# Patient Record
Sex: Female | Born: 1945
Health system: Southern US, Community
[De-identification: ages and names within clinical notes are randomized; demographics above are authoritative.]

## PROBLEM LIST (undated history)

## (undated) DIAGNOSIS — M8580 Other specified disorders of bone density and structure, unspecified site: Secondary | ICD-10-CM

## (undated) DIAGNOSIS — Z923 Personal history of irradiation: Secondary | ICD-10-CM

## (undated) DIAGNOSIS — Z808 Family history of malignant neoplasm of other organs or systems: Secondary | ICD-10-CM

## (undated) DIAGNOSIS — M1990 Unspecified osteoarthritis, unspecified site: Secondary | ICD-10-CM

## (undated) DIAGNOSIS — R519 Headache, unspecified: Secondary | ICD-10-CM

## (undated) DIAGNOSIS — F419 Anxiety disorder, unspecified: Secondary | ICD-10-CM

## (undated) DIAGNOSIS — F32A Depression, unspecified: Secondary | ICD-10-CM

## (undated) DIAGNOSIS — C801 Malignant (primary) neoplasm, unspecified: Secondary | ICD-10-CM

## (undated) DIAGNOSIS — G4700 Insomnia, unspecified: Secondary | ICD-10-CM

## (undated) DIAGNOSIS — K589 Irritable bowel syndrome without diarrhea: Secondary | ICD-10-CM

## (undated) DIAGNOSIS — K635 Polyp of colon: Secondary | ICD-10-CM

## (undated) DIAGNOSIS — I10 Essential (primary) hypertension: Secondary | ICD-10-CM

## (undated) DIAGNOSIS — Z800 Family history of malignant neoplasm of digestive organs: Secondary | ICD-10-CM

## (undated) DIAGNOSIS — C50919 Malignant neoplasm of unspecified site of unspecified female breast: Secondary | ICD-10-CM

## (undated) DIAGNOSIS — K219 Gastro-esophageal reflux disease without esophagitis: Secondary | ICD-10-CM

## (undated) DIAGNOSIS — G8929 Other chronic pain: Secondary | ICD-10-CM

## (undated) DIAGNOSIS — Z803 Family history of malignant neoplasm of breast: Secondary | ICD-10-CM

## (undated) DIAGNOSIS — Z720 Tobacco use: Secondary | ICD-10-CM

## (undated) DIAGNOSIS — R51 Headache: Secondary | ICD-10-CM

## (undated) DIAGNOSIS — F329 Major depressive disorder, single episode, unspecified: Secondary | ICD-10-CM

## (undated) HISTORY — DX: Family history of malignant neoplasm of breast: Z80.3

## (undated) HISTORY — DX: Irritable bowel syndrome, unspecified: K58.9

## (undated) HISTORY — DX: Tobacco use: Z72.0

## (undated) HISTORY — PX: ABDOMINAL HYSTERECTOMY: SHX81

## (undated) HISTORY — DX: Family history of malignant neoplasm of other organs or systems: Z80.8

## (undated) HISTORY — DX: Depression, unspecified: F32.A

## (undated) HISTORY — DX: Headache, unspecified: R51.9

## (undated) HISTORY — DX: Anxiety disorder, unspecified: F41.9

## (undated) HISTORY — DX: Gastro-esophageal reflux disease without esophagitis: K21.9

## (undated) HISTORY — DX: Insomnia, unspecified: G47.00

## (undated) HISTORY — DX: Family history of malignant neoplasm of digestive organs: Z80.0

## (undated) HISTORY — DX: Unspecified osteoarthritis, unspecified site: M19.90

## (undated) HISTORY — PX: ENDOMETRIAL BIOPSY: SHX622

## (undated) HISTORY — DX: Headache: R51

## (undated) HISTORY — DX: Other chronic pain: G89.29

## (undated) HISTORY — DX: Major depressive disorder, single episode, unspecified: F32.9

## (undated) HISTORY — DX: Polyp of colon: K63.5

## (undated) HISTORY — DX: Malignant (primary) neoplasm, unspecified: C80.1

---

## 1982-10-12 HISTORY — PX: LAPAROSCOPY: SHX197

## 1998-07-10 ENCOUNTER — Encounter: Payer: Self-pay | Admitting: Internal Medicine

## 1998-07-19 ENCOUNTER — Ambulatory Visit (HOSPITAL_COMMUNITY): Admission: RE | Admit: 1998-07-19 | Discharge: 1998-07-19 | Payer: Self-pay | Admitting: Internal Medicine

## 1998-09-17 ENCOUNTER — Ambulatory Visit (HOSPITAL_COMMUNITY): Admission: RE | Admit: 1998-09-17 | Discharge: 1998-09-17 | Payer: Self-pay | Admitting: Obstetrics and Gynecology

## 1998-09-17 ENCOUNTER — Encounter: Payer: Self-pay | Admitting: Obstetrics and Gynecology

## 1999-06-09 ENCOUNTER — Other Ambulatory Visit: Admission: RE | Admit: 1999-06-09 | Discharge: 1999-06-09 | Payer: Self-pay | Admitting: Obstetrics and Gynecology

## 1999-06-18 ENCOUNTER — Ambulatory Visit (HOSPITAL_COMMUNITY): Admission: RE | Admit: 1999-06-18 | Discharge: 1999-06-18 | Payer: Self-pay | Admitting: Obstetrics and Gynecology

## 1999-06-18 ENCOUNTER — Encounter: Payer: Self-pay | Admitting: Obstetrics and Gynecology

## 1999-11-11 ENCOUNTER — Encounter: Payer: Self-pay | Admitting: Obstetrics and Gynecology

## 1999-11-11 ENCOUNTER — Ambulatory Visit (HOSPITAL_COMMUNITY): Admission: RE | Admit: 1999-11-11 | Discharge: 1999-11-11 | Payer: Self-pay | Admitting: Obstetrics and Gynecology

## 2000-11-24 ENCOUNTER — Encounter: Payer: Self-pay | Admitting: Obstetrics and Gynecology

## 2000-11-24 ENCOUNTER — Ambulatory Visit (HOSPITAL_COMMUNITY): Admission: RE | Admit: 2000-11-24 | Discharge: 2000-11-24 | Payer: Self-pay | Admitting: Obstetrics and Gynecology

## 2002-02-23 ENCOUNTER — Encounter: Payer: Self-pay | Admitting: Family Medicine

## 2002-02-23 ENCOUNTER — Ambulatory Visit (HOSPITAL_COMMUNITY): Admission: RE | Admit: 2002-02-23 | Discharge: 2002-02-23 | Payer: Self-pay | Admitting: Family Medicine

## 2002-08-08 ENCOUNTER — Other Ambulatory Visit: Admission: RE | Admit: 2002-08-08 | Discharge: 2002-08-08 | Payer: Self-pay | Admitting: Family Medicine

## 2002-09-14 LAB — FECAL OCCULT BLOOD, GUAIAC: Fecal Occult Blood: NEGATIVE

## 2003-02-15 ENCOUNTER — Encounter: Payer: Self-pay | Admitting: Family Medicine

## 2003-02-15 ENCOUNTER — Encounter: Admission: RE | Admit: 2003-02-15 | Discharge: 2003-02-15 | Payer: Self-pay | Admitting: Family Medicine

## 2004-12-25 ENCOUNTER — Ambulatory Visit: Payer: Self-pay | Admitting: Internal Medicine

## 2005-01-02 ENCOUNTER — Ambulatory Visit: Payer: Self-pay | Admitting: Internal Medicine

## 2005-02-17 ENCOUNTER — Ambulatory Visit: Payer: Self-pay | Admitting: Family Medicine

## 2005-05-01 ENCOUNTER — Other Ambulatory Visit: Admission: RE | Admit: 2005-05-01 | Discharge: 2005-05-01 | Payer: Self-pay | Admitting: Family Medicine

## 2005-05-01 ENCOUNTER — Ambulatory Visit: Payer: Self-pay | Admitting: Family Medicine

## 2005-05-26 ENCOUNTER — Encounter: Admission: RE | Admit: 2005-05-26 | Discharge: 2005-05-26 | Payer: Self-pay | Admitting: Family Medicine

## 2005-06-05 ENCOUNTER — Encounter: Admission: RE | Admit: 2005-06-05 | Discharge: 2005-06-05 | Payer: Self-pay | Admitting: Family Medicine

## 2006-05-04 ENCOUNTER — Ambulatory Visit: Payer: Self-pay | Admitting: Family Medicine

## 2006-06-07 ENCOUNTER — Encounter: Admission: RE | Admit: 2006-06-07 | Discharge: 2006-06-07 | Payer: Self-pay | Admitting: Family Medicine

## 2006-06-12 ENCOUNTER — Ambulatory Visit: Payer: Self-pay | Admitting: Family Medicine

## 2006-08-17 ENCOUNTER — Ambulatory Visit: Payer: Self-pay | Admitting: Family Medicine

## 2006-12-26 ENCOUNTER — Emergency Department (HOSPITAL_COMMUNITY): Admission: EM | Admit: 2006-12-26 | Discharge: 2006-12-26 | Payer: Self-pay | Admitting: Family Medicine

## 2007-02-11 ENCOUNTER — Ambulatory Visit: Payer: Self-pay | Admitting: Family Medicine

## 2007-02-11 ENCOUNTER — Encounter: Payer: Self-pay | Admitting: Family Medicine

## 2007-02-11 DIAGNOSIS — F411 Generalized anxiety disorder: Secondary | ICD-10-CM

## 2007-02-11 DIAGNOSIS — G4700 Insomnia, unspecified: Secondary | ICD-10-CM | POA: Insufficient documentation

## 2007-02-11 DIAGNOSIS — F17200 Nicotine dependence, unspecified, uncomplicated: Secondary | ICD-10-CM

## 2007-02-11 DIAGNOSIS — K219 Gastro-esophageal reflux disease without esophagitis: Secondary | ICD-10-CM

## 2007-02-11 DIAGNOSIS — F418 Other specified anxiety disorders: Secondary | ICD-10-CM | POA: Insufficient documentation

## 2007-02-11 DIAGNOSIS — N6019 Diffuse cystic mastopathy of unspecified breast: Secondary | ICD-10-CM

## 2007-05-02 ENCOUNTER — Telehealth: Payer: Self-pay | Admitting: Family Medicine

## 2007-06-10 ENCOUNTER — Encounter: Admission: RE | Admit: 2007-06-10 | Discharge: 2007-06-10 | Payer: Self-pay | Admitting: Family Medicine

## 2007-06-14 ENCOUNTER — Encounter (INDEPENDENT_AMBULATORY_CARE_PROVIDER_SITE_OTHER): Payer: Self-pay | Admitting: *Deleted

## 2007-07-20 ENCOUNTER — Ambulatory Visit: Payer: Self-pay | Admitting: Family Medicine

## 2007-07-20 DIAGNOSIS — M8580 Other specified disorders of bone density and structure, unspecified site: Secondary | ICD-10-CM

## 2007-07-21 LAB — CONVERTED CEMR LAB
ALT: 14 units/L (ref 0–35)
AST: 20 units/L (ref 0–37)
BUN: 11 mg/dL (ref 6–23)
Basophils Absolute: 0.1 10*3/uL (ref 0.0–0.1)
Basophils Relative: 0.8 % (ref 0.0–1.0)
CO2: 32 meq/L (ref 19–32)
Eosinophils Absolute: 0.2 10*3/uL (ref 0.0–0.6)
Eosinophils Relative: 3 % (ref 0.0–5.0)
GFR calc Af Amer: 94 mL/min
GFR calc non Af Amer: 78 mL/min
Glucose, Bld: 104 mg/dL — ABNORMAL HIGH (ref 70–99)
HCT: 42.2 % (ref 36.0–46.0)
Hemoglobin: 14.3 g/dL (ref 12.0–15.0)
LDL Cholesterol: 99 mg/dL (ref 0–99)
Lymphocytes Relative: 21.6 % (ref 12.0–46.0)
MCV: 92.1 fL (ref 78.0–100.0)
Monocytes Absolute: 0.8 10*3/uL — ABNORMAL HIGH (ref 0.2–0.7)
Monocytes Relative: 10.9 % (ref 3.0–11.0)
Neutro Abs: 4.5 10*3/uL (ref 1.4–7.7)
Neutrophils Relative %: 63.7 % (ref 43.0–77.0)
Platelets: 256 10*3/uL (ref 150–400)
RDW: 12.4 % (ref 11.5–14.6)
Sodium: 142 meq/L (ref 135–145)

## 2007-07-28 ENCOUNTER — Encounter: Admission: RE | Admit: 2007-07-28 | Discharge: 2007-07-28 | Payer: Self-pay | Admitting: Family Medicine

## 2007-07-28 ENCOUNTER — Encounter: Payer: Self-pay | Admitting: Family Medicine

## 2007-10-01 ENCOUNTER — Ambulatory Visit: Payer: Self-pay | Admitting: Internal Medicine

## 2008-04-20 ENCOUNTER — Telehealth: Payer: Self-pay | Admitting: Family Medicine

## 2008-06-11 ENCOUNTER — Encounter: Admission: RE | Admit: 2008-06-11 | Discharge: 2008-06-11 | Payer: Self-pay | Admitting: Family Medicine

## 2008-06-13 ENCOUNTER — Encounter (INDEPENDENT_AMBULATORY_CARE_PROVIDER_SITE_OTHER): Payer: Self-pay | Admitting: *Deleted

## 2008-07-12 HISTORY — PX: OTHER SURGICAL HISTORY: SHX169

## 2008-07-17 ENCOUNTER — Ambulatory Visit: Payer: Self-pay | Admitting: Family Medicine

## 2008-07-18 LAB — CONVERTED CEMR LAB
ALT: 14 units/L (ref 0–35)
Alkaline Phosphatase: 72 units/L (ref 39–117)
BUN: 11 mg/dL (ref 6–23)
Basophils Absolute: 0.1 10*3/uL (ref 0.0–0.1)
Bilirubin, Direct: 0.1 mg/dL (ref 0.0–0.3)
Chloride: 104 meq/L (ref 96–112)
Eosinophils Absolute: 0.4 10*3/uL (ref 0.0–0.7)
Eosinophils Relative: 5.6 % — ABNORMAL HIGH (ref 0.0–5.0)
GFR calc Af Amer: 94 mL/min
MCV: 94 fL (ref 78.0–100.0)
Neutro Abs: 3 10*3/uL (ref 1.4–7.7)
Neutrophils Relative %: 46.6 % (ref 43.0–77.0)
Platelets: 231 10*3/uL (ref 150–400)
Sodium: 143 meq/L (ref 135–145)
Total Bilirubin: 0.6 mg/dL (ref 0.3–1.2)
Total CHOL/HDL Ratio: 3.2
Total Protein: 6.5 g/dL (ref 6.0–8.3)

## 2008-07-25 ENCOUNTER — Ambulatory Visit: Payer: Self-pay | Admitting: Family Medicine

## 2008-07-25 DIAGNOSIS — R634 Abnormal weight loss: Secondary | ICD-10-CM

## 2008-07-25 DIAGNOSIS — R109 Unspecified abdominal pain: Secondary | ICD-10-CM | POA: Insufficient documentation

## 2008-07-28 ENCOUNTER — Encounter: Payer: Self-pay | Admitting: Family Medicine

## 2008-08-01 LAB — CONVERTED CEMR LAB
Dopamine 24 Hr Urine: 329 mcg/24hr (ref <500)
Norepinephrine 24 Hr Urine: 91 mcg/24hr — ABNORMAL HIGH (ref <80)

## 2008-08-02 DIAGNOSIS — R930 Abnormal findings on diagnostic imaging of skull and head, not elsewhere classified: Secondary | ICD-10-CM

## 2008-08-07 ENCOUNTER — Ambulatory Visit: Payer: Self-pay | Admitting: Cardiovascular Disease

## 2008-08-22 ENCOUNTER — Ambulatory Visit: Payer: Self-pay | Admitting: Family Medicine

## 2008-08-22 ENCOUNTER — Encounter: Payer: Self-pay | Admitting: Family Medicine

## 2008-08-22 ENCOUNTER — Other Ambulatory Visit: Admission: RE | Admit: 2008-08-22 | Discharge: 2008-08-22 | Payer: Self-pay | Admitting: Family Medicine

## 2008-08-22 LAB — HM PAP SMEAR

## 2009-03-12 ENCOUNTER — Telehealth (INDEPENDENT_AMBULATORY_CARE_PROVIDER_SITE_OTHER): Payer: Self-pay | Admitting: *Deleted

## 2009-03-13 ENCOUNTER — Ambulatory Visit: Payer: Self-pay | Admitting: Family Medicine

## 2009-03-13 DIAGNOSIS — K5289 Other specified noninfective gastroenteritis and colitis: Secondary | ICD-10-CM

## 2009-03-13 DIAGNOSIS — M549 Dorsalgia, unspecified: Secondary | ICD-10-CM | POA: Insufficient documentation

## 2009-03-13 LAB — CONVERTED CEMR LAB
Bilirubin Urine: NEGATIVE
Glucose, Urine, Semiquant: NEGATIVE
Ketones, urine, test strip: NEGATIVE
Nitrite: NEGATIVE
Specific Gravity, Urine: 1.01

## 2009-03-29 ENCOUNTER — Telehealth: Payer: Self-pay | Admitting: Family Medicine

## 2009-06-12 ENCOUNTER — Encounter: Admission: RE | Admit: 2009-06-12 | Discharge: 2009-06-12 | Payer: Self-pay | Admitting: Family Medicine

## 2009-06-14 ENCOUNTER — Encounter (INDEPENDENT_AMBULATORY_CARE_PROVIDER_SITE_OTHER): Payer: Self-pay | Admitting: *Deleted

## 2009-08-07 ENCOUNTER — Ambulatory Visit: Payer: Self-pay | Admitting: Family Medicine

## 2009-08-07 DIAGNOSIS — J069 Acute upper respiratory infection, unspecified: Secondary | ICD-10-CM | POA: Insufficient documentation

## 2009-09-03 ENCOUNTER — Telehealth (INDEPENDENT_AMBULATORY_CARE_PROVIDER_SITE_OTHER): Payer: Self-pay | Admitting: Internal Medicine

## 2009-12-30 ENCOUNTER — Encounter (INDEPENDENT_AMBULATORY_CARE_PROVIDER_SITE_OTHER): Payer: Self-pay | Admitting: *Deleted

## 2010-01-02 ENCOUNTER — Encounter (INDEPENDENT_AMBULATORY_CARE_PROVIDER_SITE_OTHER): Payer: Self-pay | Admitting: *Deleted

## 2010-02-18 DIAGNOSIS — Z860100 Personal history of colon polyps, unspecified: Secondary | ICD-10-CM | POA: Insufficient documentation

## 2010-02-20 ENCOUNTER — Ambulatory Visit: Payer: Self-pay | Admitting: Internal Medicine

## 2010-02-21 ENCOUNTER — Ambulatory Visit: Payer: Self-pay | Admitting: Internal Medicine

## 2010-02-21 LAB — CONVERTED CEMR LAB
AST: 28 units/L (ref 0–37)
BUN: 17 mg/dL (ref 6–23)
Basophils Absolute: 0 10*3/uL (ref 0.0–0.1)
Basophils Relative: 0.4 % (ref 0.0–3.0)
CO2: 33 meq/L — ABNORMAL HIGH (ref 19–32)
Chloride: 100 meq/L (ref 96–112)
Eosinophils Absolute: 0.2 10*3/uL (ref 0.0–0.7)
Eosinophils Relative: 2.8 % (ref 0.0–5.0)
Glucose, Bld: 97 mg/dL (ref 70–99)
Hemoglobin: 16 g/dL — ABNORMAL HIGH (ref 12.0–15.0)
Lymphocytes Relative: 29.9 % (ref 12.0–46.0)
MCHC: 35 g/dL (ref 30.0–36.0)
Monocytes Absolute: 0.6 10*3/uL (ref 0.1–1.0)
Neutrophils Relative %: 57.9 % (ref 43.0–77.0)
Potassium: 4.3 meq/L (ref 3.5–5.1)
RBC: 4.88 M/uL (ref 3.87–5.11)
RDW: 13.3 % (ref 11.5–14.6)
Sodium: 143 meq/L (ref 135–145)
TSH: 1.54 microintl units/mL (ref 0.35–5.50)

## 2010-02-21 LAB — HM COLONOSCOPY

## 2010-02-24 ENCOUNTER — Encounter: Payer: Self-pay | Admitting: Internal Medicine

## 2010-02-24 ENCOUNTER — Telehealth: Payer: Self-pay | Admitting: Internal Medicine

## 2010-06-13 ENCOUNTER — Encounter: Admission: RE | Admit: 2010-06-13 | Discharge: 2010-06-13 | Payer: Self-pay | Admitting: Family Medicine

## 2010-06-18 ENCOUNTER — Encounter: Payer: Self-pay | Admitting: Family Medicine

## 2010-07-21 ENCOUNTER — Ambulatory Visit: Payer: Self-pay | Admitting: Family Medicine

## 2010-07-21 DIAGNOSIS — E7800 Pure hypercholesterolemia, unspecified: Secondary | ICD-10-CM

## 2010-07-21 LAB — CONVERTED CEMR LAB
ALT: 15 units/L (ref 0–35)
Alkaline Phosphatase: 65 units/L (ref 39–117)
BUN: 19 mg/dL (ref 6–23)
Basophils Relative: 0.9 % (ref 0.0–3.0)
Chloride: 103 meq/L (ref 96–112)
Cholesterol: 185 mg/dL (ref 0–200)
GFR calc non Af Amer: 64.73 mL/min (ref >60)
Glucose, Bld: 103 mg/dL — ABNORMAL HIGH (ref 70–99)
HCT: 42.4 % (ref 36.0–46.0)
HDL: 61.8 mg/dL (ref >39.00)
MCHC: 34.8 g/dL (ref 30.0–36.0)
RDW: 13 % (ref 11.5–14.6)
Total Bilirubin: 0.4 mg/dL (ref 0.3–1.2)
Total CHOL/HDL Ratio: 3
Total Protein: 6.4 g/dL (ref 6.0–8.3)
VLDL: 11.6 mg/dL (ref 0.0–40.0)

## 2010-07-22 ENCOUNTER — Telehealth (INDEPENDENT_AMBULATORY_CARE_PROVIDER_SITE_OTHER): Payer: Self-pay | Admitting: *Deleted

## 2010-07-22 LAB — CONVERTED CEMR LAB: Vit D, 25-Hydroxy: 48 ng/mL (ref 30–89)

## 2010-07-24 ENCOUNTER — Ambulatory Visit: Payer: Self-pay | Admitting: Family Medicine

## 2010-07-24 DIAGNOSIS — B351 Tinea unguium: Secondary | ICD-10-CM | POA: Insufficient documentation

## 2010-10-30 ENCOUNTER — Ambulatory Visit
Admission: RE | Admit: 2010-10-30 | Discharge: 2010-10-30 | Payer: Self-pay | Source: Home / Self Care | Attending: Family Medicine | Admitting: Family Medicine

## 2010-10-30 DIAGNOSIS — R300 Dysuria: Secondary | ICD-10-CM | POA: Insufficient documentation

## 2010-10-30 DIAGNOSIS — J019 Acute sinusitis, unspecified: Secondary | ICD-10-CM | POA: Insufficient documentation

## 2010-10-30 DIAGNOSIS — N3000 Acute cystitis without hematuria: Secondary | ICD-10-CM | POA: Insufficient documentation

## 2010-10-30 LAB — CONVERTED CEMR LAB
Bilirubin Urine: NEGATIVE
Glucose, Urine, Semiquant: NEGATIVE
Nitrite: POSITIVE
Protein, U semiquant: 30
Specific Gravity, Urine: <1.005
Urobilinogen, UA: 0.2

## 2010-10-31 ENCOUNTER — Encounter: Payer: Self-pay | Admitting: Family Medicine

## 2010-11-02 ENCOUNTER — Encounter: Payer: Self-pay | Admitting: Family Medicine

## 2010-11-09 LAB — CONVERTED CEMR LAB
Casts: 0 /lpf
Epithelial cells, urine: 1 /lpf
Nitrite: NEGATIVE
Protein, U semiquant: NEGATIVE
Specific Gravity, Urine: <1.005
WBC, UA: 0 cells/hpf
Yeast, UA: 0

## 2010-11-13 NOTE — Progress Notes (Signed)
Summary: Triage   Phone Note Call from Patient Call back at Home Phone [phone]   Caller: Patient Call For: Dr. Juanda Chance Reason for Call: Talk to Nurse Summary of Call: pt. had a procedure on Fri. and is having pain and diarrhea. Wants to know if she can take some Imodium Initial call taken by: Karna Christmas,  Feb 24, 2010 12:27 PM  Follow-up for Phone Call        A.Willis,Rn spoke with patient,still having diarrhea and abd. pain,"everything i eat comes threw liquid form",she is asking if she can take the immodium for diarrhea and tylenol for pain. Patient questions if she should take immodium if biopsies show the microscopic colitis. Told patient will call her back after we hear from Dr.Marquite Attwood and she said we may leave her a message. Follow-up by: Sherren Kerns RN,  Feb 24, 2010 1:42 PM  Additional Follow-up for Phone Call Additional follow up Details #1::        spoke with pt's husband.Day$3 of antibiotics.  OK to take Imodioum, Bx 's no colitis. Call me back by the eand of the week. Consider tTG, ,SBFT etc. Additional Follow-up by: Hart Carwin MD,  Feb 24, 2010 3:24 PM     Appended Document: Triage Called patient to make sure she did not have any other questions after seeing that Dr.Cathrine Krizan did call about her questions. Spoke with Husband explained note that was emr by Dr.Rylei Masella. He seemed to understand. Thank You,Dr.Ziare Orrick. Sherren Kerns RN  Feb 24, 2010 4:18 PM]

## 2010-11-13 NOTE — Letter (Signed)
Summary: [facility] Instructions  [facility]  [address] [place], [place] [address]   Phone: [phone]  Fax: [phone]       Cheryl Aguirre    1946-02-07    MRN: [id number]       Procedure Day /Date: 02/21/10 Friday     Arrival Time: 7:30 am     Procedure Time: 8:00 am     Location of Procedure:                    _x _  [facility] (4th Floor)  PREPARATION FOR COLONOSCOPY WITH MOVIPREP    THE DAY BEFORE YOUR PROCEDURE         DATE: 02/20/10  DAY: Thursday  1.  Drink clear liquids the entire day-NO SOLID FOOD  2.  Do not drink anything colored red or purple.  Avoid juices with pulp.  No orange juice.  3.  Drink at least 64 oz. (8 glasses) of fluid/clear liquids during the day to prevent dehydration and help the prep work efficiently.  CLEAR LIQUIDS INCLUDE: Water Jello Ice Popsicles Tea (sugar ok, no milk/cream) Powdered fruit flavored drinks Coffee (sugar ok, no milk/cream) Gatorade Juice: apple, white grape, white cranberry  Lemonade Clear bullion, consomm, broth Carbonated beverages (any kind) Strained chicken noodle soup Hard Candy                             4.  In the morning, mix first dose of MoviPrep solution:    Empty 1 Pouch A and 1 Pouch B into the disposable container    Add lukewarm drinking water to the top line of the container. Mix to dissolve    Refrigerate (mixed solution should be used within 24 hrs)  5.  Begin drinking the prep at 5:00 p.m. The MoviPrep container is divided by 4 marks.   Every 15 minutes drink the solution down to the next mark (approximately 8 oz) until the full liter is complete.   6.  Follow completed prep with 16 oz of clear liquid of your choice (Nothing red or purple).  Continue to drink clear liquids until bedtime.  7.  Before going to bed, mix second dose of MoviPrep solution:    Empty 1 Pouch A and 1 Pouch B into the disposable container    Add lukewarm drinking water to the  top line of the container. Mix to dissolve    Refrigerate  THE DAY OF YOUR PROCEDURE      DATE: 02/21/10 DAY: Friday  Beginning at 3:00 a.m. (5 hours before procedure):         1. Every 15 minutes, drink the solution down to the next mark (approx 8 oz) until the full liter is complete.  2. Follow completed prep with 16 oz. of clear liquid of your choice.    3. You may drink clear liquids until 6:00 am (2 HOURS BEFORE PROCEDURE).   MEDICATION INSTRUCTIONS  Unless otherwise instructed, you should take regular prescription medications with a small sip of water   as early as possible the morning of your procedure.        OTHER INSTRUCTIONS  You will need a responsible adult at least 64 years of age to accompany you and drive you home.   This person must remain in the waiting room during your procedure.  Wear loose fitting clothing that is easily removed.  Leave jewelry and other  valuables at home.  However, you may wish to bring a book to read or  an iPod/MP3 player to listen to music as you wait for your procedure to start.  Remove all body piercing jewelry and leave at home.  Total time from sign-in until discharge is approximately 2-3 hours.  You should go home directly after your procedure and rest.  You can resume normal activities the  day after your procedure.  The day of your procedure you should not:   Drive   Make legal decisions   Operate machinery   Drink alcohol   Return to work  You will receive specific instructions about eating, activities and medications before you leave.  The above instructions have been reviewed and explained to me by   Lamona Curl CMA Duncan Dull)  Feb 20, 2010 1:57 PM   I fully understand and can verbalize these instructions _____________________________ Date 02/20/10

## 2010-11-13 NOTE — Consult Note (Signed)
Summary: GI Consult/[facility]  GI Consult/[facility]   Imported By: Sherian Rein 02/19/2010 07:07:48  _____________________________________________________________________  External Attachment:    Type:   Image     Comment:   External Document

## 2010-11-13 NOTE — Progress Notes (Signed)
----   Converted from flag ---- ---- 07/20/2010 12:24 PM, Colon Flattery Tower MD wrote: please check lipid and wellness and vit D for v70.0 and 272 and 733.0  ---- 07/18/2010 12:37 PM, Mills Koller wrote: This patient is scheduled for CPX with you, I need lab orders with dx, please. Thanks, Terri ------------------------------

## 2010-11-13 NOTE — Letter (Signed)
Summary: Colonoscopy Letter  [facility]  [address] [place], [place] [address]   Phone: [phone]  Fax: [phone]      December 30, 2009 MRN: [id number]   [facility] [address] [place], [place]  [address]   Dear Ms. Brafford,   According to your medical record, it is time for you to schedule a Colonoscopy. The American Cancer Society recommends this procedure as a method to detect early colon cancer. Patients with a family history of colon cancer, or a personal history of colon polyps or inflammatory bowel disease are at increased risk.  This letter has beeen generated based on the recommendations made at the time of your procedure. If you feel that in your particular situation this may no longer apply, please contact our office.  Please call our office at [phone] to schedule this appointment or to update your records at your earliest convenience.  Thank you for cooperating with [place] to provide you with the very best care possible.   Sincerely,  Hedwig Morton. Juanda Chance, M.D.  [facility] Gastroenterology Division [phone]

## 2010-11-13 NOTE — Letter (Signed)
Summary: New Patient letter  [facility]  [address] [place], [place] [address]   Phone: [phone]  Fax: [phone]       01/02/2010 MRN: [id number]  [facility] [address] [place], [place]  [address]  Dear Cheryl Aguirre,  Welcome to the Gastroenterology Division at [facility].    You are scheduled to see Dr. Juanda Chance on 02/20/2010 at 1:30PM on the 3rd floor at [facility], [address]  We ask that you try to arrive at our office 15 minutes prior to your appointment time to allow for check-in.  We would like you to complete the enclosed self-administered evaluation form prior to your visit and bring it with you on the day of your appointment.  We will review it with you.  Also, please bring a complete list of all your medications or, if you prefer, bring the medication bottles and we will list them.  Please bring your insurance card so that we may make a copy of it.  If your insurance requires a referral to see a specialist, please bring your referral form from your primary care physician.  Co-payments are due at the time of your visit and may be paid by cash, check or credit card.     Your office visit will consist of a consult with your physician (includes a physical exam), any laboratory testing he/she may order, scheduling of any necessary diagnostic testing (e.g. x-ray, ultrasound, CT-scan), and scheduling of a procedure (e.g. Endoscopy, Colonoscopy) if required.  Please allow enough time on your schedule to allow for any/all of these possibilities.    If you cannot keep your appointment, please call [phone] to cancel or reschedule prior to your appointment date.  This allows [place] the opportunity to schedule an appointment for another patient in need of care.  If you do not cancel or reschedule by 5 p.m. the business day prior to your appointment date, you will be charged a $50.00 late cancellation/no-show fee.    Thank you for choosing  [facility] for your medical needs.  We appreciate the opportunity to care for you.  Please visit [place] at our website  to learn more about our practice.                     Sincerely,                                                             The Gastroenterology Division

## 2010-11-13 NOTE — Letter (Signed)
Summary: Results Follow up Letter  [place] [facility]  [address] [place], [place] [address]   Phone: [phone]  Fax: [phone]    06/18/2010 MRN: [id number]    [facility] [address] [place], [place]  [address]    Dear Ms. Parveen,  The following are the results of your recent test(s):  Test         Result    Pap Smear:        Normal _____  Not Normal _____ Comments: ______________________________________________________ Cholesterol: LDL(Bad cholesterol):         Your goal is less than:         HDL (Good cholesterol):       Your goal is more than: Comments:  ______________________________________________________ Mammogram:        Normal _X____  Not Normal _____ Comments:Repeat in one year.   ___________________________________________________________________ Hemoccult:        Normal _____  Not normal _______ Comments:    _____________________________________________________________________ Other Tests:    We routinely do not discuss normal results over the telephone.  If you desire a copy of the results, or you have any questions about this information we can discuss them at your next office visit.   Sincerely,    Idamae Schuller Tower,MD  MT/ri

## 2010-11-13 NOTE — Letter (Signed)
Summary: Patient Notice- Colon Biospy Results  [facility]  [address] [place], [place] [address]   Phone: [phone]  Fax: [phone]        Feb 24, 2010 MRN: [id number]    [facility] [address] [place], [place]  [address]    Dear Ms. Sukup,  I am pleased to inform you that the biopsies taken during your recent colonoscopy did not show any evidence of cancer upon pathologic examination.There is no evidence of colitis. The polyps consisted of normal tissue  Additional information/recommendations:  __No further action is needed at this time.  Please follow-up with      your primary care physician for your other healthcare needs.  _x_Please call [phone] to schedule a return visit to review      your condition.  _x_Continue with the treatment plan as outlined on the day of your      exam.  _x_You should have a repeat colonoscopy examination for this problem           in 10_ years.  Please call us if you are having persistent problems or have questions about your condition that have not been fully answered at this time.  Sincerely,  Hart Carwin MD   This letter has been electronically signed by your physician.  Appended Document: Patient Notice- Colon Biospy Results letter mailed

## 2010-11-13 NOTE — Procedures (Signed)
Summary: COLON   Colonoscopy  Procedure date:  01/02/2005  Findings:      Location:  [facility].    Procedures Next Due Date:    Colonoscopy: 01/2010 Patient Name: Cheryl Aguirre, Cheryl Aguirre MRN:  Procedure Procedures: Colonoscopy CPT: 60454.    with biopsy. CPT: Q5068410.  Personnel: Endoscopist: Mansoor Hillyard L. Juanda Chance, MD.  Exam Location: Exam performed in Outpatient Clinic. Outpatient  Patient Consent: Procedure, Alternatives, Risks and Benefits discussed, consent obtained, from patient. Consent was obtained by the RN.  Indications  Increased Risk Screening: For family history of colorectal neoplasia, in  parent maternal aunt  History  Current Medications: Patient is not currently taking Coumadin.  Pre-Exam Physical: Performed Jan 02, 2005. Entire physical exam was normal.  Exam Exam: Extent of exam reached: Cecum, extent intended: Cecum.  The cecum was identified by appendiceal orifice and IC valve. Colon retroflexion performed. Images taken. ASA Classification: I. Tolerance: good.  Monitoring: Pulse and BP monitoring, Oximetry used. Supplemental O2 given.  Colon Prep Used Miralax for colon prep. Prep results: good.  Sedation Meds: Patient assessed and found to be appropriate for moderate (conscious) sedation. Fentanyl 100 mcg. given IV. Versed 10 mg. given IV.  Findings - NORMAL EXAM: Cecum.  - MULTIPLE POLYPS: Rectum. minimum size 2 mm, maximum size 3 mm. Procedure:  biopsy without cautery, removed, Polyp retrieved, 2 polyps Polyps sent to pathology. ICD9: Colon Polyps: 211.3.   Assessment Abnormal examination, see findings above.  Diagnoses: 211.3: Colon Polyps.   Comments: diminutive polyps removed Events  Unplanned Interventions: No intervention was required.  Unplanned Events: There were no complications. Plans Medication Plan: Await pathology.  Patient Education: Patient given standard instructions for: Yearly hemoccult  testing recommended. Patient instructed to get routine colonoscopy every 5 years.  Disposition: After procedure patient sent to recovery. After recovery patient sent home.   This report was created from the original endoscopy report, which was reviewed and signed by the above listed endoscopist.

## 2010-11-13 NOTE — Procedures (Signed)
Summary: Colonoscopy  Patient: Cheryl Aguirre Note: All result statuses are Final unless otherwise noted.  Tests: (1) Colonoscopy (COL)   COL Colonoscopy           DONE     [facility]     [address]     [place], [place]  [address]           COLONOSCOPY PROCEDURE REPORT           PATIENT:  Cheryl, Aguirre  MR#:  [id number]     BIRTHDATE:  October 12, 1946, 63 yrs. old  GENDER:  female     ENDOSCOPIST:  Hedwig Morton. Juanda Chance, MD     REF. BY:  Marne A. Milinda Antis, M.D.     PROCEDURE DATE:  02/21/2010     PROCEDURE:  Colonoscopy 84696     ASA CLASS:  Class I     INDICATIONS:  Elevated Risk Screening acute diarrheax 3 weeks     father with colon cancer age 44     last colon 2006 hyperplastic opolyps ( prior colon 1993)     MEDICATIONS:   Versed 9 mg, Fentanyl 75 mcg           DESCRIPTION OF PROCEDURE:   After the risks benefits and     alternatives of the procedure were thoroughly explained, informed     consent was obtained.  Digital rectal exam was performed and     revealed no rectal masses.   The LB [id number]-[id number] endoscope     was introduced through the anus and advanced to the cecum, which     was identified by both the appendix and ileocecal valve, without     limitations.  The quality of the prep was good, using MiraLax.     The instrument was then slowly withdrawn as the colon was fully     examined.     <<PROCEDUREIMAGES>>           FINDINGS:  Mild diverticulosis was found in the sigmoid colon (see     image5).  A sessile polyp was found. at 40 cm 5 mm polyp The polyp     was removed using cold biopsy forceps (see image4 and image3).     This was otherwise a normal examination of the colon. r/o     microscopic colitis Random biopsies were obtained and sent to     pathology (see image1, image2, and image7).   Retroflexed views in     the rectum revealed no abnormalities.    The scope was then     withdrawn from the patient and the procedure completed.        COMPLICATIONS:  None     ENDOSCOPIC IMPRESSION:     1) Mild diverticulosis in the sigmoid colon     2) Sessile polyp     3) Otherwise normal examination     s/p random biopsies to r/o microscopic colitis     RECOMMENDATIONS:     1) Await pathology results     begin Flagyl 250 mg po tid x 1 week     Cipro 250 mg po bidx 1 week     low residue diet     REPEAT EXAM:  In 5 year(s) for.           ______________________________     Hedwig Morton. Juanda Chance, MD           CC:  n.     eSIGNED:   Hedwig Morton. Brodie at 02/21/2010 08:41 AM           Azell Der, [id number]  Note: An exclamation mark (!) indicates a result that was not dispersed into the flowsheet. Document Creation Date: 02/21/2010 8:41 AM _______________________________________________________________________  (1) Order result status: Final Collection or observation date-time: 02/21/2010 08:32 Requested date-time:  Receipt date-time:  Reported date-time:  Referring Physician:   Ordering Physician: Lina Sar [phone]) Specimen Source:  Source: Launa Grill Order Number: [id number] Lab site:   Appended Document: Colonoscopy recall in 10 yrs     Procedures Next Due Date:    Colonoscopy: 02/2020

## 2010-11-13 NOTE — Assessment & Plan Note (Signed)
Summary: uti/alc   Vital Signs:  Patient profile:   65 year old female Height:      63.75 inches Weight:      128.50 pounds BMI:     22.31 Temp:     97.9 degrees F oral Pulse rate:   76 / minute Pulse rhythm:   regular BP sitting:   120 / 70  (left arm) Cuff size:   regular  Vitals Entered By: Benny Lennert CMA (AAMA) (October 30, 2010 11:00 AM)  History of Present Illness: Chief complaint ? uti  abdominal pain on the right side pain pressure with burning.  dysuria, feeling like needs to go but cannot aching all over  head hurts really bad R sinus tenderness, has severe allergies at baseline.   Acute Visit History:      The patient complains of sinus problems.  She complains of sinus pressure, teeth aching, ears being blocked, nasal congestion, purulent drainage, and frontal headache.  The patient has had a past history of sinusitis.        Allergies: 1)  ! Penicillin G Potassium (Penicillin G Potassium) 2)  ! * Vi Q Tuss 3)  ! Doxycycline 4)  ! * Chantix 5)  ! Fosamax (Alendronate Sodium)  Past History:  Past medical, surgical, family and social histories (including risk factors) reviewed, and no changes noted (except as noted below).  Past Medical History: Reviewed history from 02/20/2010 and no changes required. Anxiety Depression/ anx  GERD tab abuse  insomnia  Arthritis Chronic Headaches Irritable Bowel Syndrome Colon Polyps  Past Surgical History: Reviewed history from 07/24/2010 and no changes required. hysterectomy- for bleeding and fibroid and endometriosis (was total hyst)  laparoscopy-1984  CT chest 10/09 bronchiectasis   Family History: Reviewed history from 02/20/2010 and no changes required. cousin ?cva PGM- thyroid ca 2 sisters with thyroid problems Family History of Breast Cancer:Maternal Aunt, Cousin Family History of Colon Cancer: Father, Paternal Grandmother Family History of Colon Polyps:Sister Family History of Pancreatic  Cancer: Mother Family History of Diabetes: Cousin Family History of Heart Disease: Aunts, Uncles Liver Cancer: Mother Family History of Irritable Bowel Syndrome:Sister   Social History: Reviewed history from 02/20/2010 and no changes required. Unemployed Married One child Patient currently smokes.  Alcohol Use - no Daily Caffeine Use: 2 daily  Illicit Drug Use - no  Review of Systems       REVIEW OF SYSTEMS GEN: Acute illness details above. CV: No chest pain or SOB GI: No noted N or V Otherwise, pertinent positives and negatives are noted in the HPI.   Physical Exam  General:  Well-developed,well-nourished,in no acute distress; alert,appropriate and cooperative throughout examination Head:  normocephalic and atraumatic.  TTP R frontal and max sinuses Ears:  External ear exam shows no significant lesions or deformities.  Otoscopic examination reveals clear canals, tympanic membranes are intact bilaterally without bulging, retraction, inflammation or discharge. Hearing is grossly normal bilaterally. Nose:  External nasal examination shows no deformity or inflammation. Nasal mucosa are pink and moist without lesions or exudates. Mouth:  Oral mucosa and oropharynx without lesions or exudates.  Teeth in good repair. Neck:  No deformities, masses, or tenderness noted. Lungs:  Normal respiratory effort, chest expands symmetrically. Lungs are clear to auscultation, no crackles or wheezes. Heart:  Normal rate and regular rhythm. S1 and S2 normal without gallop, murmur, click, rub or other extra sounds. Abdomen:  suprapubic and RLQ abd pain soft, normal bowel sounds, no distention, no masses, no guarding, no  rigidity, no rebound tenderness, no hepatomegaly, and no splenomegaly.   Psych:  Cognition and judgment appear intact. Alert and cooperative with normal attention span and concentration. No apparent delusions, illusions, hallucinations   Impression & Recommendations:  Problem # 1:   ACUTE CYSTITIS (ICD-595.0) Assessment New cover UTI and sinusitis with LVQ  Her updated medication list for this problem includes:    Levofloxacin 500 Mg Tabs (Levofloxacin) .Marland Kitchen... 1 by mouth daily  Orders: Venipuncture (78295) T-Culture, Urine (62130-86578) Specimen Handling (46962)  Encouraged to push clear liquids, get enough rest, and take acetaminophen as needed. To be seen in 10 days if no improvement, sooner if worse.  Problem # 2:  SINUSITIS - ACUTE-NOS (ICD-461.9) Assessment: New  Her updated medication list for this problem includes:    Levofloxacin 500 Mg Tabs (Levofloxacin) .Marland Kitchen... 1 by mouth daily  Instructed on treatment. Call if symptoms persist or worsen.   Problem # 3:  DYSURIA (ICD-788.1)  Her updated medication list for this problem includes:    Levofloxacin 500 Mg Tabs (Levofloxacin) .Marland Kitchen... 1 by mouth daily  Orders: UA Dipstick w/o Micro (manual) (95284)  Complete Medication List: 1)  Multiple Vitamin Tabs (Multiple vitamin) .... Take by mouth as directed 2)  Glucosamine Caps (Glucosamine sulfate caps) .... Take by mouth 3-4 times a week 3)  Melatonin Tabs (Melatonin tabs) .... Take as directed by mouth at bedtime 4)  Lexapro 20 Mg Tabs (Escitalopram oxalate) .Marland Kitchen.. 1 by mouth once daily 5)  Zyrtec Allergy 10 Mg Tabs (Cetirizine hcl) .Marland Kitchen.. 1 by mouth once daily 6)  Phenylephrine - Otc Decongestant  .... As needed up to every  4 hours as 7)  Singulair 10 Mg Tabs (Montelukast sodium) .... Take 1 once daily 8)  Citracal/vitamin D 250-200 Mg-unit Tabs (Calcium citrate-vitamin d) .... One tablet by mouth once daily 9)  Acidophilus Caps (Lactobacillus) .... One capsule by mouth once daily 10)  Tums 500 Mg Chew (Calcium carbonate antacid) .... As needed 11)  Bentyl 10 Mg Caps (Dicyclomine hcl) .... Take 1 capsule by mouth three times a day before meals as needed 12)  Omega-3 350 Mg Caps (Omega-3 fatty acids) .... Otc as directed. 13)  Coq10 ?mg  .... Take 1 tablet by  mouth once a day 14)  Vitamin E 1000 Unit Caps (Vitamin e) .... Take 1 capsule by mouth once a day 15)  Kinko Otc  .... Otc as directed. 16)  Levofloxacin 500 Mg Tabs (Levofloxacin) .Marland Kitchen.. 1 by mouth daily Prescriptions: LEVOFLOXACIN 500 MG TABS (LEVOFLOXACIN) 1 by mouth daily  #7 x 0   Entered and Authorized by:   Hannah Beat MD   Signed by:   Hannah Beat MD on 10/30/2010   Method used:   [facility]        [facility] [id number]       [address]       [place], [place]  [address]       Ph: [phone] or [phone]       Fax: [phone]   RxID:   [id number]    Orders Added: 1)  UA Dipstick w/o Micro (manual) [81002] 2)  Venipuncture [32355] 3)  T-Culture, Urine [73220-25427] 4)  Specimen Handling [99000] 5)  Est. Patient Level IV [06237]    Current Allergies (reviewed today): ! PENICILLIN G POTASSIUM (PENICILLIN G POTASSIUM) ! * VI Q TUSS ! DOXYCYCLINE ! * CHANTIX ! FOSAMAX (ALENDRONATE SODIUM)  Laboratory Results   Urine Tests  Date/Time Received:  October 30, 2010 11:09 AM  Date/Time Reported: October 30, 2010 11:09 AM   Routine Urinalysis   Color: yellow Appearance: Cloudy Glucose: negative   (Normal Range: Negative) Bilirubin: negative   (Normal Range: Negative) Ketone: negative   (Normal Range: Negative) Spec. Gravity: <1.005   (Normal Range: 1.003-1.035) Blood: large   (Normal Range: Negative) pH: 7.0   (Normal Range: 5.0-8.0) Protein: 30   (Normal Range: Negative) Urobilinogen: 0.2   (Normal Range: 0-1) Nitrite: positive   (Normal Range: Negative) Leukocyte Esterace: large   (Normal Range: Negative)

## 2010-11-13 NOTE — Assessment & Plan Note (Signed)
Summary: DIARRHEA...AS.    History of Present Illness Visit Type: new patient  Primary GI MD: Lina Sar MD Primary Provider: Roxy Manns, MD Requesting Provider: n/a Chief Complaint: LLQ abd pain, bloating, belching, acid reflux, diarrhea, and fecal incontinence History of Present Illness:   This is a 65 year old white female with an acute diarrheal illness which started 3 weeks ago. She had an abrupt onset of watery and soft stools but she has now improved to having 3-4 loose stools daily. She has not had any normal bowel movements for 3 weeks. Prior bowel habits were one bowel movement a day. She has a family history of colon cancer in her father at the age of 11 and her last colonoscopy in 2006 showed a hyperplastic polyp. A prior colonoscopy was in 30 in [place]. She is a smoker and has gastroesophageal reflux. She has tried acidophilus. No recent antibiotics.   GI Review of Systems    Reports abdominal pain, acid reflux, belching, bloating, and  heartburn.     Location of  Abdominal pain: LLQ.    Denies chest pain, dysphagia with liquids, dysphagia with solids, loss of appetite, nausea, vomiting, vomiting blood, weight loss, and  weight gain.      Reports change in bowel habits, diarrhea, fecal incontinence, hemorrhoids, irritable bowel syndrome, and  rectal pain.      Current Medications (verified): 1)  Multiple Vitamin  Tabs (Multiple Vitamin) .... Take By Mouth As Directed 2)  Glucosamine  Caps (Glucosamine Sulfate Caps) .... Take By Mouth 3-4 Times A Week 3)  Melatonin  Tabs (Melatonin Tabs) .... Take As Directed By Mouth Qhs 4)  Calcium  Tabs (Calcium Carbonate-Vitamin D Tabs) .... Take By Mouth As Directed 5)  Lexapro 20 Mg  Tabs (Escitalopram Oxalate) .Marland Kitchen.. 1 By Mouth Once Daily 6)  Zyrtec Allergy 10 Mg Tabs (Cetirizine Hcl) .Marland Kitchen.. 1 By Mouth Once Daily 7)  Phenylephrine - Otc Decongestant .... As Needed Up To Every  4 Hours As 8)  Singulair 10 Mg Tabs (Montelukast  Sodium) .... Take 1 Once Daily 9)  Citracal/vitamin D 250-200 Mg-Unit Tabs (Calcium Citrate-Vitamin D) .... One Tablet By Mouth Once Daily 10)  Acidophilus  Caps (Lactobacillus) .... One Capsule By Mouth Once Daily 11)  Tums 500 Mg Chew (Calcium Carbonate Antacid) .... As Needed  Allergies (verified): 1)  ! Penicillin G Potassium (Penicillin G Potassium) 2)  ! * Vi Q Tuss 3)  ! Doxycycline 4)  ! * Chantix 5)  ! Fosamax (Alendronate Sodium)  Past History:  Past Medical History: Anxiety Depression/ anx  GERD tab abuse  insomnia  Arthritis Chronic Headaches Irritable Bowel Syndrome Colon Polyps  Past Surgical History: Reviewed history from 02/18/2010 and no changes required. hysterectomy- for bleeding  laparoscopy-1984  CT chest 10/09 bronchiectasis   Family History: cousin ?cva PGM- thyroid ca 2 sisters with thyroid problems Family History of Breast Cancer:Maternal Aunt, Cousin Family History of Colon Cancer: Father, Paternal Grandmother Family History of Colon Polyps:Sister Family History of Pancreatic Cancer: Mother Family History of Diabetes: Cousin Family History of Heart Disease: Aunts, Uncles Liver Cancer: Mother Family History of Irritable Bowel Syndrome:Sister   Social History: Unemployed Married One child Patient currently smokes.  Alcohol Use - no Daily Caffeine Use: 2 daily  Illicit Drug Use - no  Review of Systems       The patient complains of allergy/sinus, anxiety-new, arthritis/joint pain, back pain, cough, depression-new, fatigue, headaches-new, itching, muscle pains/cramps, night sweats, skin rash, swollen  lymph glands, thirst - excessive, urination - excessive, urination changes/pain, and urine leakage.  The patient denies anemia, blood in urine, breast changes/lumps, change in vision, confusion, coughing up blood, fainting, fever, hearing problems, heart murmur, heart rhythm changes, menstrual pain, nosebleeds, pregnancy symptoms, shortness of  breath, sleeping problems, sore throat, swelling of feet/legs, thirst - excessive , urination - excessive , vision changes, and voice change.         Pertinent positive and negative review of systems were noted in the above HPI. All other ROS was otherwise negative.   Vital Signs:  Patient profile:   65 year old female Height:      63.25 inches Weight:      129 pounds BMI:     22.75 BSA:     1.61 Pulse rate:   68 / minute Pulse rhythm:   regular BP sitting:   122 / 64  (left arm) Cuff size:   regular  Vitals Entered By: Ok Anis CMA (Feb 20, 2010 1:34 PM)  Physical Exam  General:  healthy-appearing and in no distress. Eyes:  PERRLA, no icterus. Mouth:  No deformity or lesions, dentition normal. Neck:  Supple; no masses or thyromegaly. Lungs:  Clear throughout to auscultation. Heart:  Regular rate and rhythm; no murmurs, rubs,  or bruits. Abdomen:  hyperactive bowel sounds, soft mildly tympanitic abdomen with mild distention. No tenderness. Liver edge at costal margin. Rectal:  normal rectal tones with soft Hemoccult-negative. Msk:  Symmetrical with no gross deformities. Normal posture. Extremities:  No clubbing, cyanosis, edema or deformities noted. Skin:  Intact without significant lesions or rashes. Psych:  Alert and cooperative. Normal mood and affect.   Impression & Recommendations:  Problem # 1:  GASTROENTERITIS (ICD-558.9) Patient has an acute diarrheal illness suggestive of either acute colitis or infectious enteritis. We will give her a trial of Cipro 250 mg p.o. b.i.d. and Flagyl 250 mg 3 times a day for week. She is due for a colonoscopy. She will be on a low-residue diet and we will also give her Bentyl 10 mg 3 times a day.  Problem # 2:  COLONIC POLYPS, HYPERPLASTIC, HX OF (ICD-V12.72) Patient has a family history of colon cancer and a personal history of colon polyps. She is due for a colonoscopy at this time. We will schedule that for  her.  Orders: Colonoscopy (Colon)  Other Orders: TLB-CBC Platelet - w/Differential (85025-CBCD) TLB-TSH (Thyroid Stimulating Hormone) (84443-TSH) TLB-CMP (Comprehensive Metabolic Pnl) (80053-COMP) TLB-Sedimentation Rate (ESR) (85652-ESR)  Patient Instructions: 1)  Bentyl 10 mg p.o. t.i.d. 2)  Flagyl 250 mg 3 times a day x 1 week. 3)  Cipro 250 mg 2 b.i.d. x1 week . 4)  Low residue diet . 5)  Colonoscopy and biopsies will be completed tommorrow to r/o microscopic colitis. 6)  CBC,C-MET, Sedimentation Rate, TSH 7)  Copy sent to : Dr Judie Petit.Tower 8)  The medication list was reviewed and reconciled.  All changed / newly prescribed medications were explained.  A complete medication list was provided to the patient / caregiver. Prescriptions: CIPRO 250 MG TABS (CIPROFLOXACIN HCL) take 1 tablet by mouth two times a day x 7 days (pharmacy, please disregard prescription for cipro 200 mg)  #14 x 0   Entered by:   Lamona Curl CMA (AAMA)   Authorized by:   Hart Carwin MD   Signed by:   Lamona Curl CMA (AAMA) on 02/20/2010   Method used:   [facility] [id number]       [address]       [place], [place]  [address]       Ph: [phone] or [phone]       Fax: [phone]   RxID:   [id number] BENTYL 10 MG CAPS (DICYCLOMINE HCL) Take 1 capsule by mouth three times a day before meals  #90 x 2   Entered by:   Lamona Curl CMA (AAMA)   Authorized by:   Hart Carwin MD   Signed by:   Lamona Curl CMA (AAMA) on 02/20/2010   Method used:   [facility]        [facility] [id number]       [address]       [place], [place]  [address]       Ph: [phone] or [phone]       Fax: [phone]   RxID:   [id number] FLAGYL 250 MG TABS (METRONIDAZOLE) Take 1 tablet by mouth three times a day x 7 days  #21 x 0   Entered by:   Lamona Curl CMA (AAMA)   Authorized by:   Hart Carwin MD    Signed by:   Lamona Curl CMA (AAMA) on 02/20/2010   Method used:   [facility]        [facility] [id number]       [address]       [place], [place]  [address]       Ph: [phone] or [phone]       Fax: [phone]   RxID:   [id number] CIPRO 200 MG SOLN (CIPROFLOXACIN) Take 1 tablet by mouth two times a day x 7 days  #14 x 0   Entered by:   Lamona Curl CMA (AAMA)   Authorized by:   Hart Carwin MD   Signed by:   Lamona Curl CMA (AAMA) on 02/20/2010   Method used:   [facility]        [facility] [address]       [address]       [place], [place]  [address]       Ph: [phone] or [phone]       Fax: [phone]   RxID:   [id number]  of note: prescription for cipro 200 was sent to pharmacy in error. I have called to correct that. It should be Cipro 250 mg. Dottie Nelson-Smith CMA Duncan Dull)  Feb 20, 2010 2:22 PM

## 2010-11-13 NOTE — Assessment & Plan Note (Signed)
Summary: CPX   Vital Signs:  Patient profile:   65 year old female Height:      63.75 inches Weight:      131.25 pounds BMI:     22.79 Temp:     98 degrees F oral Pulse rate:   76 / minute Pulse rhythm:   regular BP sitting:   102 / 64  (left arm) Cuff size:   regular  Vitals Entered By: Lewanda Rife LPN (July 24, 2010 10:33 AM) CC: CPX   History of Present Illness: here for wellness visit and to disc chronic med problems  is doing good overall   has had a cold and allergies for 2 weeks  more joint pain in past 2 years      bp is 102/64  tab-- is still a little bit  is interested in nicotine gum- may get some   colonosc 5/11- rec re check 10 y was nl  past hx of polyp  hyst in past -- for bleeding , fibroids / endometriosis -- was total  pap09  mam 9/11 self exam   dexa abn in 01-- she does not want to get it until next fall  takes ca and vit D    D level is 48- good   lipids fair with trig 58 and HDLof 61 and LDL 112 is really good with her diet    td was 2000-- will update  [facility]  [facility]  thinks she may have fungal toe and fingernails again  Allergies: 1)  ! Penicillin G Potassium (Penicillin G Potassium) 2)  ! * Vi Q Tuss 3)  ! Doxycycline 4)  ! * Chantix 5)  ! Fosamax (Alendronate Sodium)  Past History:  Past Medical History: Last updated: 02/20/2010 Anxiety Depression/ anx  GERD tab abuse  insomnia  Arthritis Chronic Headaches Irritable Bowel Syndrome Colon Polyps  Family History: Last updated: 02/20/2010 cousin ?cva PGM- thyroid ca 2 sisters with thyroid problems Family History of Breast Cancer:Maternal Aunt, Cousin Family History of Colon Cancer: Father, Paternal Grandmother Family History of Colon Polyps:Sister Family History of Pancreatic Cancer: Mother Family History of Diabetes: Cousin Family History of Heart Disease: Aunts, Uncles Liver Cancer: Mother Family History of  Irritable Bowel Syndrome:Sister   Social History: Last updated: 02/20/2010 Unemployed Married One child Patient currently smokes.  Alcohol Use - no Daily Caffeine Use: 2 daily  Illicit Drug Use - no  Risk Factors: Smoking Status: current (02/20/2010)  Past Surgical History: hysterectomy- for bleeding and fibroid and endometriosis (was total hyst)  laparoscopy-1984  CT chest 10/09 bronchiectasis   Review of Systems General:  Denies fatigue and malaise. Eyes:  Denies blurring and eye irritation. CV:  Denies chest pain or discomfort, palpitations, and shortness of breath with exertion. Resp:  Denies cough and wheezing. GI:  Denies abdominal pain, bloody stools, change in bowel habits, and nausea. GU:  Denies dysuria and urinary frequency. MS:  Complains of joint pain and stiffness; denies joint redness and joint swelling. Derm:  Denies itching, lesion(s), poor wound healing, and rash. Neuro:  Denies numbness and tingling. Psych:  Denies anxiety and depression. Endo:  Denies excessive thirst and excessive urination. Heme:  Denies abnormal bruising and bleeding.  Physical Exam  General:  Well-developed,well-nourished,in no acute distress; alert,appropriate and cooperative throughout examination Head:  normocephalic, atraumatic, and no abnormalities observed.   Eyes:  vision grossly intact, pupils equal, pupils round, and pupils reactive to light.  no conjunctival pallor, injection or icterus  Ears:  R ear normal and L ear normal.   Nose:  no nasal discharge.   Mouth:  pharynx pink and moist.   Neck:  supple with full rom and no masses or thyromegally, no JVD or carotid bruit  Chest Wall:  No deformities, masses, or tenderness noted. Breasts:  No mass, nodules, thickening, tenderness, bulging, retraction, inflamation, nipple discharge or skin changes noted.   Lungs:  Normal respiratory effort, chest expands symmetrically. Lungs are clear to auscultation, no crackles or  wheezes. Heart:  Normal rate and regular rhythm. S1 and S2 normal without gallop, murmur, click, rub or other extra sounds. Abdomen:  Bowel sounds positive,abdomen soft and non-tender without masses, organomegaly or hernias noted.  no renal bruits  Msk:  No deformity or scoliosis noted of thoracic or lumbar spine.  no acute joint changes or tendernes  Pulses:  R and L carotid,radial,femoral,dorsalis pedis and posterior tibial pulses are full and equal bilaterally Extremities:  No clubbing, cyanosis, edema, or deformity noted with normal full range of motion of all joints.   Neurologic:  sensation intact to light touch, gait normal, and DTRs symmetrical and normal.   Skin:  R middle fingernail- thickened L 1st toenail - thickened at lateral corner and cut back  R 2nd toenail thickened but not discolored  Cervical Nodes:  No lymphadenopathy noted Inguinal Nodes:  No significant adenopathy Psych:  normal affect, talkative and pleasant    Impression & Recommendations:  Problem # 1:  HEALTH MAINTENANCE EXAM (ICD-V70.0) Assessment Comment Only reviewed health habits including diet, exercise and skin cancer prevention reviewed health maintenance list and family history  Td today she will get flu and pneumovax [facility] at [facility]  Problem # 2:  OSTEOPENIA (ICD-733.90) Assessment: Unchanged pt agreeable to dexa next fall  intol to fosamax The following medications were removed from the medication list:    Calcium Tabs (Calcium carbonate-vitamin d tabs) .Marland Kitchen... Take by mouth as directed Her updated medication list for this problem includes:    Citracal/vitamin D 250-200 Mg-unit Tabs (Calcium citrate-vitamin d) ..... One tablet by mouth once daily  Problem # 3:  Hx of TOBACCO ABUSE (ICD-305.1) Assessment: Unchanged discussed in detail risks of smoking, and possible outcomes including COPD, vascular dz, cancer and also respiratory infections/sinus problems  pt is semi motivated - may try nicotine  gum  Problem # 4:  ONYCHOMYCOSIS (ICD-110.1) Assessment: New possible on several finger and toenails pt will f/u with her dermatologist for this   Complete Medication List: 1)  Multiple Vitamin Tabs (Multiple vitamin) .... Take by mouth as directed 2)  Glucosamine Caps (Glucosamine sulfate caps) .... Take by mouth 3-4 times a week 3)  Melatonin Tabs (Melatonin tabs) .... Take as directed by mouth at bedtime 4)  Lexapro 20 Mg Tabs (Escitalopram oxalate) .Marland Kitchen.. 1 by mouth once daily 5)  Zyrtec Allergy 10 Mg Tabs (Cetirizine hcl) .Marland Kitchen.. 1 by mouth once daily 6)  Phenylephrine - Otc Decongestant  .... As needed up to every  4 hours as 7)  Singulair 10 Mg Tabs (Montelukast sodium) .... Take 1 once daily 8)  Citracal/vitamin D 250-200 Mg-unit Tabs (Calcium citrate-vitamin d) .... One tablet by mouth once daily 9)  Acidophilus Caps (Lactobacillus) .... One capsule by mouth once daily 10)  Tums 500 Mg Chew (Calcium carbonate antacid) .... As needed 11)  Bentyl 10 Mg Caps (Dicyclomine hcl) .... Take 1 capsule by mouth three times a day before meals as  needed 12)  Omega-3 350 Mg Caps (Omega-3 fatty acids) .... Otc as directed. 13)  Coq10 ?mg  .... Take 1 tablet by mouth once a day 14)  Vitamin E 1000 Unit Caps (Vitamin e) .... Take 1 capsule by mouth once a day 15)  Kinko Otc  .... Otc as directed.  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 PENICILLIN G POTASSIUM (PENICILLIN G POTASSIUM) ! * VI Q TUSS ! DOXYCYCLINE ! * CHANTIX ! FOSAMAX  (ALENDRONATE SODIUM)     Tetanus/Td Vaccine    Vaccine Type: Td    Site: left deltoid    Mfr: Sanofi Pasteur    Dose: 0.5 ml    Route: IM    Given by: Lewanda Rife LPN    Exp. Date: 11/13/2011    Lot #: [id number]    VIS given: 08/29/08 version given July 24, 2010.

## 2011-01-06 ENCOUNTER — Telehealth: Payer: Self-pay | Admitting: *Deleted

## 2011-01-06 NOTE — Telephone Encounter (Signed)
Pt states she takes Singulair for indoor allergies at night and takes Zyrtec in AM. Pt has taken Claritin and Allegra D and both of those meds stopped helping her symptoms. Pt takes Singulair for allergies.

## 2011-01-06 NOTE — Telephone Encounter (Signed)
Ok- form done in IN box

## 2011-01-06 NOTE — Telephone Encounter (Signed)
Prior Cheryl Aguirre is needed for singulair, form is on your shelf.

## 2011-01-06 NOTE — Telephone Encounter (Signed)
Please ask her if taking singulair for allergies or asthma or both  If so -- I need a list of all other meds tried for these conditions  I will hold form for response--thanks

## 2011-01-08 NOTE — Telephone Encounter (Signed)
Completed forms faxed to [phone] as instructed. Copies of forms are at my desk and forms sent for scanning.

## 2011-02-23 ENCOUNTER — Other Ambulatory Visit: Payer: Self-pay | Admitting: Neurology

## 2011-02-23 DIAGNOSIS — R51 Headache: Secondary | ICD-10-CM

## 2011-02-27 ENCOUNTER — Other Ambulatory Visit (HOSPITAL_COMMUNITY): Payer: Self-pay

## 2011-02-27 NOTE — Assessment & Plan Note (Signed)
[facility]                                 ON-CALL NOTE   NAME:Lichtman, Ermal                      MRN:          [id number]  DATE:12/25/2006                            DOB:          April 06, 1946    PRIMARY CARE PHYSICIAN:  Marne A. Tower, M.D.   Ms. Mainor called in today stating that she has had a severe sore  throat.   PLAN:  The patient was advised to be seen at [facility] for  further evaluation.     Leanne Chang, M.D.  Electronically Signed    LA/MedQ  DD: 12/25/2006  DT: 12/26/2006  Job #: [id number]

## 2011-03-17 ENCOUNTER — Other Ambulatory Visit: Payer: Self-pay | Admitting: *Deleted

## 2011-03-17 MED ORDER — ESCITALOPRAM OXALATE 20 MG PO TABS: 20.0000 mg | ORAL_TABLET | Freq: Every day | ORAL | Status: DC

## 2011-03-17 MED ORDER — MONTELUKAST SODIUM 10 MG PO TABS: 10.0000 mg | ORAL_TABLET | Freq: Every day | ORAL | Status: DC

## 2011-03-17 NOTE — Telephone Encounter (Signed)
Left message for pt to call back. [facility] [phone] as instructed.

## 2011-03-17 NOTE — Telephone Encounter (Signed)
Px printed for pick up in IN box  

## 2011-03-17 NOTE — Telephone Encounter (Signed)
[facility]ants [facility] fax # [phone]

## 2011-03-18 NOTE — Telephone Encounter (Signed)
Patient notified as instructed by telephone. 

## 2011-05-23 ENCOUNTER — Emergency Department: Payer: Self-pay | Admitting: Emergency Medicine

## 2011-05-28 ENCOUNTER — Other Ambulatory Visit: Payer: Self-pay | Admitting: Family Medicine

## 2011-05-28 DIAGNOSIS — Z1231 Encounter for screening mammogram for malignant neoplasm of breast: Secondary | ICD-10-CM

## 2011-06-11 ENCOUNTER — Telehealth: Payer: Self-pay

## 2011-06-11 DIAGNOSIS — M899 Disorder of bone, unspecified: Secondary | ICD-10-CM

## 2011-06-11 NOTE — Telephone Encounter (Signed)
Will order the bone density test.

## 2011-06-11 NOTE — Telephone Encounter (Signed)
Pt calls already has mamm[facility] appt scheduled [facility] [place] and pt would like bone density scheduled at the same time. Pt does not want to make 2 trips for each test. Pt also said she was unable to take Fosamax and Dr Milinda Antis had mentioned to pt about retesting bone density this year. Pt will wait to hear from pt care coordinator and can be reached at [phone].

## 2011-06-19 ENCOUNTER — Ambulatory Visit
Admission: RE | Admit: 2011-06-19 | Discharge: 2011-06-19 | Disposition: A | Discharge status: Home / Self Care | Source: Ambulatory Visit | Attending: Family Medicine | Admitting: Family Medicine

## 2011-06-19 DIAGNOSIS — M949 Disorder of cartilage, unspecified: Secondary | ICD-10-CM

## 2011-06-19 DIAGNOSIS — Z1231 Encounter for screening mammogram for malignant neoplasm of breast: Secondary | ICD-10-CM

## 2011-06-21 LAB — HM DEXA SCAN

## 2011-06-22 LAB — HM MAMMOGRAPHY: HM Mammogram: NORMAL

## 2011-06-23 ENCOUNTER — Encounter: Payer: Self-pay | Admitting: *Deleted

## 2011-06-23 ENCOUNTER — Telehealth: Payer: Self-pay | Admitting: *Deleted

## 2011-06-23 ENCOUNTER — Encounter: Payer: Self-pay | Admitting: Family Medicine

## 2011-06-23 NOTE — Telephone Encounter (Signed)
Prior auth given for [employer], approval letter placed on doctors desk for signature and scanning.

## 2011-09-07 ENCOUNTER — Telehealth: Payer: Self-pay | Admitting: Family Medicine

## 2011-09-07 DIAGNOSIS — Z0000 Encounter for general adult medical examination without abnormal findings: Secondary | ICD-10-CM | POA: Insufficient documentation

## 2011-09-07 DIAGNOSIS — M949 Disorder of cartilage, unspecified: Secondary | ICD-10-CM

## 2011-09-07 DIAGNOSIS — E7800 Pure hypercholesterolemia, unspecified: Secondary | ICD-10-CM

## 2011-09-07 NOTE — Telephone Encounter (Signed)
Message copied by Judy Pimple on Mon Sep 07, 2011  6:30 PM ------      Message from: Alvina Chou      Created: Tue Sep 01, 2011  3:34 PM      Regarding: Labs, Tuesday 11.27       Patient is scheduled for CPX labs, please order future labs, Thanks , Camelia Eng

## 2011-09-08 ENCOUNTER — Other Ambulatory Visit (INDEPENDENT_AMBULATORY_CARE_PROVIDER_SITE_OTHER)

## 2011-09-08 DIAGNOSIS — K219 Gastro-esophageal reflux disease without esophagitis: Secondary | ICD-10-CM

## 2011-09-08 DIAGNOSIS — E7800 Pure hypercholesterolemia, unspecified: Secondary | ICD-10-CM

## 2011-09-08 DIAGNOSIS — M899 Disorder of bone, unspecified: Secondary | ICD-10-CM

## 2011-09-08 DIAGNOSIS — Z0000 Encounter for general adult medical examination without abnormal findings: Secondary | ICD-10-CM

## 2011-09-08 LAB — COMPREHENSIVE METABOLIC PANEL
AST: 29 U/L (ref 0–37)
Albumin: 4.1 g/dL (ref 3.5–5.2)
Alkaline Phosphatase: 72 U/L (ref 39–117)
BUN: 11 mg/dL (ref 6–23)
Potassium: 4 mEq/L (ref 3.5–5.1)
Total Bilirubin: 0.7 mg/dL (ref 0.3–1.2)

## 2011-09-08 LAB — LIPID PANEL
Cholesterol: 172 mg/dL (ref 0–200)
HDL: 72.9 mg/dL (ref >39.00)
LDL Cholesterol: 85 mg/dL (ref 0–99)
Total CHOL/HDL Ratio: 2
Triglycerides: 72 mg/dL (ref 0.0–149.0)
VLDL: 14.4 mg/dL (ref 0.0–40.0)

## 2011-09-08 LAB — CBC WITH DIFFERENTIAL/PLATELET
Basophils Relative: 0.4 % (ref 0.0–3.0)
Eosinophils Absolute: 0.3 10*3/uL (ref 0.0–0.7)
HCT: 43.3 % (ref 36.0–46.0)
Lymphs Abs: 2.1 10*3/uL (ref 0.7–4.0)
MCHC: 33.9 g/dL (ref 30.0–36.0)
MCV: 96 fl (ref 78.0–100.0)
Monocytes Absolute: 0.7 10*3/uL (ref 0.1–1.0)
Neutrophils Relative %: 57.3 % (ref 43.0–77.0)
Platelets: 230 10*3/uL (ref 150.0–400.0)
RBC: 4.51 Mil/uL (ref 3.87–5.11)

## 2011-09-15 ENCOUNTER — Ambulatory Visit (INDEPENDENT_AMBULATORY_CARE_PROVIDER_SITE_OTHER): Admitting: Family Medicine

## 2011-09-15 ENCOUNTER — Encounter: Payer: Self-pay | Admitting: Family Medicine

## 2011-09-15 DIAGNOSIS — M899 Disorder of bone, unspecified: Secondary | ICD-10-CM

## 2011-09-15 DIAGNOSIS — Z01419 Encounter for gynecological examination (general) (routine) without abnormal findings: Secondary | ICD-10-CM

## 2011-09-15 DIAGNOSIS — E7800 Pure hypercholesterolemia, unspecified: Secondary | ICD-10-CM

## 2011-09-15 DIAGNOSIS — Z0000 Encounter for general adult medical examination without abnormal findings: Secondary | ICD-10-CM

## 2011-09-15 DIAGNOSIS — K219 Gastro-esophageal reflux disease without esophagitis: Secondary | ICD-10-CM

## 2011-09-15 DIAGNOSIS — F17200 Nicotine dependence, unspecified, uncomplicated: Secondary | ICD-10-CM

## 2011-09-15 DIAGNOSIS — M949 Disorder of cartilage, unspecified: Secondary | ICD-10-CM

## 2011-09-15 MED ORDER — OMEPRAZOLE 20 MG PO CPDR: 20.0000 mg | DELAYED_RELEASE_CAPSULE | Freq: Every day | ORAL | Status: DC

## 2011-09-15 NOTE — Assessment & Plan Note (Signed)
Disc in detail risks of smoking and possible outcomes including copd, vascular/ heart disease, cancer , respiratory and sinus infections (and stomach problems and also OP) Pt voices understanding  She states she is not ready to quit

## 2011-09-15 NOTE — Assessment & Plan Note (Signed)
Very good with diet control- commended Disc goals for lipids and reasons to control them Rev labs with pt Rev low sat fat diet in detail

## 2011-09-15 NOTE — Assessment & Plan Note (Signed)
dexa was essentially stable from 2 y before  Rev ca and vit D needs and exercise emph impt of smoking cessation

## 2011-09-15 NOTE — Progress Notes (Signed)
Subjective:    Patient ID: Cheryl Aguirre, female    DOB: 1946-01-25, 65 y.o.   MRN: [id number]  HPI Here for health maintenance exam and to review chronic medical problems   Is feeling ok overall  Allergies are in full blown form   gerd - is bothering her more - thinks for a few weeks -- ? Does not feel more stress than usual Used to be on prilosec  Has cut her coffee to 2 cups per day  Also drinks wine -- 1 glass per day     bp is 124/76- good  Wt down 1 lb with bmi of 21- stable  Is eating a balanced diet   Good lipids  Lab Results  Component Value Date   CHOL 172 09/08/2011   HDL 72.90 09/08/2011   LDLCALC 85 09/08/2011   TRIG 72.0 09/08/2011   CHOLHDL 2 09/08/2011   diet - is good   Smoking - not interested in quitting and understands risks  Last pap 11/09 Had total hyst for endometriosis Has some vaginal dryness  No abn paps since she was in her 33s -- all neg since  Mam 9/12 normal  Self exam - no lumps or problems   colonosc 5/11 had benign polyps  Father had colon cancer- 5 year follow up   Flu shot- got that  Zoster status  - has not had the dz or vaccine    Td 1011  dexa osteopenia this sept with FN score -1.8 in smoker  Supplements- is taking calcium and D  Exercise - keeps moving all the time  Is a smoker Non tol of fosamax  Patient Active Problem List  Diagnoses  . ONYCHOMYCOSIS  . PURE HYPERCHOLESTEROLEMIA  . ANXIETY  . TOBACCO ABUSE  . DEPRESSION  . GERD  . GASTROENTERITIS  . BACK PAIN  . OSTEOPENIA  . INSOMNIA  . ABDOMINAL PAIN  . Nonspecific (abnormal) findings on radiological and other examination of body structure  . COLONIC POLYPS, HYPERPLASTIC, HX OF  . FIBROCYSTIC BREAST DISEASE, HX OF  . NONSPCIFC ABN FINDING RAD & OTH EXAM LUNG FIELD  . ACUTE CYSTITIS  . DYSURIA  . Routine general medical examination at a health care facility  . Gynecologic exam normal   Past Medical History  Diagnosis Date  . Anxiety   .  Depression   . GERD (gastroesophageal reflux disease)   . Arthritis   . Tobacco abuse   . Insomnia   . Chronic headaches   . IBS (irritable bowel syndrome)   . Colon polyps    Past Surgical History  Procedure Date  . Abdominal hysterectomy     bleeding, fibroid, endometriosis (total)  . Laparoscopy 1984  . Ct of chest 10/09    bronchiectasis   History  Substance Use Topics  . Smoking status: Current Everyday Smoker  . Smokeless tobacco: Not on file  . Alcohol Use: No   Family History  Problem Relation Age of Onset  . Cancer Mother     pancreatic, liver  . Cancer Father     colon  . Thyroid disease Sister   . Colon polyps Sister   . Irritable bowel syndrome Sister   . Cancer Maternal Aunt     breast  . Cancer Paternal Grandmother     thyroid  . Miscarriages / [place] Cousin   . Cancer Cousin     breast  . Diabetes Cousin   . Thyroid disease Sister   . Heart  disease Other    Allergies  Allergen Reactions  . Alendronate Sodium     REACTION: muscle, joint pain, indigestion  . Doxycycline     REACTION: ? rash or n/v  . Penicillins   . Varenicline Tartrate     REACTION: nausea   Current Outpatient Prescriptions on File Prior to Visit  Medication Sig Dispense Refill  . escitalopram (LEXAPRO) 20 MG tablet Take 1 tablet (20 mg total) by mouth daily.  90 tablet  3  . montelukast (SINGULAIR) 10 MG tablet Take 1 tablet (10 mg total) by mouth daily.  90 tablet  3       Review of Systems Review of Systems  Constitutional: Negative for fever, appetite change, fatigue and unexpected weight change.  Eyes: Negative for pain and visual disturbance.  Respiratory: Negative for cough and shortness of breath.   Cardiovascular: Negative for cp or palpitations    Gastrointestinal: Negative for nausea, diarrhea and constipation. pos for stomach pain and indigestion  Genitourinary: Negative for urgency and frequency.  Skin: Negative for pallor or rash   Neurological:  Negative for weakness, light-headedness, numbness and headaches.  Hematological: Negative for adenopathy. Does not bruise/bleed easily.  Psychiatric/Behavioral: Negative for dysphoric mood. The patient is not nervous/anxious.  (is type "a" and stays stressed)         Objective:   Physical Exam  Constitutional: She appears well-developed and well-nourished. No distress.  HENT:  Head: Normocephalic and atraumatic.  Right Ear: External ear normal.  Left Ear: External ear normal.  Nose: Nose normal.  Mouth/Throat: Oropharynx is clear and moist.  Eyes: Conjunctivae and EOM are normal. Pupils are equal, round, and reactive to light. No scleral icterus.  Neck: Normal range of motion. Neck supple. No JVD present. Carotid bruit is not present. No thyromegaly present.  Cardiovascular: Normal rate, regular rhythm, normal heart sounds and intact distal pulses.  Exam reveals no gallop.   Pulmonary/Chest: Breath sounds normal. No respiratory distress. She has no wheezes.       Diffusely distant bs   Abdominal: Soft. Bowel sounds are normal. She exhibits no distension, no abdominal bruit and no mass.  Genitourinary: Rectum normal and vagina normal. No breast swelling, tenderness, discharge or bleeding. There is no rash, tenderness or lesion on the right labia. There is no rash, tenderness or lesion on the left labia. No tenderness or bleeding around the vagina. No vaginal discharge found.       No breast masses noted   Adnexa surgically absent Uterus and cervix surgically absent Nl appearing urethra Vaginal mucosa is slightly atrophic Mild cystocele noted   Musculoskeletal: Normal range of motion. She exhibits no edema and no tenderness.  Lymphadenopathy:    She has no cervical adenopathy.  Neurological: She is alert. She has normal reflexes. No cranial nerve deficit. She exhibits normal muscle tone. Coordination normal.  Skin: Skin is warm and dry. No rash noted. No erythema. No pallor.    Psychiatric: She has a normal mood and affect.          Assessment & Plan:

## 2011-09-15 NOTE — Assessment & Plan Note (Signed)
Reviewed health habits including diet and exercise and skin cancer prevention Also reviewed health mt list, fam hx and immunizations   Rev wellness labs in detail I recommended zoster vaccine

## 2011-09-15 NOTE — Patient Instructions (Signed)
Get back on the prilosec - I think it is important  Continue calcium and vitamin D for your bones -- and please consider smoking cessation  Will likely do next bone density test in 2 years  If you are interested in shingles vaccine in future - call your insurance company to see how coverage is and call us to schedule (it is a good idea) Smoking and Your Digestive System Cigarette smoking causes many life-threatening diseases. These include lung cancer, other cancers, emphysema, and heart disease. About 430,000 deaths each year are directly caused by cigarette smoking. Smoking results in disease-causing changes in all parts of the body. This includes the digestive system. This can cause serious effects, since the digestive system converts foods into nutrients the body needs to live. Smoking has been shown to have harmful effects on all parts of the digestive system. It adds to common disorders, such as heartburn and peptic ulcers. It also increases the risk of Crohn's disease, and possibly gallstones. Smoking seems to affect the liver by changing the way it handles drugs and alcohol and removes them. In fact, there seems to be enough evidence to stop smoking based solely on digestive distress. Some of the harmful effects of smoking are:  Heartburn (acid reflux).  Heartburn happens when acidic juices from the stomach splash into the esophagus, which has a more sensitive and less acid-resistant lining than the stomach. Normally, a muscular valve at the lower end of the esophagus keeps out the acid solution in the stomach. Smoking decreases the strength of the esophageal valve and its ability to keep out acidic stomach contents. This allows stomach acid reflux, or flow backward into the esophagus.     Smoking also seems to promote the movement of bile salts from the intestine to the stomach. This makes stomach acids more harmful.     A peptic ulcer is an open sore in the lining of the stomach or duodenum  (first part of the small intestine). The exact cause of ulcers is not known. A link between smoking cigarettes and ulcers, especially duodenal ulcers, does exist. Ulcers are more likely to occur, less likely to heal, and more likely to cause death in smokers than in nonsmokers.     Some research suggests that smoking might increase a person's risk of infection with the bacterium Helicobacter pylori (H. pylori). Most peptic ulcers are caused by this bacterium.     Stomach acid is also important in causing ulcers. Normally, most of this acid is buffered (neutralized) by the food we eat. Most of the unbuffered acid that enters the duodenum is quickly neutralized by sodium bicarbonate. This is a naturally occurring alkali, produced by the pancreas. Some studies show that smoking reduces the bicarbonate produced by the pancreas. This interferes with the neutralization of acid in the duodenum. Other studies suggest that chronic cigarette smoking may increase the amount of acid produced by the stomach.     Whatever causes the link between smoking and ulcers, two points have been repeatedly shown. People who smoke are more likely to develop an ulcer, especially a duodenal ulcer. Ulcers in smokers are less likely to heal quickly in response to otherwise effective treatment. This research strongly suggests that a person with an ulcer should stop smoking.     The liver is an important organ with many tasks. One task of the liver is to prepare drugs, alcohol, and other toxins for elimination (removal) from the body. There is evidence that smoking alters the  ability of the liver to effectively handle such substances. In some cases, this may influence the dose of medicine needed to treat an illness. Some research suggests that smoking can aggravate and speed up the course of liver disease caused by excessive alcohol intake.     Studies have shown that smokers have weaker or less frequent stomach contractions while  smoking, which can cause less efficient digestion.     Crohn's disease causes inflammation deep in the lining of the intestine. The disease causes pain and diarrhea. It usually affects the small intestine, but it can occur anywhere in the digestive tract. Research shows that current and former smokers have a higher risk of developing Crohn's disease than nonsmokers. Among people with the disease, smoking is linked with a higher rate of relapse, repeat surgery, and immunosuppressive treatment. In all areas, the risk for women who are current or former smokers is slightly higher than for men. Why smoking increases the risk of Crohn's disease is unknown.     Several studies suggest that smoking may increase the risk of developing gallstones. The risk may be higher for women. Research results on this topic are not consistent. More studies are needed.     Oral (lip and mouth) cancer and cancer of the pharynx (throat) and the esophagus are caused by smoking. Smoking may be associated with pancreatic cancer.     Some of the effects of smoking on the digestive system seem to be short-lived. For example, the effect of smoking on bicarbonate production by the pancreas does not appear to last. Half an hour after smoking, the production of bicarbonate returns to normal. The effects of smoking on how the liver handles drugs also disappear when a person stops smoking. However, people who no longer smoke still remain at risk for Crohn's disease.  Document Released: 09/10/2004 Document Revised: 06/10/2011 Document Reviewed: 07/15/2009 [facility] Patient Information 2012 [place], [place].

## 2011-09-15 NOTE — Assessment & Plan Note (Signed)
Worse lately  Needs to go back on prilosec- disc pros and cons and risks Adv to quit smoking  Update if not imp

## 2011-09-15 NOTE — Assessment & Plan Note (Signed)
Exam done without pap due to hysterectomy Nl exam/ no c/o

## 2012-02-09 ENCOUNTER — Other Ambulatory Visit: Payer: Self-pay

## 2012-02-09 NOTE — Telephone Encounter (Signed)
Pt request refill for generic Lexapro. [facility] and [facility]ills [facility] our [facility] for refills. If pt has a problem with refill she will call our office back.

## 2012-02-10 ENCOUNTER — Other Ambulatory Visit: Payer: Self-pay | Admitting: *Deleted

## 2012-02-10 MED ORDER — ESCITALOPRAM OXALATE 20 MG PO TABS: 20.0000 mg | ORAL_TABLET | Freq: Every day | ORAL | Status: DC

## 2012-02-10 NOTE — Telephone Encounter (Signed)
Received faxed refill request from pharmacy for Lexapro. Is it okay to refill medication? Form is in your in box.

## 2012-02-10 NOTE — Telephone Encounter (Signed)
Yes, I sent it electronically  Form in IN box

## 2012-05-02 ENCOUNTER — Telehealth: Payer: Self-pay | Admitting: *Deleted

## 2012-05-02 MED ORDER — MONTELUKAST SODIUM 10 MG PO TABS: 10.0000 mg | ORAL_TABLET | Freq: Every day | ORAL | Status: DC

## 2012-05-02 NOTE — Telephone Encounter (Signed)
Patient request refill on singular and it is not on her medication list okay to refill?

## 2012-05-02 NOTE — Telephone Encounter (Signed)
Left message for patient to call and let me know which pharmacy she wants he Rx called in to.

## 2012-05-02 NOTE — Telephone Encounter (Signed)
Go ahead and fill. 

## 2012-05-03 MED ORDER — MONTELUKAST SODIUM 10 MG PO TABS: 10.0000 mg | ORAL_TABLET | Freq: Every day | ORAL | Status: DC

## 2012-05-03 NOTE — Telephone Encounter (Signed)
Refill sent to [facility]

## 2012-05-06 ENCOUNTER — Telehealth: Payer: Self-pay

## 2012-06-28 ENCOUNTER — Other Ambulatory Visit: Payer: Self-pay | Admitting: Family Medicine

## 2012-06-28 DIAGNOSIS — Z1231 Encounter for screening mammogram for malignant neoplasm of breast: Secondary | ICD-10-CM

## 2012-07-05 ENCOUNTER — Ambulatory Visit

## 2012-07-06 ENCOUNTER — Ambulatory Visit
Admission: RE | Admit: 2012-07-06 | Discharge: 2012-07-06 | Disposition: A | Discharge status: Home / Self Care | Source: Ambulatory Visit | Attending: Family Medicine | Admitting: Family Medicine

## 2012-07-06 DIAGNOSIS — Z1231 Encounter for screening mammogram for malignant neoplasm of breast: Secondary | ICD-10-CM

## 2012-07-07 ENCOUNTER — Encounter: Payer: Self-pay | Admitting: *Deleted

## 2012-07-11 ENCOUNTER — Ambulatory Visit (INDEPENDENT_AMBULATORY_CARE_PROVIDER_SITE_OTHER): Admitting: Family Medicine

## 2012-07-11 ENCOUNTER — Telehealth: Payer: Self-pay | Admitting: Family Medicine

## 2012-07-11 ENCOUNTER — Encounter: Payer: Self-pay | Admitting: Family Medicine

## 2012-07-11 VITALS — BP 132/64 | HR 78 | Temp 97.9°F | Ht 63.5 in | Wt 132.8 lb

## 2012-07-11 DIAGNOSIS — R300 Dysuria: Secondary | ICD-10-CM

## 2012-07-11 DIAGNOSIS — N390 Urinary tract infection, site not specified: Secondary | ICD-10-CM

## 2012-07-11 DIAGNOSIS — F17200 Nicotine dependence, unspecified, uncomplicated: Secondary | ICD-10-CM

## 2012-07-11 DIAGNOSIS — B9689 Other specified bacterial agents as the cause of diseases classified elsewhere: Secondary | ICD-10-CM | POA: Insufficient documentation

## 2012-07-11 DIAGNOSIS — J019 Acute sinusitis, unspecified: Secondary | ICD-10-CM

## 2012-07-11 LAB — POCT UA - MICROSCOPIC ONLY: Casts, Ur, LPF, POC: 0

## 2012-07-11 LAB — POCT URINALYSIS DIPSTICK
Bilirubin, UA: NEGATIVE
Nitrite, UA: NEGATIVE
Spec Grav, UA: <=1.005
pH, UA: 7

## 2012-07-11 MED ORDER — LEVOFLOXACIN 500 MG PO TABS: 500.0000 mg | ORAL_TABLET | Freq: Every day | ORAL | Status: DC

## 2012-07-11 NOTE — Telephone Encounter (Signed)
[facility] Triage Call Report Triage Record Num: [id number] Operator: Di Kindle Patient Name: Cheryl Aguirre Call Date & Time: 07/10/2012 5:09:15PM Patient Phone: [phone] PCP: Audrie Gallus. Tower Patient Gender: Female PCP Fax : Patient DOB: March 28, 1946 Practice Name: [facility] Reason for Call: Caller: Cheryl Aguirre/Patient; PCP: Roxy Manns [facility]); CB#: [phone]; Caller reports ~ 1 week ago of sinus headache, calling for appointment, intermittent low grade fever. Guideline: URI, Disposition: See within 24 hours due to headache, patient agrees to appointment 07/11/12 0900 with Pincus Sanes, MD. Care advice given. Protocol(s) Used: Upper Respiratory Infection (URI) Recommended Outcome per Protocol: See Provider within 24 hours Reason for Outcome: Mild to moderate headache for more than 24 hours unrelieved with nonprescription medications Care Advice: Call provider if headache worsens, develops temperature greater than 101.24F (38.1C) or any temperature elevation in a geriatric or immunocompromised patient (such as diabetes, HIV/AIDS, chemotherapy, organ transplant, or chronic steroid use). ~ Most adults need to drink 6-10 eight-ounce glasses (1.2-2.0 liters) of fluids per day unless previously told to limit fluid intake for other medical reasons. Limit fluids that contain caffeine, sugar or alcohol. Urine will be a very light yellow color when you drink enough fluids. ~ 07/10/2012 5:21:26PM Page 1 of 1 [id number]

## 2012-07-11 NOTE — Assessment & Plan Note (Signed)
Will tx with levaquin Disc symptomatic care - see instructions on AVS  Update if not starting to improve in a week or if worsening   Did enc to quit smoking

## 2012-07-11 NOTE — Assessment & Plan Note (Signed)
Will tx with levaquin (pt also has sinusitis) Disc water intake and ways to prevent utis cx urine pending Will update if no imp

## 2012-07-11 NOTE — Assessment & Plan Note (Signed)
Disc in detail risks of smoking and possible outcomes including copd, vascular/ heart disease, cancer , respiratory and sinus infections  Pt voices understanding Pt not ready to quit at this time 

## 2012-07-11 NOTE — Patient Instructions (Addendum)
Drink lots of water Keep thinking about quitting smoking Nasal saline spray for sinus congestion mucinex DM for congestion and cough  We will culture urine and let you know result  Update if not starting to improve in a week or if worsening

## 2012-07-11 NOTE — Progress Notes (Signed)
Subjective:    Patient ID: Cheryl Aguirre, female    DOB: 03/19/1946, 66 y.o.   MRN: [id number]  HPI Here for uri and uti symptoms   [place]- cough on and off all summer  Now head is really congested / headache/ ears are full  Cough - is prod of yellow sputum  Some facial pain in the R side   Low grade fever  occ wheeze after cough  Taking zyrtec and otc decongestant (phenylephrine?)  Smoking status- still not ready to quit  Is thinking about it  Every day   uti symptoms:  More utis lately - no reason why Is careful about wiping Burning and Pain on urination Incontinence- more than usual  Some blood in urine one time, mild Low back is sore  Frequency and urgency   Patient Active Problem List  Diagnosis  . ONYCHOMYCOSIS  . PURE HYPERCHOLESTEROLEMIA  . ANXIETY  . TOBACCO ABUSE  . DEPRESSION  . GERD  . BACK PAIN  . OSTEOPENIA  . INSOMNIA  . ABDOMINAL PAIN  . Nonspecific (abnormal) findings on radiological and other examination of body structure  . COLONIC POLYPS, HYPERPLASTIC, HX OF  . FIBROCYSTIC BREAST DISEASE, HX OF  . NONSPCIFC ABN FINDING RAD & OTH EXAM LUNG FIELD  . ACUTE CYSTITIS  . DYSURIA  . Routine general medical examination at a health care facility  . Gynecologic exam normal  . Sinusitis acute  . UTI (lower urinary tract infection)   Past Medical History  Diagnosis Date  . Anxiety   . Depression   . GERD (gastroesophageal reflux disease)   . Arthritis   . Tobacco abuse   . Insomnia   . Chronic headaches   . IBS (irritable bowel syndrome)   . Colon polyps    Past Surgical History  Procedure Date  . Abdominal hysterectomy     bleeding, fibroid, endometriosis (total)  . Laparoscopy 1984  . Ct of chest 10/09    bronchiectasis   History  Substance Use Topics  . Smoking status: Current Every Day Smoker    Types: Cigarettes  . Smokeless tobacco: Not on file  . Alcohol Use: Yes     occasional   Family History  Problem Relation Age of  Onset  . Cancer Mother     pancreatic, liver  . Cancer Father     colon  . Thyroid disease Sister   . Colon polyps Sister   . Irritable bowel syndrome Sister   . Cancer Maternal Aunt     breast  . Cancer Paternal Grandmother     thyroid  . Miscarriages / [place] Cousin   . Cancer Cousin     breast  . Diabetes Cousin   . Thyroid disease Sister   . Heart disease Other    Allergies  Allergen Reactions  . Alendronate Sodium     REACTION: muscle, joint pain, indigestion  . Doxycycline     REACTION: ? rash or n/v  . Penicillins   . Varenicline Tartrate     REACTION: nausea   Current Outpatient Prescriptions on File Prior to Visit  Medication Sig Dispense Refill  . calcium carbonate (TUMS - DOSED IN MG ELEMENTAL CALCIUM) 500 MG chewable tablet Chew 1 tablet by mouth daily as needed.        . Calcium Citrate-Vitamin D (CITRACAL/VITAMIN D) 250-200 MG-UNIT TABS Take 4 tablets by mouth daily.       . cetirizine (ZYRTEC) 10 MG tablet Take 10 mg by  mouth daily.        Marland Kitchen escitalopram (LEXAPRO) 20 MG tablet Take 1 tablet (20 mg total) by mouth daily.  90 tablet  3  . fish oil-omega-3 fatty acids 1000 MG capsule Take 2 g by mouth daily.       Marland Kitchen glucosamine-chondroitin 500-400 MG tablet Take 1 tablet by mouth 3 or 4 times a week.       . Lactobacillus (ACIDOPHILUS) 100 MG CAPS Take 1 capsule by mouth daily.        . Melatonin 1 MG TABS As directed at bedtime.       . montelukast (SINGULAIR) 10 MG tablet Take 1 tablet (10 mg total) by mouth at bedtime.  90 tablet  3  . Multiple Vitamin (MULTIVITAMIN) tablet Take 1 tablet by mouth daily.        Marland Kitchen omeprazole (PRILOSEC) 20 MG capsule Take 1 capsule (20 mg total) by mouth daily.  90 capsule  3  . Phenylephrine-Bromphen-DM (PHENYLEPHRINE COMPLEX PO) Take 1 tablet up to every 4 hours as needed       . Prenatal Vit-Fe Sulfate-FA (PRENATAL VITAMIN PO) Take 1 tablet by mouth daily.        . vitamin E 1000 UNIT capsule Take 1,000 Units by mouth  daily.        Marland Kitchen co-enzyme Q-10 30 MG capsule Take 30 mg by mouth daily.        Marland Kitchen dicyclomine (BENTYL) 10 MG capsule Take 10 mg by mouth 4 (four) times daily -  before meals and at bedtime. As needed.       . Ginkgo Biloba 40 MG TABS Take 1 tablet by mouth daily.        . montelukast (SINGULAIR) 10 MG tablet Take 1 tablet (10 mg total) by mouth daily.  90 tablet  3      Review of Systems    Review of Systems  Constitutional: Negative for fever, appetite change,  and unexpected weight change. pos for fatigue  Eyes: Negative for pain and visual disturbance.  ENT pos for congestion and sinus pain and post nasal drip  Respiratory: Negative for sob and wheeze  Cardiovascular: Negative for cp or palpitations    Gastrointestinal: Negative for nausea, diarrhea and constipation.  Genitourinary: pos for urgency and frequency. neg for flank pain  Skin: Negative for pallor or rash   Neurological: Negative for weakness, light-headedness, numbness and headaches.  Hematological: Negative for adenopathy. Does not bruise/bleed easily.  Psychiatric/Behavioral: Negative for dysphoric mood. The patient is not nervous/anxious.      Objective:   Physical Exam  Constitutional: She appears well-developed and well-nourished. No distress.  HENT:  Head: Normocephalic and atraumatic.  Right Ear: External ear normal.  Left Ear: External ear normal.  Mouth/Throat: Oropharynx is clear and moist. No oropharyngeal exudate.       Nares are injected and congested  Bilateral maxillary sinus tenderness  Eyes: Conjunctivae normal and EOM are normal. Pupils are equal, round, and reactive to light. Right eye exhibits no discharge. Left eye exhibits no discharge.  Neck: Normal range of motion. Neck supple. No thyromegaly present.  Cardiovascular: Normal rate, regular rhythm, normal heart sounds and intact distal pulses.  Exam reveals no gallop.   Pulmonary/Chest: Effort normal and breath sounds normal. No respiratory  distress. She has no wheezes. She has no rales.       Diffusely distant bs Few scattered rhonchi Dry sounding cough  Abdominal: Soft. Bowel sounds are normal. She  exhibits no distension and no mass. There is tenderness. There is no rebound and no guarding.       Mild suprapubic tenderness without fullness  Musculoskeletal:       No cva tenderness   Lymphadenopathy:    She has no cervical adenopathy.  Neurological: She is alert.  Skin: Skin is warm and dry. No rash noted. No erythema. No pallor.  Psychiatric: She has a normal mood and affect.          Assessment & Plan:

## 2012-07-11 NOTE — Telephone Encounter (Signed)
I will see her for her appt 

## 2012-07-20 ENCOUNTER — Encounter: Payer: Self-pay | Admitting: Family Medicine

## 2012-07-20 ENCOUNTER — Ambulatory Visit (INDEPENDENT_AMBULATORY_CARE_PROVIDER_SITE_OTHER): Payer: Medicare Other | Admitting: Family Medicine

## 2012-07-20 ENCOUNTER — Telehealth: Payer: Self-pay

## 2012-07-20 VITALS — BP 126/78 | HR 78 | Temp 97.9°F | Ht 63.5 in | Wt 130.8 lb

## 2012-07-20 DIAGNOSIS — R509 Fever, unspecified: Secondary | ICD-10-CM | POA: Diagnosis not present

## 2012-07-20 DIAGNOSIS — J019 Acute sinusitis, unspecified: Secondary | ICD-10-CM

## 2012-07-20 DIAGNOSIS — N390 Urinary tract infection, site not specified: Secondary | ICD-10-CM | POA: Diagnosis not present

## 2012-07-20 LAB — POCT URINALYSIS DIPSTICK
Bilirubin, UA: NEGATIVE
Blood, UA: NEGATIVE
Glucose, UA: NEGATIVE
Ketones, UA: NEGATIVE
Spec Grav, UA: <=1.005

## 2012-07-20 MED ORDER — ERYTHROMYCIN BASE 500 MG PO TABS: 500.0000 mg | ORAL_TABLET | Freq: Two times a day (BID) | ORAL | Status: DC

## 2012-07-20 NOTE — Patient Instructions (Addendum)
Take the erythromycin for sinus infection  Continue current medicines  If not improved after the course- we will want to get a cat scan of your sinuses Think about quitting smoking  I sent px to the pharmacy  If your get worse also let me know

## 2012-07-20 NOTE — Telephone Encounter (Signed)
Pt seen 07/11/12;finished antibiotic 07/18/12 still productive cough with yellow phlegm,fever, h/a head congestion, frequency of urine with dark color and lower abdominal pain and bloating. To see Dr Milinda Antis today at 4:15.

## 2012-07-20 NOTE — Progress Notes (Signed)
Subjective:    Patient ID: Cheryl Aguirre, female    DOB: [birth year], 66 y.o.   MRN: [id number]  HPI Here for ongoing symptoms   Urine was dark this am  Still some bladder pressure Does not hurt to urinate   Still having some sinus symptoms Took levaquin for sinus infection  Still has congestion- feels burning in the back of throat and nose  Headache is still frontal - moreso on the R and a little on the left - throbs at times  Never had migraines  Even the skin is tender to the touch   advil puts a dent in it  Low grade fever at times - a little high for her   Doing nasal saline irrigation  occ coughs up some phlegm -- is yellow to white  On zyrtec and decongestant  mucinex as needed   Nasal discharge is mostly clear , but it feels like she still has a sinus infection She is a smoker with no plans to quit  Patient Active Problem List  Diagnosis  . ONYCHOMYCOSIS  . PURE HYPERCHOLESTEROLEMIA  . ANXIETY  . TOBACCO ABUSE  . DEPRESSION  . GERD  . BACK PAIN  . OSTEOPENIA  . INSOMNIA  . ABDOMINAL PAIN  . Nonspecific (abnormal) findings on radiological and other examination of body structure  . COLONIC POLYPS, HYPERPLASTIC, HX OF  . FIBROCYSTIC BREAST DISEASE, HX OF  . NONSPCIFC ABN FINDING RAD & OTH EXAM LUNG FIELD  . ACUTE CYSTITIS  . DYSURIA  . Routine general medical examination at a health care facility  . Gynecologic exam normal  . Acute bacterial sinusitis  . UTI (lower urinary tract infection)   Past Medical History  Diagnosis Date  . Anxiety   . Depression   . GERD (gastroesophageal reflux disease)   . Arthritis   . Tobacco abuse   . Insomnia   . Chronic headaches   . IBS (irritable bowel syndrome)   . Colon polyps    Past Surgical History  Procedure Date  . Abdominal hysterectomy     bleeding, fibroid, endometriosis (total)  . Laparoscopy 1984  . Ct of chest 10/09    bronchiectasis   History  Substance Use Topics  . Smoking status:  Current Every Day Smoker    Types: Cigarettes  . Smokeless tobacco: Not on file  . Alcohol Use: Yes     occasional   Family History  Problem Relation Age of Onset  . Cancer Mother     pancreatic, liver  . Cancer Father     colon  . Thyroid disease Sister   . Colon polyps Sister   . Irritable bowel syndrome Sister   . Cancer Maternal Aunt     breast  . Cancer Paternal Grandmother     thyroid  . Miscarriages / [place] Cousin   . Cancer Cousin     breast  . Diabetes Cousin   . Thyroid disease Sister   . Heart disease Other    Allergies  Allergen Reactions  . Alendronate Sodium     REACTION: muscle, joint pain, indigestion  . Doxycycline     REACTION: ? rash or n/v  . Penicillins   . Varenicline Tartrate     REACTION: nausea   Current Outpatient Prescriptions on File Prior to Visit  Medication Sig Dispense Refill  . calcium carbonate (TUMS - DOSED IN MG ELEMENTAL CALCIUM) 500 MG chewable tablet Chew 1 tablet by mouth daily as needed.        Marland Kitchen  Calcium Citrate-Vitamin D (CITRACAL/VITAMIN D) 250-200 MG-UNIT TABS Take 4 tablets by mouth daily.       . cetirizine (ZYRTEC) 10 MG tablet Take 10 mg by mouth daily.        Marland Kitchen co-enzyme Q-10 30 MG capsule Take 30 mg by mouth daily.        Marland Kitchen dicyclomine (BENTYL) 10 MG capsule Take 10 mg by mouth 4 (four) times daily -  before meals and at bedtime. As needed.       Marland Kitchen escitalopram (LEXAPRO) 20 MG tablet Take 1 tablet (20 mg total) by mouth daily.  90 tablet  3  . fish oil-omega-3 fatty acids 1000 MG capsule Take 2 g by mouth daily.       . Ginkgo Biloba 40 MG TABS Take 1 tablet by mouth daily.        Marland Kitchen glucosamine-chondroitin 500-400 MG tablet Take 1 tablet by mouth 3 or 4 times a week.       . Lactobacillus (ACIDOPHILUS) 100 MG CAPS Take 1 capsule by mouth daily.        . Melatonin 1 MG TABS As directed at bedtime.       . montelukast (SINGULAIR) 10 MG tablet Take 1 tablet (10 mg total) by mouth at bedtime.  90 tablet  3  .  Multiple Vitamin (MULTIVITAMIN) tablet Take 1 tablet by mouth daily.        Marland Kitchen omeprazole (PRILOSEC) 20 MG capsule Take 1 capsule (20 mg total) by mouth daily.  90 capsule  3  . Phenylephrine-Bromphen-DM (PHENYLEPHRINE COMPLEX PO) Take 1 tablet up to every 4 hours as needed       . Prenatal Vit-Fe Sulfate-FA (PRENATAL VITAMIN PO) Take 1 tablet by mouth daily.        . vitamin E 1000 UNIT capsule Take 1,000 Units by mouth daily.        . montelukast (SINGULAIR) 10 MG tablet Take 1 tablet (10 mg total) by mouth daily.  90 tablet  3        Review of Systems Review of Systems  Constitutional: Negative for , appetite change, and unexpected weight change.  Eyes: Negative for pain and visual disturbance.  ENT pos for congestion/ sinus pain/ post nasal drip / st Respiratory: Negative for sob or wheeze   Cardiovascular: Negative for cp or palpitations    Gastrointestinal: Negative for nausea, diarrhea and constipation.  Genitourinary: Negative for urgency and frequency.  Skin: Negative for pallor or rash   Neurological: Negative for weakness, light-headedness, numbness and pos for headache  Hematological: Negative for adenopathy. Does not bruise/bleed easily.  Psychiatric/Behavioral: Negative for dysphoric mood. The patient is not nervous/anxious.         Objective:   Physical Exam  Constitutional: She appears well-developed and well-nourished. No distress.  HENT:  Head: Normocephalic and atraumatic.  Right Ear: External ear normal.  Left Ear: External ear normal.  Mouth/Throat: Oropharynx is clear and moist. No oropharyngeal exudate.       Nares are injected and congested  -worse on L  L ethmoid and maxillary sinus tenderness Post nasal drip  Eyes: Conjunctivae normal and EOM are normal. Pupils are equal, round, and reactive to light. Right eye exhibits no discharge. Left eye exhibits no discharge.  Neck: Normal range of motion. Neck supple. No thyromegaly present.  Cardiovascular:  Normal rate, regular rhythm and normal heart sounds.   Pulmonary/Chest: Effort normal and breath sounds normal. No respiratory distress. She has no wheezes.  She has no rales.       Diffusely distant bs   Abdominal: Soft. Bowel sounds are normal. She exhibits no distension. There is no tenderness.       No suprapubic tenderness or fullness    Musculoskeletal:       No cva tenderness   Lymphadenopathy:    She has no cervical adenopathy.  Neurological: She is alert. She has normal reflexes. No cranial nerve deficit.  Skin: Skin is warm and dry. No rash noted.  Psychiatric: She has a normal mood and affect.          Assessment & Plan:

## 2012-07-21 NOTE — Assessment & Plan Note (Signed)
Residual bladder discomfort but clear ua today- appears to be resolved Counseled pt to drink more water and avoid other beverages Update if not starting to improve in a week or if worsening

## 2012-07-21 NOTE — Assessment & Plan Note (Signed)
In a smoker - with little improvement after course of levaquin Disc symptomatic care - see instructions on AVS  Will tx with 14 d of erythromycin - pt states this has worked in the past (she is pcn all) If not resolved at that time will need CT of sinuses

## 2012-07-22 ENCOUNTER — Ambulatory Visit: Admitting: Family Medicine

## 2012-08-25 DIAGNOSIS — H43819 Vitreous degeneration, unspecified eye: Secondary | ICD-10-CM | POA: Diagnosis not present

## 2012-09-03 ENCOUNTER — Other Ambulatory Visit: Payer: Self-pay | Admitting: Family Medicine

## 2012-10-25 ENCOUNTER — Telehealth: Payer: Self-pay | Admitting: Family Medicine

## 2012-10-25 DIAGNOSIS — K219 Gastro-esophageal reflux disease without esophagitis: Secondary | ICD-10-CM

## 2012-10-25 DIAGNOSIS — E7800 Pure hypercholesterolemia, unspecified: Secondary | ICD-10-CM

## 2012-10-25 DIAGNOSIS — M899 Disorder of bone, unspecified: Secondary | ICD-10-CM

## 2012-10-25 NOTE — Telephone Encounter (Signed)
Message copied by Judy Pimple on Tue Oct 25, 2012  8:53 PM ------      Message from: Alvina Chou      Created: Fri Oct 21, 2012 12:36 PM      Regarding: lab orders for Wednesday, 1.15.14       Patient is scheduled for CPX labs, please order future labs, Thanks , Camelia Eng

## 2012-10-26 ENCOUNTER — Other Ambulatory Visit (INDEPENDENT_AMBULATORY_CARE_PROVIDER_SITE_OTHER): Payer: Medicare Other

## 2012-10-26 DIAGNOSIS — M899 Disorder of bone, unspecified: Secondary | ICD-10-CM | POA: Diagnosis not present

## 2012-10-26 DIAGNOSIS — K219 Gastro-esophageal reflux disease without esophagitis: Secondary | ICD-10-CM | POA: Diagnosis not present

## 2012-10-26 DIAGNOSIS — E7800 Pure hypercholesterolemia, unspecified: Secondary | ICD-10-CM

## 2012-10-26 DIAGNOSIS — M949 Disorder of cartilage, unspecified: Secondary | ICD-10-CM | POA: Diagnosis not present

## 2012-10-26 LAB — COMPREHENSIVE METABOLIC PANEL
AST: 25 U/L (ref 0–37)
Albumin: 4.5 g/dL (ref 3.5–5.2)
Alkaline Phosphatase: 81 U/L (ref 39–117)
BUN: 14 mg/dL (ref 6–23)
Calcium: 9.6 mg/dL (ref 8.4–10.5)
Chloride: 101 mEq/L (ref 96–112)
Creatinine, Ser: 0.7 mg/dL (ref 0.4–1.2)
Glucose, Bld: 97 mg/dL (ref 70–99)

## 2012-10-26 LAB — CBC WITH DIFFERENTIAL/PLATELET
Basophils Relative: 0.7 % (ref 0.0–3.0)
Eosinophils Absolute: 0.3 10*3/uL (ref 0.0–0.7)
Eosinophils Relative: 4 % (ref 0.0–5.0)
Hemoglobin: 15.7 g/dL — ABNORMAL HIGH (ref 12.0–15.0)
MCHC: 33.9 g/dL (ref 30.0–36.0)
MCV: 94.7 fl (ref 78.0–100.0)
Monocytes Absolute: 0.8 10*3/uL (ref 0.1–1.0)
Neutro Abs: 3 10*3/uL (ref 1.4–7.7)
RBC: 4.88 Mil/uL (ref 3.87–5.11)
WBC: 6.5 10*3/uL (ref 4.5–10.5)

## 2012-10-26 LAB — LIPID PANEL
Cholesterol: 211 mg/dL — ABNORMAL HIGH (ref 0–200)
HDL: 66.9 mg/dL (ref >39.00)
Triglycerides: 106 mg/dL (ref 0.0–149.0)

## 2012-10-27 LAB — VITAMIN D 25 HYDROXY (VIT D DEFICIENCY, FRACTURES): Vit D, 25-Hydroxy: 49 ng/mL (ref 30–89)

## 2012-11-01 ENCOUNTER — Ambulatory Visit (INDEPENDENT_AMBULATORY_CARE_PROVIDER_SITE_OTHER): Payer: Medicare Other | Admitting: Family Medicine

## 2012-11-01 ENCOUNTER — Encounter: Payer: Self-pay | Admitting: Family Medicine

## 2012-11-01 VITALS — BP 132/68 | HR 74 | Temp 97.9°F | Ht 63.5 in | Wt 130.8 lb

## 2012-11-01 DIAGNOSIS — M949 Disorder of cartilage, unspecified: Secondary | ICD-10-CM | POA: Diagnosis not present

## 2012-11-01 DIAGNOSIS — Z23 Encounter for immunization: Secondary | ICD-10-CM | POA: Diagnosis not present

## 2012-11-01 DIAGNOSIS — M899 Disorder of bone, unspecified: Secondary | ICD-10-CM | POA: Diagnosis not present

## 2012-11-01 DIAGNOSIS — E7800 Pure hypercholesterolemia, unspecified: Secondary | ICD-10-CM | POA: Diagnosis not present

## 2012-11-01 DIAGNOSIS — F17200 Nicotine dependence, unspecified, uncomplicated: Secondary | ICD-10-CM

## 2012-11-01 DIAGNOSIS — R519 Headache, unspecified: Secondary | ICD-10-CM | POA: Insufficient documentation

## 2012-11-01 DIAGNOSIS — R51 Headache: Secondary | ICD-10-CM

## 2012-11-01 NOTE — Assessment & Plan Note (Signed)
utd dexa Disc ca and D No falls or fx  Is a smoker- will continue to follow

## 2012-11-01 NOTE — Assessment & Plan Note (Signed)
Lipids up a bit Disc goals for lipids and reasons to control them- goal LDL under 100 in light of smoking  Rev labs with pt Rev low sat fat diet in detail

## 2012-11-01 NOTE — Assessment & Plan Note (Signed)
Disc in detail risks of smoking and possible outcomes including copd, vascular/ heart disease, cancer , respiratory and sinus infections  Pt voices understanding Pt is not interested in quitting  

## 2012-11-01 NOTE — Patient Instructions (Addendum)
If you are interested in a shingles/zoster vaccine - call your insurance to check on coverage,( you should not get it within 1 month of other vaccines) , then call us for a prescription  for it to take to a pharmacy that gives the shot , or make a nurse visit to get it here depending on your coverage Pneumonia vaccine today Avoid red meat/ fried foods/ egg yolks/ fatty breakfast meats/ butter, cheese and high fat dairy/ and shellfish     We will refer you for CT of the head

## 2012-11-01 NOTE — Assessment & Plan Note (Signed)
Pt has new daily ha - with some vision change -had visit with opthy  Is a smoker  Does have chronic sinus problems Check CT no contrast  If normal - would consider neurol eval

## 2012-11-01 NOTE — Progress Notes (Signed)
Subjective:    Patient ID: Cheryl Aguirre, female    DOB: [birth year], 67 y.o.   MRN: [id number]  HPI Here for check up of chronic medical conditions and to review health mt list   Is doing well except for headaches  Went to the eye doctor - and told she has ocular migraines  Wonders if she needs a CT for head and sinuses  She even has tenderness to the touch on her scalp in areas  No head injuries   Wt is stable with bmi of 22  Zoster status- has not had the vaccine   Flu vaccine- has had vaccine in the fall  Pneumovax 06  mammo 9/13 Self exam-no lumps or changes   hyst in past  Gyn exam in 2012- wants to do that every 3 years  No problems or complaints    colonosc 5/11 with 5 year recall due to hx of polyps  Osteopenia  dexa 9/12 D level is 49  Tobacco status- smokes about a pack per day- no plans or intention to quit yet  Lab Results  Component Value Date   WBC 6.5 10/26/2012   HGB 15.7* 10/26/2012   HCT 46.2* 10/26/2012   MCV 94.7 10/26/2012   PLT 232.0 10/26/2012  was taking PNV with iron - will stop that and take mvi    Hyperlipidemia Lab Results  Component Value Date   CHOL 211* 10/26/2012   CHOL 172 09/08/2011   CHOL 185 07/21/2010   Lab Results  Component Value Date   HDL 66.90 10/26/2012   HDL 40.98 09/08/2011   HDL 61.80 07/21/2010   Lab Results  Component Value Date   LDLCALC 85 09/08/2011   LDLCALC 112* 07/21/2010   LDLCALC 98 07/17/2008   Lab Results  Component Value Date   TRIG 106.0 10/26/2012   TRIG 72.0 09/08/2011   TRIG 58.0 07/21/2010   Lab Results  Component Value Date   CHOLHDL 3 10/26/2012   CHOLHDL 2 09/08/2011   CHOLHDL 3 07/21/2010   Lab Results  Component Value Date   LDLDIRECT 109.6 10/26/2012    ? If it is from holiday food / eats a lean diet   Other labs ok   Patient Active Problem List  Diagnosis  . ONYCHOMYCOSIS  . PURE HYPERCHOLESTEROLEMIA  . ANXIETY  . TOBACCO ABUSE  . DEPRESSION  . GERD  .  OSTEOPENIA  . INSOMNIA  . Nonspecific (abnormal) findings on radiological and other examination of body structure  . COLONIC POLYPS, HYPERPLASTIC, HX OF  . FIBROCYSTIC BREAST DISEASE, HX OF  . NONSPCIFC ABN FINDING RAD & OTH EXAM LUNG FIELD  . Routine general medical examination at a health care facility  . Gynecologic exam normal   Past Medical History  Diagnosis Date  . Anxiety   . Depression   . GERD (gastroesophageal reflux disease)   . Arthritis   . Tobacco abuse   . Insomnia   . Chronic headaches   . IBS (irritable bowel syndrome)   . Colon polyps    Past Surgical History  Procedure Date  . Abdominal hysterectomy     bleeding, fibroid, endometriosis (total)  . Laparoscopy 1984  . Ct of chest 10/09    bronchiectasis   History  Substance Use Topics  . Smoking status: Current Every Day Smoker -- 1.0 packs/day    Types: Cigarettes  . Smokeless tobacco: Not on file  . Alcohol Use: Yes     Comment: occasional   Family  History  Problem Relation Age of Onset  . Cancer Mother     pancreatic, liver  . Cancer Father     colon  . Thyroid disease Sister   . Colon polyps Sister   . Irritable bowel syndrome Sister   . Cancer Maternal Aunt     breast  . Cancer Paternal Grandmother     thyroid  . Miscarriages / [place] Cousin   . Cancer Cousin     breast  . Diabetes Cousin   . Thyroid disease Sister   . Heart disease Other    Allergies  Allergen Reactions  . Alendronate Sodium     REACTION: muscle, joint pain, indigestion  . Doxycycline     REACTION: ? rash or n/v  . Penicillins   . Varenicline Tartrate     REACTION: nausea   Current Outpatient Prescriptions on File Prior to Visit  Medication Sig Dispense Refill  . Calcium Citrate-Vitamin D (CITRACAL/VITAMIN D) 250-200 MG-UNIT TABS Take 4 tablets by mouth daily.       . cetirizine (ZYRTEC) 10 MG tablet Take 10 mg by mouth daily.        Marland Kitchen escitalopram (LEXAPRO) 20 MG tablet Take 1 tablet (20 mg total) by  mouth daily.  90 tablet  3  . fish oil-omega-3 fatty acids 1000 MG capsule Take 2 g by mouth daily.       . Ginkgo Biloba 40 MG TABS Take 1 tablet by mouth as needed.       Marland Kitchen glucosamine-chondroitin 500-400 MG tablet With MSM Take 1 tablet by mouth 3 or 4 times a week.      . Lactobacillus (ACIDOPHILUS) 100 MG CAPS Take 1 capsule by mouth daily.        . Melatonin 1 MG TABS As directed at bedtime.       . montelukast (SINGULAIR) 10 MG tablet Take 1 tablet (10 mg total) by mouth at bedtime.  90 tablet  3  . Multiple Vitamin (MULTIVITAMIN) tablet Take 1 tablet by mouth daily.        Marland Kitchen omeprazole (PRILOSEC) 20 MG capsule TAKE 1 CAPSULE (20 MG TOTAL) BY MOUTH DAILY.  90 capsule  0  . Phenylephrine-Bromphen-DM (PHENYLEPHRINE COMPLEX PO) Take 1 tablet up to every 4 hours as needed       . Prenatal Vit-Fe Sulfate-FA (PRENATAL VITAMIN PO) Take 1 tablet by mouth daily.        . vitamin E 1000 UNIT capsule Take 1,000 Units by mouth daily. Takes a few times a week      . montelukast (SINGULAIR) 10 MG tablet Take 1 tablet (10 mg total) by mouth daily.  90 tablet  3     Review of Systems Review of Systems  Constitutional: Negative for fever, appetite change, fatigue and unexpected weight change.  Eyes: Negative for pain and visual disturbance.  Respiratory: Negative for cough and shortness of breath.   Cardiovascular: Negative for cp or palpitations    Gastrointestinal: Negative for nausea, diarrhea and constipation.  Genitourinary: Negative for urgency and frequency.  Skin: Negative for pallor or rash   Neurological: Negative for weakness, light-headedness, numbness and pos for  headaches.  Hematological: Negative for adenopathy. Does not bruise/bleed easily.  Psychiatric/Behavioral: Negative for dysphoric mood. The patient is not nervous/anxious.         Objective:   Physical Exam  Constitutional: She appears well-developed and well-nourished. No distress.  HENT:  Head: Normocephalic and  atraumatic.  Right Ear: External  ear normal.  Left Ear: External ear normal.  Nose: Nose normal.  Mouth/Throat: Oropharynx is clear and moist.       No sinus or temporal tenderness  Eyes: Conjunctivae normal and EOM are normal. Pupils are equal, round, and reactive to light. Right eye exhibits no discharge. Left eye exhibits no discharge. No scleral icterus.  Neck: Normal range of motion. Neck supple. No JVD present. Carotid bruit is not present. No thyromegaly present.  Cardiovascular: Normal rate, regular rhythm, normal heart sounds and intact distal pulses.  Exam reveals no gallop.   Pulmonary/Chest: Effort normal and breath sounds normal. No respiratory distress. She has no wheezes.  Abdominal: Soft. Bowel sounds are normal. She exhibits no distension, no abdominal bruit and no mass. There is no tenderness.  Genitourinary: No breast swelling, tenderness, discharge or bleeding.       Breast exam: No mass, nodules, thickening, tenderness, bulging, retraction, inflamation, nipple discharge or skin changes noted.  No axillary or clavicular LA.  Chaperoned exam.    Musculoskeletal: Normal range of motion. She exhibits no edema and no tenderness.  Lymphadenopathy:    She has no cervical adenopathy.  Neurological: She is alert. She has normal reflexes. No cranial nerve deficit. She exhibits normal muscle tone. Coordination normal.  Skin: Skin is warm and dry. No rash noted. No erythema. No pallor.  Psychiatric: She has a normal mood and affect.          Assessment & Plan:

## 2012-11-02 ENCOUNTER — Ambulatory Visit: Payer: Self-pay | Admitting: Family Medicine

## 2012-11-02 DIAGNOSIS — R51 Headache: Secondary | ICD-10-CM | POA: Diagnosis not present

## 2012-11-03 ENCOUNTER — Telehealth: Payer: Self-pay | Admitting: Family Medicine

## 2012-11-03 ENCOUNTER — Encounter: Payer: Self-pay | Admitting: Family Medicine

## 2012-11-03 DIAGNOSIS — R51 Headache: Secondary | ICD-10-CM

## 2012-11-03 NOTE — Telephone Encounter (Signed)
Neuro ref for headache

## 2012-11-03 NOTE — Telephone Encounter (Signed)
Message copied by Judy Pimple on Thu Nov 03, 2012  9:51 PM ------      Message from: Shon Millet      Created: Thu Nov 03, 2012  4:42 PM       Pt notified of CT results and does want a referral to a neurologist regarding her HA, advise her that Shirlee Limerick will call her to set that appt up

## 2012-12-06 DIAGNOSIS — IMO0001 Reserved for inherently not codable concepts without codable children: Secondary | ICD-10-CM | POA: Diagnosis not present

## 2012-12-23 ENCOUNTER — Other Ambulatory Visit: Payer: Self-pay | Admitting: Family Medicine

## 2012-12-24 ENCOUNTER — Other Ambulatory Visit: Payer: Self-pay | Admitting: Family Medicine

## 2013-02-01 DIAGNOSIS — G43119 Migraine with aura, intractable, without status migrainosus: Secondary | ICD-10-CM | POA: Diagnosis not present

## 2013-02-01 DIAGNOSIS — F17200 Nicotine dependence, unspecified, uncomplicated: Secondary | ICD-10-CM | POA: Diagnosis not present

## 2013-03-22 ENCOUNTER — Other Ambulatory Visit: Payer: Self-pay

## 2013-03-22 MED ORDER — MONTELUKAST SODIUM 10 MG PO TABS: 10.0000 mg | ORAL_TABLET | Freq: Every day | ORAL | Status: DC

## 2013-03-22 NOTE — Telephone Encounter (Signed)
Pt request refill [facility] until CPX 10/2013. Advised done.

## 2013-04-11 DIAGNOSIS — G43119 Migraine with aura, intractable, without status migrainosus: Secondary | ICD-10-CM | POA: Diagnosis not present

## 2013-04-11 DIAGNOSIS — F17200 Nicotine dependence, unspecified, uncomplicated: Secondary | ICD-10-CM | POA: Diagnosis not present

## 2013-04-23 ENCOUNTER — Other Ambulatory Visit: Payer: Self-pay | Admitting: Family Medicine

## 2013-06-17 ENCOUNTER — Other Ambulatory Visit: Payer: Self-pay | Admitting: Family Medicine

## 2013-09-04 ENCOUNTER — Other Ambulatory Visit: Payer: Self-pay

## 2013-09-04 DIAGNOSIS — Z1231 Encounter for screening mammogram for malignant neoplasm of breast: Secondary | ICD-10-CM

## 2013-09-12 ENCOUNTER — Ambulatory Visit
Admission: RE | Admit: 2013-09-12 | Discharge: 2013-09-12 | Disposition: A | Discharge status: Home / Self Care | Payer: Medicare Other | Source: Ambulatory Visit

## 2013-09-12 DIAGNOSIS — Z1231 Encounter for screening mammogram for malignant neoplasm of breast: Secondary | ICD-10-CM

## 2013-09-20 ENCOUNTER — Other Ambulatory Visit: Payer: Self-pay | Admitting: Family Medicine

## 2013-10-31 ENCOUNTER — Telehealth: Payer: Self-pay | Admitting: Family Medicine

## 2013-10-31 DIAGNOSIS — M949 Disorder of cartilage, unspecified: Principal | ICD-10-CM

## 2013-10-31 DIAGNOSIS — M899 Disorder of bone, unspecified: Secondary | ICD-10-CM

## 2013-10-31 DIAGNOSIS — E7800 Pure hypercholesterolemia, unspecified: Secondary | ICD-10-CM

## 2013-10-31 DIAGNOSIS — Z0000 Encounter for general adult medical examination without abnormal findings: Secondary | ICD-10-CM

## 2013-10-31 NOTE — Telephone Encounter (Signed)
Message copied by Abner Greenspan on Tue Oct 31, 2013  8:06 AM ------      Message from: Ellamae Sia      Created: Thu Oct 19, 2013 12:28 PM      Regarding: Lab orders for Wednesday, 1.21.15       Patient is scheduled for CPX labs, please order future labs, Thanks , Terri       ------

## 2013-11-01 ENCOUNTER — Other Ambulatory Visit: Payer: Medicare Other

## 2013-11-01 ENCOUNTER — Other Ambulatory Visit (INDEPENDENT_AMBULATORY_CARE_PROVIDER_SITE_OTHER): Payer: Medicare Other

## 2013-11-01 DIAGNOSIS — M899 Disorder of bone, unspecified: Secondary | ICD-10-CM

## 2013-11-01 DIAGNOSIS — Z0000 Encounter for general adult medical examination without abnormal findings: Secondary | ICD-10-CM | POA: Diagnosis not present

## 2013-11-01 DIAGNOSIS — M949 Disorder of cartilage, unspecified: Secondary | ICD-10-CM | POA: Diagnosis not present

## 2013-11-01 DIAGNOSIS — E7800 Pure hypercholesterolemia, unspecified: Secondary | ICD-10-CM

## 2013-11-01 LAB — LIPID PANEL
Cholesterol: 216 mg/dL — ABNORMAL HIGH (ref 0–200)
HDL: 66.4 mg/dL (ref >39.00)
Total CHOL/HDL Ratio: 3
Triglycerides: 75 mg/dL (ref 0.0–149.0)
VLDL: 15 mg/dL (ref 0.0–40.0)

## 2013-11-01 LAB — CBC WITH DIFFERENTIAL/PLATELET
BASOS ABS: 0 10*3/uL (ref 0.0–0.1)
Basophils Relative: 0.4 % (ref 0.0–3.0)
Eosinophils Absolute: 0.2 10*3/uL (ref 0.0–0.7)
Eosinophils Relative: 2.8 % (ref 0.0–5.0)
HCT: 44.3 % (ref 36.0–46.0)
HEMOGLOBIN: 15 g/dL (ref 12.0–15.0)
LYMPHS PCT: 26 % (ref 12.0–46.0)
Lymphs Abs: 1.7 10*3/uL (ref 0.7–4.0)
MCHC: 33.9 g/dL (ref 30.0–36.0)
MCV: 93.5 fl (ref 78.0–100.0)
MONOS PCT: 10.1 % (ref 3.0–12.0)
Monocytes Absolute: 0.6 10*3/uL (ref 0.1–1.0)
Neutro Abs: 3.9 10*3/uL (ref 1.4–7.7)
Neutrophils Relative %: 60.7 % (ref 43.0–77.0)
PLATELETS: 261 10*3/uL (ref 150.0–400.0)
RBC: 4.73 Mil/uL (ref 3.87–5.11)
RDW: 13.4 % (ref 11.5–14.6)
WBC: 6.4 10*3/uL (ref 4.5–10.5)

## 2013-11-01 LAB — COMPREHENSIVE METABOLIC PANEL
ALT: 15 U/L (ref 0–35)
AST: 25 U/L (ref 0–37)
Albumin: 4.1 g/dL (ref 3.5–5.2)
Alkaline Phosphatase: 82 U/L (ref 39–117)
BUN: 14 mg/dL (ref 6–23)
CALCIUM: 9.4 mg/dL (ref 8.4–10.5)
CHLORIDE: 103 meq/L (ref 96–112)
CO2: 30 meq/L (ref 19–32)
Creatinine, Ser: 0.8 mg/dL (ref 0.4–1.2)
GFR: 78.48 mL/min (ref >60.00)
Glucose, Bld: 96 mg/dL (ref 70–99)
Potassium: 4.5 mEq/L (ref 3.5–5.1)
SODIUM: 140 meq/L (ref 135–145)
TOTAL PROTEIN: 7.4 g/dL (ref 6.0–8.3)
Total Bilirubin: 0.4 mg/dL (ref 0.3–1.2)

## 2013-11-01 LAB — TSH: TSH: 2.16 u[IU]/mL (ref 0.35–5.50)

## 2013-11-01 LAB — LDL CHOLESTEROL, DIRECT: LDL DIRECT: 138.2 mg/dL

## 2013-11-02 LAB — VITAMIN D 25 HYDROXY (VIT D DEFICIENCY, FRACTURES): VIT D 25 HYDROXY: 53 ng/mL (ref 30–89)

## 2013-11-07 ENCOUNTER — Encounter: Payer: Self-pay | Admitting: Family Medicine

## 2013-11-07 ENCOUNTER — Ambulatory Visit (INDEPENDENT_AMBULATORY_CARE_PROVIDER_SITE_OTHER): Payer: Medicare Other | Admitting: Family Medicine

## 2013-11-07 VITALS — BP 120/68 | HR 93 | Temp 98.1°F | Ht 63.0 in | Wt 140.0 lb

## 2013-11-07 DIAGNOSIS — Z0000 Encounter for general adult medical examination without abnormal findings: Secondary | ICD-10-CM

## 2013-11-07 DIAGNOSIS — M899 Disorder of bone, unspecified: Secondary | ICD-10-CM

## 2013-11-07 DIAGNOSIS — M949 Disorder of cartilage, unspecified: Secondary | ICD-10-CM

## 2013-11-07 DIAGNOSIS — F17200 Nicotine dependence, unspecified, uncomplicated: Secondary | ICD-10-CM | POA: Diagnosis not present

## 2013-11-07 DIAGNOSIS — E7800 Pure hypercholesterolemia, unspecified: Secondary | ICD-10-CM | POA: Diagnosis not present

## 2013-11-07 MED ORDER — MONTELUKAST SODIUM 10 MG PO TABS: 10.0000 mg | ORAL_TABLET | Freq: Every day | ORAL | Status: DC

## 2013-11-07 MED ORDER — ESCITALOPRAM OXALATE 20 MG PO TABS: 20.0000 mg | ORAL_TABLET | Freq: Every day | ORAL | Status: DC

## 2013-11-07 MED ORDER — OMEPRAZOLE 20 MG PO CPDR: 20.0000 mg | DELAYED_RELEASE_CAPSULE | Freq: Every day | ORAL | Status: DC

## 2013-11-07 NOTE — Patient Instructions (Signed)
Take care of yourself  Keep exercising and watch diet for fat and cholesterol   Fat and Cholesterol Control Diet Fat and cholesterol levels in your blood and organs are influenced by your diet. High levels of fat and cholesterol may lead to diseases of the heart, small and large blood vessels, gallbladder, liver, and pancreas. CONTROLLING FAT AND CHOLESTEROL WITH DIET Although exercise and lifestyle factors are important, your diet is key. That is because certain foods are known to raise cholesterol and others to lower it. The goal is to balance foods for their effect on cholesterol and more importantly, to replace saturated and trans fat with other types of fat, such as monounsaturated fat, polyunsaturated fat, and omega-3 fatty acids. On average, a person should consume no more than 15 to 17 g of saturated fat daily. Saturated and trans fats are considered "bad" fats, and they will raise LDL cholesterol. Saturated fats are primarily found in animal products such as meats, butter, and cream. However, that does not mean you need to give up all your favorite foods. Today, there are good tasting, low-fat, low-cholesterol substitutes for most of the things you like to eat. Choose low-fat or nonfat alternatives. Choose round or loin cuts of red meat. These types of cuts are lowest in fat and cholesterol. Chicken (without the skin), fish, veal, and ground [place] breast are great choices. Eliminate fatty meats, such as hot dogs and salami. Even shellfish have little or no saturated fat. Have a 3 oz (85 g) portion when you eat lean meat, poultry, or fish. Trans fats are also called "partially hydrogenated oils." They are oils that have been scientifically manipulated so that they are solid at room temperature resulting in a longer shelf life and improved taste and texture of foods in which they are added. Trans fats are found in stick margarine, some tub margarines, cookies, crackers, and baked goods.  When baking  and cooking, oils are a great substitute for butter. The monounsaturated oils are especially beneficial since it is believed they lower LDL and raise HDL. The oils you should avoid entirely are saturated tropical oils, such as coconut and palm.  Remember to eat a lot from food groups that are naturally free of saturated and trans fat, including fish, fruit, vegetables, beans, grains (barley, rice, couscous, bulgur wheat), and pasta (without cream sauces).  IDENTIFYING FOODS THAT LOWER FAT AND CHOLESTEROL  Soluble fiber may lower your cholesterol. This type of fiber is found in fruits such as apples, vegetables such as broccoli, potatoes, and carrots, legumes such as beans, peas, and lentils, and grains such as barley. Foods fortified with plant sterols (phytosterol) may also lower cholesterol. You should eat at least 2 g per day of these foods for a cholesterol lowering effect.  Read package labels to identify low-saturated fats, trans fat free, and low-fat foods at the supermarket. Select cheeses that have only 2 to 3 g saturated fat per ounce. Use a heart-healthy tub margarine that is free of trans fats or partially hydrogenated oil. When buying baked goods (cookies, crackers), avoid partially hydrogenated oils. Breads and muffins should be made from whole grains (whole-wheat or whole oat flour, instead of "flour" or "enriched flour"). Buy non-creamy canned soups with reduced salt and no added fats.  FOOD PREPARATION TECHNIQUES  Never deep-fry. If you must fry, either stir-fry, which uses very little fat, or use non-stick cooking sprays. When possible, broil, bake, or roast meats, and steam vegetables. Instead of putting butter or margarine  on vegetables, use lemon and herbs, applesauce, and cinnamon (for squash and sweet potatoes). Use nonfat yogurt, salsa, and low-fat dressings for salads.  LOW-SATURATED FAT / LOW-FAT FOOD SUBSTITUTES Meats / Saturated Fat (g)  Avoid: Steak, marbled (3 oz/85 g) / 11  g  Choose: Steak, lean (3 oz/85 g) / 4 g  Avoid: Hamburger (3 oz/85 g) / 7 g  Choose: Hamburger, lean (3 oz/85 g) / 5 g  Avoid: Ham (3 oz/85 g) / 6 g  Choose: Ham, lean cut (3 oz/85 g) / 2.4 g  Avoid: Chicken, with skin, dark meat (3 oz/85 g) / 4 g  Choose: Chicken, skin removed, dark meat (3 oz/85 g) / 2 g  Avoid: Chicken, with skin, light meat (3 oz/85 g) / 2.5 g  Choose: Chicken, skin removed, light meat (3 oz/85 g) / 1 g Dairy / Saturated Fat (g)  Avoid: Whole milk (1 cup) / 5 g  Choose: Low-fat milk, 2% (1 cup) / 3 g  Choose: Low-fat milk, 1% (1 cup) / 1.5 g  Choose: Skim milk (1 cup) / 0.3 g  Avoid: Hard cheese (1 oz/28 g) / 6 g  Choose: Skim milk cheese (1 oz/28 g) / 2 to 3 g  Avoid: Cottage cheese, 4% fat (1 cup) / 6.5 g  Choose: Low-fat cottage cheese, 1% fat (1 cup) / 1.5 g  Avoid: Ice cream (1 cup) / 9 g  Choose: Sherbet (1 cup) / 2.5 g  Choose: Nonfat frozen yogurt (1 cup) / 0.3 g  Choose: Frozen fruit bar / trace  Avoid: Whipped cream (1 tbs) / 3.5 g  Choose: Nondairy whipped topping (1 tbs) / 1 g Condiments / Saturated Fat (g)  Avoid: Mayonnaise (1 tbs) / 2 g  Choose: Low-fat mayonnaise (1 tbs) / 1 g  Avoid: Butter (1 tbs) / 7 g  Choose: Extra light margarine (1 tbs) / 1 g  Avoid: Coconut oil (1 tbs) / 11.8 g  Choose: Olive oil (1 tbs) / 1.8 g  Choose: Corn oil (1 tbs) / 1.7 g  Choose: Safflower oil (1 tbs) / 1.2 g  Choose: Sunflower oil (1 tbs) / 1.4 g  Choose: Soybean oil (1 tbs) / 2.4 g  Choose: Canola oil (1 tbs) / 1 g Document Released: 09/28/2005 Document Revised: 01/23/2013 Document Reviewed: 03/19/2011 Exit[employer] Patient Information 2014 [place], [place].

## 2013-11-07 NOTE — Progress Notes (Signed)
Subjective:    Patient ID: Cheryl Aguirre, female    DOB: 1946/09/06, 68 y.o.   MRN: [id number]  HPI I have personally reviewed the Medicare Annual Wellness questionnaire and have noted 1. The patient's medical and social history 2. Their use of alcohol, tobacco or illicit drugs 3. Their current medications and supplements 4. The patient's functional ability including ADL's, fall risks, home safety risks and hearing or visual             impairment. 5. Diet and physical activities 6. Evidence for depression or mood disorders  The patients weight, height, BMI have been recorded in the chart and visual acuity is per eye clinic.  I have made referrals, counseling and provided education to the patient based review of the above and I have provided the pt with a written personalized care plan for preventive services.  Wt is up 10 lb with bmi of 24 She was surprised by that - she is eating well and exercising   See scanned forms.  Routine anticipatory guidance given to patient.  See health maintenance. Flu 10/14 Shingles has not had and not interested in shingles vaccine  PNA 1/14 Tetanus shot 10/11 Colonoscopy 5/11 -5 year recall due to family history  Breast cancer screening mammogram 12/14  Self exam - no lumps or changes  Advance directive- has a living will set up  Cognitive function addressed- see scanned forms- and if abnormal then additional documentation follows.  No significant problems   PMH and SH reviewed  Meds, vitals, and allergies reviewed.   ROS: See HPI.  Otherwise negative.    Osteopenia dexa 9/12- she wants to hold off on more dexa  Getting exercise also  Intol of fosamax  D level is 53  Smoking-still thinks about quitting - but not ready    Labs:  Lab Results  Component Value Date   WBC 6.4 11/01/2013   HGB 15.0 11/01/2013   HCT 44.3 11/01/2013   MCV 93.5 11/01/2013   PLT 261.0 11/01/2013      Chemistry      Component Value Date/Time   NA 140  11/01/2013 1018   K 4.5 11/01/2013 1018   CL 103 11/01/2013 1018   CO2 30 11/01/2013 1018   BUN 14 11/01/2013 1018   CREATININE 0.8 11/01/2013 1018      Component Value Date/Time   CALCIUM 9.4 11/01/2013 1018   ALKPHOS 82 11/01/2013 1018   AST 25 11/01/2013 1018   ALT 15 11/01/2013 1018   BILITOT 0.4 11/01/2013 1018     glucose 96  Hyperlipidemia Lab Results  Component Value Date   CHOL 216* 11/01/2013   CHOL 211* 10/26/2012   CHOL 172 09/08/2011   Lab Results  Component Value Date   HDL 66.40 11/01/2013   HDL 66.90 10/26/2012   HDL 72.90 09/08/2011   Lab Results  Component Value Date   LDLCALC 85 09/08/2011   LDLCALC 112* 07/21/2010   LDLCALC 98 07/17/2008   Lab Results  Component Value Date   TRIG 75.0 11/01/2013   TRIG 106.0 10/26/2012   TRIG 72.0 09/08/2011   Lab Results  Component Value Date   CHOLHDL 3 11/01/2013   CHOLHDL 3 10/26/2012   CHOLHDL 2 09/08/2011   Lab Results  Component Value Date   LDLDIRECT 138.2 11/01/2013   LDLDIRECT 109.6 10/26/2012    ? If higher fat diet  Eats lean - but more beef (lean beef)- but usually chicken and fish  Lab Results  Component Value Date   TSH 2.16 11/01/2013    Patient Active Problem List   Diagnosis Date Noted  . Encounter for Medicare annual wellness exam 10/31/2013  . Headache 11/01/2012  . Gynecologic exam normal 09/15/2011  . Routine general medical examination at a health care facility 09/07/2011  . ONYCHOMYCOSIS 07/24/2010  . PURE HYPERCHOLESTEROLEMIA 07/21/2010  . COLONIC POLYPS, HYPERPLASTIC, HX OF 02/18/2010  . Nonspecific (abnormal) findings on radiological and other examination of body structure 08/02/2008  . [place] LUNG FIELD 08/02/2008  . OSTEOPENIA 07/20/2007  . ANXIETY 02/11/2007  . TOBACCO ABUSE 02/11/2007  . DEPRESSION 02/11/2007  . GERD 02/11/2007  . INSOMNIA 02/11/2007  . FIBROCYSTIC BREAST DISEASE, HX OF 02/11/2007   Past Medical History  Diagnosis Date  .  Anxiety   . Depression   . GERD (gastroesophageal reflux disease)   . Arthritis   . Tobacco abuse   . Insomnia   . Chronic headaches   . IBS (irritable bowel syndrome)   . Colon polyps    Past Surgical History  Procedure Laterality Date  . Abdominal hysterectomy      bleeding, fibroid, endometriosis (total)  . Laparoscopy  1984  . Ct of chest  10/09    bronchiectasis   History  Substance Use Topics  . Smoking status: Current Every Day Smoker -- 1.00 packs/day    Types: Cigarettes  . Smokeless tobacco: Never Used  . Alcohol Use: Yes     Comment: occasional   Family History  Problem Relation Age of Onset  . Cancer Mother     pancreatic, liver  . Cancer Father     colon  . Thyroid disease Sister   . Colon polyps Sister   . Irritable bowel syndrome Sister   . Cancer Maternal Aunt     breast  . Cancer Paternal Grandmother     thyroid  . Miscarriages / [place] Cousin   . Cancer Cousin     breast  . Diabetes Cousin   . Thyroid disease Sister   . Heart disease Other    Allergies  Allergen Reactions  . Alendronate Sodium     REACTION: muscle, joint pain, indigestion  . Doxycycline     REACTION: ? rash or n/v  . Penicillins   . Varenicline Tartrate     REACTION: nausea   Current Outpatient Prescriptions on File Prior to Visit  Medication Sig Dispense Refill  . Calcium Citrate-Vitamin D (CITRACAL/VITAMIN D) 250-200 MG-UNIT TABS Take 4 tablets by mouth daily.       . cetirizine (ZYRTEC) 10 MG tablet Take 10 mg by mouth daily.        Marland Kitchen escitalopram (LEXAPRO) 20 MG tablet TAKE 1 TABLET BY MOUTH DAILY  90 tablet  1  . fish oil-omega-3 fatty acids 1000 MG capsule Take 2 g by mouth daily.       . Lactobacillus (ACIDOPHILUS) 100 MG CAPS Take 1 capsule by mouth daily.        . Melatonin 1 MG TABS As directed at bedtime.       . montelukast (SINGULAIR) 10 MG tablet Take 1 tablet (10 mg total) by mouth at bedtime.  90 tablet  2  . Multiple Vitamin (MULTIVITAMIN) tablet  Take 1 tablet by mouth daily.        Marland Kitchen omeprazole (PRILOSEC) 20 MG capsule TAKE 1 CAPSULE BY MOUTH DAILY.  90 capsule  1  . Phenylephrine-Bromphen-DM (PHENYLEPHRINE COMPLEX PO)  Take 1 tablet up to every 4 hours as needed        No current facility-administered medications on file prior to visit.      Review of Systems Review of Systems  Constitutional: Negative for fever, appetite change, fatigue and unexpected weight change.  Eyes: Negative for pain and visual disturbance.  Respiratory: Negative for cough and shortness of breath.   Cardiovascular: Negative for cp or palpitations    Gastrointestinal: Negative for nausea, diarrhea and constipation.  Genitourinary: Negative for urgency and frequency.  Skin: Negative for pallor or rash   Neurological: Negative for weakness, light-headedness, numbness and headaches.  Hematological: Negative for adenopathy. Does not bruise/bleed easily.  Psychiatric/Behavioral: Negative for dysphoric mood. The patient is not nervous/anxious.         Objective:   Physical Exam  Constitutional: She appears well-developed and well-nourished. No distress.  HENT:  Head: Normocephalic and atraumatic.  Right Ear: External ear normal.  Left Ear: External ear normal.  Mouth/Throat: Oropharynx is clear and moist.  Eyes: Conjunctivae and EOM are normal. Pupils are equal, round, and reactive to light. No scleral icterus.  Neck: Normal range of motion. Neck supple. No JVD present. Carotid bruit is not present. No thyromegaly present.  Cardiovascular: Normal rate, regular rhythm, normal heart sounds and intact distal pulses.  Exam reveals no gallop.   Pulmonary/Chest: Effort normal and breath sounds normal. No respiratory distress. She has no wheezes. She exhibits no tenderness.  Abdominal: Soft. Bowel sounds are normal. She exhibits no distension, no abdominal bruit and no mass. There is no tenderness.  Genitourinary: No breast swelling, tenderness, discharge or  bleeding.  Breast exam: No mass, nodules, thickening, tenderness, bulging, retraction, inflamation, nipple discharge or skin changes noted.  No axillary or clavicular LA.    Musculoskeletal: Normal range of motion. She exhibits no edema and no tenderness.  Lymphadenopathy:    She has no cervical adenopathy.  Neurological: She is alert. She has normal reflexes. No cranial nerve deficit. She exhibits normal muscle tone. Coordination normal.  Skin: Skin is warm and dry. No rash noted. No erythema. No pallor.  Psychiatric: She has a normal mood and affect.          Assessment & Plan:

## 2013-11-08 NOTE — Assessment & Plan Note (Signed)
Disc in detail risks of smoking and possible outcomes including copd, vascular/ heart disease, cancer , respiratory and sinus infections  Pt voices understanding Pt has cut down but is not yet ready to quit

## 2013-11-08 NOTE — Assessment & Plan Note (Signed)
Lipids are up  Disc goals for lipids and reasons to control them Rev labs with pt Rev low sat fat diet in detail Handout given Will follow

## 2013-11-08 NOTE — Assessment & Plan Note (Signed)
Reviewed health habits including diet and exercise and skin cancer prevention Reviewed appropriate screening tests for age  Also reviewed health mt list, fam hx and immunization status , as well as social and family history   See HPI Labs reviewed  

## 2013-11-08 NOTE — Assessment & Plan Note (Signed)
Intolerant of fosamax Smoker  She is due for dexa but wants to hold off right now (was 2012) No fragility fractures  Disc need for calcium/ vitamin D/ wt bearing exercise and bone density test every 2 y to monitor Disc safety/ fracture risk in detail   Rev need to quit smoking

## 2013-11-14 ENCOUNTER — Telehealth: Payer: Self-pay | Admitting: Family Medicine

## 2013-11-14 NOTE — Telephone Encounter (Signed)
Relevant patient education mailed to patient.  

## 2013-12-14 DIAGNOSIS — H2510 Age-related nuclear cataract, unspecified eye: Secondary | ICD-10-CM | POA: Diagnosis not present

## 2014-08-31 ENCOUNTER — Other Ambulatory Visit: Payer: Self-pay

## 2014-08-31 DIAGNOSIS — Z1231 Encounter for screening mammogram for malignant neoplasm of breast: Secondary | ICD-10-CM

## 2014-09-13 ENCOUNTER — Encounter (INDEPENDENT_AMBULATORY_CARE_PROVIDER_SITE_OTHER): Payer: Self-pay

## 2014-09-13 ENCOUNTER — Ambulatory Visit
Admission: RE | Admit: 2014-09-13 | Discharge: 2014-09-13 | Disposition: A | Discharge status: Home / Self Care | Payer: Medicare Other | Source: Ambulatory Visit

## 2014-09-13 DIAGNOSIS — Z1231 Encounter for screening mammogram for malignant neoplasm of breast: Secondary | ICD-10-CM | POA: Diagnosis not present

## 2014-09-17 ENCOUNTER — Encounter: Payer: Self-pay | Admitting: *Deleted

## 2014-09-25 ENCOUNTER — Other Ambulatory Visit: Payer: Self-pay | Admitting: Family Medicine

## 2014-09-25 NOTE — Telephone Encounter (Signed)
Electronic refill request, please advise  

## 2014-09-25 NOTE — Telephone Encounter (Signed)
Please refill for 3 mo  

## 2014-09-26 ENCOUNTER — Other Ambulatory Visit: Payer: Self-pay | Admitting: Family Medicine

## 2014-09-26 NOTE — Telephone Encounter (Signed)
done

## 2014-10-05 ENCOUNTER — Other Ambulatory Visit: Payer: Self-pay | Admitting: Family Medicine

## 2014-10-29 DIAGNOSIS — H2513 Age-related nuclear cataract, bilateral: Secondary | ICD-10-CM | POA: Diagnosis not present

## 2014-11-05 ENCOUNTER — Encounter: Payer: Self-pay | Admitting: Internal Medicine

## 2014-11-05 ENCOUNTER — Ambulatory Visit (INDEPENDENT_AMBULATORY_CARE_PROVIDER_SITE_OTHER): Payer: Medicare Other | Admitting: Internal Medicine

## 2014-11-05 VITALS — BP 114/76 | HR 92 | Temp 97.7°F | Wt 138.0 lb

## 2014-11-05 DIAGNOSIS — R300 Dysuria: Secondary | ICD-10-CM | POA: Diagnosis not present

## 2014-11-05 DIAGNOSIS — N390 Urinary tract infection, site not specified: Secondary | ICD-10-CM | POA: Diagnosis not present

## 2014-11-05 DIAGNOSIS — R319 Hematuria, unspecified: Secondary | ICD-10-CM | POA: Diagnosis not present

## 2014-11-05 LAB — POCT URINALYSIS DIPSTICK
BILIRUBIN UA: NEGATIVE
GLUCOSE UA: NEGATIVE
KETONES UA: NEGATIVE
Nitrite, UA: NEGATIVE
PROTEIN UA: POSITIVE
Spec Grav, UA: 1.015
Urobilinogen, UA: NEGATIVE
pH, UA: 7.5

## 2014-11-05 MED ORDER — CIPROFLOXACIN HCL 500 MG PO TABS: 500.0000 mg | ORAL_TABLET | Freq: Two times a day (BID) | ORAL | Status: DC

## 2014-11-05 NOTE — Patient Instructions (Signed)

## 2014-11-05 NOTE — Progress Notes (Signed)
Pre visit review using our clinic review tool, if applicable. No additional management support is needed unless otherwise documented below in the visit note. 

## 2014-11-05 NOTE — Addendum Note (Signed)
Addended by: Lurlean Nanny on: 11/05/2014 01:21 PM   Modules accepted: Orders

## 2014-11-05 NOTE — Progress Notes (Signed)
HPI  Pt presents to the clinic today with c/o urgency, frequency, dysuria and low back pain. She reports this started 2 days ago. She has seen some blood in her urine. She denies fever, chills or body aches. She has tried some cranberry juice OTC without any relief. She has been getting UTI's since menopause.   Review of Systems  Past Medical History  Diagnosis Date  . Anxiety   . Depression   . GERD (gastroesophageal reflux disease)   . Arthritis   . Tobacco abuse   . Insomnia   . Chronic headaches   . IBS (irritable bowel syndrome)   . Colon polyps     Family History  Problem Relation Age of Onset  . Cancer Mother     pancreatic, liver  . Cancer Father     colon  . Thyroid disease Sister   . Colon polyps Sister   . Irritable bowel syndrome Sister   . Cancer Maternal Aunt     breast  . Cancer Paternal Grandmother     thyroid  . Miscarriages / [place] Cousin   . Cancer Cousin     breast  . Diabetes Cousin   . Thyroid disease Sister   . Heart disease Other     History   Social History  . Marital Status: Married    Spouse Name: N/A    Number of Children: 1  . Years of Education: N/A   Occupational History  . Unemployed    Social History Main Topics  . Smoking status: Current Every Day Smoker -- 1.00 packs/day    Types: Cigarettes  . Smokeless tobacco: Never Used  . Alcohol Use: 0.0 oz/week    0 Not specified per week     Comment: occasional  . Drug Use: No  . Sexual Activity: Not on file   Other Topics Concern  . Not on file   Social History Narrative   Daily Caffeine Use:  2 daily    Allergies  Allergen Reactions  . Alendronate Sodium     REACTION: muscle, joint pain, indigestion  . Doxycycline     REACTION: ? rash or n/v  . Penicillins   . Varenicline Tartrate     REACTION: nausea    Constitutional: Denies fever, malaise, fatigue, headache or abrupt weight changes.   GU: Pt reports urgency, frequency and pain with urination. Denies  blood in urine, odor or discharge. Skin: Denies redness, rashes, lesions or ulcercations.   No other specific complaints in a complete review of systems (except as listed in HPI above).    Objective:   Physical Exam  BP 114/76 mmHg  Pulse 92  Temp(Src) 97.7 F (36.5 C) (Oral)  Wt 138 lb (62.596 kg)  SpO2 98% Wt Readings from Last 3 Encounters:  11/05/14 138 lb (62.596 kg)  11/07/13 140 lb (63.504 kg)  11/01/12 130 lb 12 oz (59.308 kg)    General: Appears her stated age, well developed, well nourished in NAD. Cardiovascular: Normal rate and rhythm. S1,S2 noted.  No murmur, rubs or gallops noted.  Pulmonary/Chest: Normal effort and positive vesicular breath sounds. No respiratory distress. No wheezes, rales or ronchi noted.  Abdomen: Soft. Normal bowel sounds, no bruits noted. No distention or masses noted. Liver, spleen and kidneys non palpable. Tender to palpation over the bladder area. No CVA tenderness.      Assessment & Plan:   Urgency, Frequency, Dysuria secondary to UTI  Urinalysis: 3 + leuks, 3+ blood, pos protein Will  send urine culture eRx sent if for Cipro 500 mg BID x 5 days OK to take AZO OTC Drink plenty of fluids  RTC as needed or if symptoms persist.

## 2014-11-08 LAB — URINE CULTURE: Colony Count: 50000

## 2014-11-11 ENCOUNTER — Telehealth: Payer: Self-pay | Admitting: Family Medicine

## 2014-11-11 DIAGNOSIS — E7800 Pure hypercholesterolemia, unspecified: Secondary | ICD-10-CM

## 2014-11-11 DIAGNOSIS — M8580 Other specified disorders of bone density and structure, unspecified site: Secondary | ICD-10-CM

## 2014-11-11 DIAGNOSIS — Z0000 Encounter for general adult medical examination without abnormal findings: Secondary | ICD-10-CM

## 2014-11-11 NOTE — Telephone Encounter (Signed)
-----   Message from Ellamae Sia sent at 11/07/2014  3:49 PM EST ----- Regarding: Lab orders for Monday, 2.1.16 Patient is scheduled for CPX labs, please order future labs, Thanks , Karna Christmas

## 2014-11-12 ENCOUNTER — Other Ambulatory Visit (INDEPENDENT_AMBULATORY_CARE_PROVIDER_SITE_OTHER): Payer: Medicare Other

## 2014-11-12 DIAGNOSIS — E7800 Pure hypercholesterolemia, unspecified: Secondary | ICD-10-CM

## 2014-11-12 DIAGNOSIS — M8580 Other specified disorders of bone density and structure, unspecified site: Secondary | ICD-10-CM

## 2014-11-12 DIAGNOSIS — Z0000 Encounter for general adult medical examination without abnormal findings: Secondary | ICD-10-CM | POA: Diagnosis not present

## 2014-11-12 LAB — COMPREHENSIVE METABOLIC PANEL
ALK PHOS: 90 U/L (ref 39–117)
ALT: 11 U/L (ref 0–35)
AST: 18 U/L (ref 0–37)
Albumin: 4.1 g/dL (ref 3.5–5.2)
BILIRUBIN TOTAL: 0.4 mg/dL (ref 0.2–1.2)
BUN: 18 mg/dL (ref 6–23)
CALCIUM: 9.6 mg/dL (ref 8.4–10.5)
CO2: 28 mEq/L (ref 19–32)
Chloride: 102 mEq/L (ref 96–112)
Creatinine, Ser: 0.87 mg/dL (ref 0.40–1.20)
GFR: 68.97 mL/min (ref >60.00)
GLUCOSE: 99 mg/dL (ref 70–99)
Potassium: 4.9 mEq/L (ref 3.5–5.1)
Sodium: 137 mEq/L (ref 135–145)
TOTAL PROTEIN: 7 g/dL (ref 6.0–8.3)

## 2014-11-12 LAB — VITAMIN D 25 HYDROXY (VIT D DEFICIENCY, FRACTURES): VITD: 56.85 ng/mL (ref 30.00–100.00)

## 2014-11-12 LAB — CBC WITH DIFFERENTIAL/PLATELET
BASOS ABS: 0 10*3/uL (ref 0.0–0.1)
BASOS PCT: 0.5 % (ref 0.0–3.0)
Eosinophils Absolute: 0.2 10*3/uL (ref 0.0–0.7)
Eosinophils Relative: 2.8 % (ref 0.0–5.0)
HEMATOCRIT: 42.2 % (ref 36.0–46.0)
Hemoglobin: 14.1 g/dL (ref 12.0–15.0)
Lymphocytes Relative: 22.7 % (ref 12.0–46.0)
Lymphs Abs: 1.9 10*3/uL (ref 0.7–4.0)
MCHC: 33.4 g/dL (ref 30.0–36.0)
MCV: 92.7 fl (ref 78.0–100.0)
Monocytes Absolute: 0.9 10*3/uL (ref 0.1–1.0)
Monocytes Relative: 11 % (ref 3.0–12.0)
Neutro Abs: 5.1 10*3/uL (ref 1.4–7.7)
Neutrophils Relative %: 63 % (ref 43.0–77.0)
Platelets: 277 10*3/uL (ref 150.0–400.0)
RBC: 4.55 Mil/uL (ref 3.87–5.11)
RDW: 13.6 % (ref 11.5–15.5)
WBC: 8.1 10*3/uL (ref 4.0–10.5)

## 2014-11-12 LAB — TSH: TSH: 3.39 u[IU]/mL (ref 0.35–4.50)

## 2014-11-12 LAB — LIPID PANEL
CHOL/HDL RATIO: 3
CHOLESTEROL: 171 mg/dL (ref 0–200)
HDL: 64.9 mg/dL (ref >39.00)
LDL Cholesterol: 90 mg/dL (ref 0–99)
NONHDL: 106.1
TRIGLYCERIDES: 79 mg/dL (ref 0.0–149.0)
VLDL: 15.8 mg/dL (ref 0.0–40.0)

## 2014-11-19 ENCOUNTER — Ambulatory Visit (INDEPENDENT_AMBULATORY_CARE_PROVIDER_SITE_OTHER): Payer: Medicare Other | Admitting: Family Medicine

## 2014-11-19 ENCOUNTER — Encounter: Payer: Self-pay | Admitting: Family Medicine

## 2014-11-19 VITALS — BP 122/78 | HR 76 | Temp 98.1°F | Ht 63.0 in | Wt 137.4 lb

## 2014-11-19 DIAGNOSIS — F17200 Nicotine dependence, unspecified, uncomplicated: Secondary | ICD-10-CM

## 2014-11-19 DIAGNOSIS — E7800 Pure hypercholesterolemia, unspecified: Secondary | ICD-10-CM

## 2014-11-19 DIAGNOSIS — Z23 Encounter for immunization: Secondary | ICD-10-CM

## 2014-11-19 DIAGNOSIS — M8580 Other specified disorders of bone density and structure, unspecified site: Secondary | ICD-10-CM | POA: Diagnosis not present

## 2014-11-19 DIAGNOSIS — Z720 Tobacco use: Secondary | ICD-10-CM | POA: Diagnosis not present

## 2014-11-19 DIAGNOSIS — Z0000 Encounter for general adult medical examination without abnormal findings: Secondary | ICD-10-CM | POA: Diagnosis not present

## 2014-11-19 MED ORDER — ESCITALOPRAM OXALATE 20 MG PO TABS: 20.0000 mg | ORAL_TABLET | Freq: Every day | ORAL | Status: DC

## 2014-11-19 MED ORDER — OMEPRAZOLE 20 MG PO CPDR: 20.0000 mg | DELAYED_RELEASE_CAPSULE | Freq: Every day | ORAL | Status: DC

## 2014-11-19 MED ORDER — MONTELUKAST SODIUM 10 MG PO TABS: 10.0000 mg | ORAL_TABLET | Freq: Every day | ORAL | Status: DC

## 2014-11-19 NOTE — Progress Notes (Signed)
Subjective:    Patient ID: Cheryl Aguirre, female    DOB: Apr 08, 1946, 69 y.o.   MRN: [id number]  HPI Here for annual medicare wellness visit and also chronic/acute medical problems  I have personally reviewed the Medicare Annual Wellness questionnaire and have noted 1. The patient's medical and social history 2. Their use of alcohol, tobacco or illicit drugs 3. Their current medications and supplements 4. The patient's functional ability including ADL's, fall risks, home safety risks and hearing or visual             impairment. 5. Diet and physical activities 6. Evidence for depression or mood disorders  The patients weight, height, BMI have been recorded in the chart and visual acuity is per eye clinic.  I have made referrals, counseling and provided education to the patient based review of the above and I have provided the pt with a written personalized care plan for preventive services.  Doing great - taking care of herself  She had a uti several weeks ago  Feeling better    See scanned forms.  Routine anticipatory guidance given to patient.  See health maintenance. Colon cancer screening 5/11 - gets them every 5 years  Breast cancer screening 12/15 nl   (sister had breast cancer) Self breast exam no lumps  Had a hysterectomy  Flu vaccine -10/15  Tetanus vaccine 10/11  Pneumovax 1/14, will get prevnar today  Zoster vaccine 11/15   Advance directive- she gave to me today- we have a copy  Cognitive function addressed- see scanned forms- and if abnormal then additional documentation follows. -no major concerns   PMH and SH reviewed  Meds, vitals, and allergies reviewed.   ROS: See HPI.  Otherwise negative.    D level is 56 Osteopenia dexa 9/12 - she does not want to get another one yet   Smoking status - no change in that  She keeps trying to cut back and put off cigarettes as long as she can  No sob or symptoms of copd   Lab Results  Component Value Date   WBC 8.1 11/12/2014   HGB 14.1 11/12/2014   HCT 42.2 11/12/2014   MCV 92.7 11/12/2014   PLT 277.0 11/12/2014      Chemistry      Component Value Date/Time   NA 137 11/12/2014 0946   K 4.9 11/12/2014 0946   CL 102 11/12/2014 0946   CO2 28 11/12/2014 0946   BUN 18 11/12/2014 0946   CREATININE 0.87 11/12/2014 0946      Component Value Date/Time   CALCIUM 9.6 11/12/2014 0946   ALKPHOS 90 11/12/2014 0946   AST 18 11/12/2014 0946   ALT 11 11/12/2014 0946   BILITOT 0.4 11/12/2014 0946      Lab Results  Component Value Date   TSH 3.39 11/12/2014    Cholesterol Lab Results  Component Value Date   CHOL 171 11/12/2014   CHOL 216* 11/01/2013   CHOL 211* 10/26/2012   Lab Results  Component Value Date   HDL 64.90 11/12/2014   HDL 66.40 11/01/2013   HDL 66.90 10/26/2012   Lab Results  Component Value Date   LDLCALC 90 11/12/2014   LDLCALC 85 09/08/2011   LDLCALC 112* 07/21/2010   Lab Results  Component Value Date   TRIG 79.0 11/12/2014   TRIG 75.0 11/01/2013   TRIG 106.0 10/26/2012   Lab Results  Component Value Date   CHOLHDL 3 11/12/2014   CHOLHDL 3 11/01/2013  CHOLHDL 3 10/26/2012   Lab Results  Component Value Date   LDLDIRECT 138.2 11/01/2013   LDLDIRECT 109.6 10/26/2012   really good profile  Trying to eat better  She has cut back on sugar and ice cream   Patient Active Problem List   Diagnosis Date Noted  . Encounter for Medicare annual wellness exam 10/31/2013  . Headache 11/01/2012  . Gynecologic exam normal 09/15/2011  . Routine general medical examination at a health care facility 09/07/2011  . ONYCHOMYCOSIS 07/24/2010  . PURE HYPERCHOLESTEROLEMIA 07/21/2010  . COLONIC POLYPS, HYPERPLASTIC, HX OF 02/18/2010  . Nonspecific (abnormal) findings on radiological and other examination of body structure 08/02/2008  . [place] LUNG FIELD 08/02/2008  . Osteopenia 07/20/2007  . ANXIETY 02/11/2007  . TOBACCO ABUSE 02/11/2007   . DEPRESSION 02/11/2007  . GERD 02/11/2007  . INSOMNIA 02/11/2007  . FIBROCYSTIC BREAST DISEASE, HX OF 02/11/2007   Past Medical History  Diagnosis Date  . Anxiety   . Depression   . GERD (gastroesophageal reflux disease)   . Arthritis   . Tobacco abuse   . Insomnia   . Chronic headaches   . IBS (irritable bowel syndrome)   . Colon polyps    Past Surgical History  Procedure Laterality Date  . Abdominal hysterectomy      bleeding, fibroid, endometriosis (total)  . Laparoscopy  1984  . Ct of chest  10/09    bronchiectasis   History  Substance Use Topics  . Smoking status: Current Every Day Smoker -- 1.00 packs/day    Types: Cigarettes  . Smokeless tobacco: Never Used  . Alcohol Use: 0.0 oz/week    0 Not specified per week     Comment: occasional   Family History  Problem Relation Age of Onset  . Cancer Mother     pancreatic, liver  . Cancer Father     colon  . Thyroid disease Sister   . Colon polyps Sister   . Irritable bowel syndrome Sister   . Cancer Maternal Aunt     breast  . Cancer Paternal Grandmother     thyroid  . Miscarriages / [place] Cousin   . Cancer Cousin     breast  . Diabetes Cousin   . Thyroid disease Sister   . Heart disease Other    Allergies  Allergen Reactions  . Alendronate Sodium     REACTION: muscle, joint pain, indigestion  . Doxycycline     REACTION: ? rash or n/v  . Penicillins   . Varenicline Tartrate     REACTION: nausea   Current Outpatient Prescriptions on File Prior to Visit  Medication Sig Dispense Refill  . Calcium Citrate-Vitamin D (CITRACAL/VITAMIN D) 250-200 MG-UNIT TABS Take 4 tablets by mouth daily.     . cetirizine (ZYRTEC) 10 MG tablet Take 10 mg by mouth daily.      . ciprofloxacin (CIPRO) 500 MG tablet Take 1 tablet (500 mg total) by mouth 2 (two) times daily. 10 tablet 0  . cyclobenzaprine (FLEXERIL) 10 MG tablet Take 10 mg by mouth as needed.    Marland Kitchen escitalopram (LEXAPRO) 20 MG tablet TAKE 1 TABLET  DAILY 90 tablet 0  . fish oil-omega-3 fatty acids 1000 MG capsule Take 2 g by mouth daily.     . Lactobacillus (ACIDOPHILUS) 100 MG CAPS Take 1 capsule by mouth daily.      . Magnesium 250 MG TABS Take 1 tablet by mouth daily.    Marland Kitchen  Melatonin 1 MG TABS As directed at bedtime.     . montelukast (SINGULAIR) 10 MG tablet TAKE 1 TABLET AT BEDTIME 90 tablet 0  . Multiple Vitamin (MULTIVITAMIN) tablet Take 1 tablet by mouth daily.      Marland Kitchen omeprazole (PRILOSEC) 20 MG capsule TAKE 1 CAPSULE DAILY 90 capsule 0  . Phenylephrine-Bromphen-DM (PHENYLEPHRINE COMPLEX PO) Take 1 tablet up to every 4 hours as needed     . SUMAtriptan (IMITREX) 50 MG tablet Take 50 mg by mouth daily. May repeat in 2 hours if headache persists or recurs.     No current facility-administered medications on file prior to visit.         Review of Systems Review of Systems  Constitutional: Negative for fever, appetite change, fatigue and unexpected weight change.  Eyes: Negative for pain and visual disturbance.  Respiratory: Negative for cough and shortness of breath.   Cardiovascular: Negative for cp or palpitations    Gastrointestinal: Negative for nausea, diarrhea and constipation.  Genitourinary: Negative for urgency and frequency.  Skin: Negative for pallor or rash   Neurological: Negative for weakness, light-headedness, numbness and headaches.  Hematological: Negative for adenopathy. Does not bruise/bleed easily.  Psychiatric/Behavioral: Negative for dysphoric mood. The patient is not nervous/anxious.         Objective:   Physical Exam  Constitutional: She appears well-developed and well-nourished. No distress.  HENT:  Head: Normocephalic and atraumatic.  Right Ear: External ear normal.  Left Ear: External ear normal.  Mouth/Throat: Oropharynx is clear and moist.  Eyes: Conjunctivae and EOM are normal. Pupils are equal, round, and reactive to light. No scleral icterus.  Neck: Normal range of motion. Neck  supple. No JVD present. Carotid bruit is not present. No thyromegaly present.  Cardiovascular: Normal rate, regular rhythm, normal heart sounds and intact distal pulses.  Exam reveals no gallop.   Pulmonary/Chest: Effort normal and breath sounds normal. No respiratory distress. She has no wheezes. She exhibits no tenderness.  Diffusely distant bs   Abdominal: Soft. Bowel sounds are normal. She exhibits no distension, no abdominal bruit and no mass. There is no tenderness.  Genitourinary: No breast swelling, tenderness, discharge or bleeding.  Breast exam: No mass, nodules, thickening, tenderness, bulging, retraction, inflamation, nipple discharge or skin changes noted.  No axillary or clavicular LA.      Musculoskeletal: Normal range of motion. She exhibits no edema or tenderness.  No kyphosis   Lymphadenopathy:    She has no cervical adenopathy.  Neurological: She is alert. She has normal reflexes. No cranial nerve deficit. She exhibits normal muscle tone. Coordination normal.  Skin: Skin is warm and dry. No rash noted. No erythema. No pallor.  Psychiatric: She has a normal mood and affect.          Assessment & Plan:   Problem List Items Addressed This Visit      Musculoskeletal and Integument   Osteopenia    Declines further dexa tests at this time  Disc need for calcium/ vitamin D/ wt bearing exercise and bone density test every 2 y to monitor Disc safety/ fracture risk in detail   Understands that smoking further inc fracture risk         Other   Encounter for Medicare annual wellness exam    Reviewed health habits including diet and exercise and skin cancer prevention Reviewed appropriate screening tests for age  Also reviewed health mt list, fam hx and immunization status , as well as social and  family history   See HPI Labs reviewed  prevnar vaccine       PURE HYPERCHOLESTEROLEMIA    Disc goals for lipids and reasons to control them Rev labs with pt Rev low sat  fat diet in detail Improved with recently better diet       TOBACCO ABUSE    Disc in detail risks of smoking and possible outcomes including copd, vascular/ heart disease, cancer , respiratory and sinus infections also osteoporosis and fractures  Pt voices understanding  She is not ready to quit but thinking about it  Offered help when ready       Other Visit Diagnoses    Need for pneumococcal vaccination    -  Primary    Relevant Orders    Pneumococcal conjugate vaccine 13-valent IM (Completed)

## 2014-11-19 NOTE — Assessment & Plan Note (Signed)
Reviewed health habits including diet and exercise and skin cancer prevention Reviewed appropriate screening tests for age  Also reviewed health mt list, fam hx and immunization status , as well as social and family history   See HPI Labs reviewed  prevnar vaccine

## 2014-11-19 NOTE — Assessment & Plan Note (Signed)
Disc in detail risks of smoking and possible outcomes including copd, vascular/ heart disease, cancer , respiratory and sinus infections also osteoporosis and fractures  Pt voices understanding  She is not ready to quit but thinking about it  Offered help when ready

## 2014-11-19 NOTE — Patient Instructions (Signed)
If you have a 5 year colonoscopy recall you will be due in May  Call use in April for a referral if you do not get a reminder from GI Keep thinking about quitting smoking  If you decide you want a bone density test- let us know

## 2014-11-19 NOTE — Assessment & Plan Note (Signed)
Declines further dexa tests at this time  Disc need for calcium/ vitamin D/ wt bearing exercise and bone density test every 2 y to monitor Disc safety/ fracture risk in detail   Understands that smoking further inc fracture risk

## 2014-11-19 NOTE — Progress Notes (Signed)
Pre visit review using our clinic review tool, if applicable. No additional management support is needed unless otherwise documented below in the visit note. 

## 2014-11-19 NOTE — Assessment & Plan Note (Signed)
Disc goals for lipids and reasons to control them Rev labs with pt Rev low sat fat diet in detail Improved with recently better diet

## 2015-09-03 ENCOUNTER — Other Ambulatory Visit: Payer: Self-pay

## 2015-09-03 DIAGNOSIS — Z1231 Encounter for screening mammogram for malignant neoplasm of breast: Secondary | ICD-10-CM

## 2015-10-09 ENCOUNTER — Ambulatory Visit
Admission: RE | Admit: 2015-10-09 | Discharge: 2015-10-09 | Disposition: A | Discharge status: Home / Self Care | Payer: Medicare Other | Source: Ambulatory Visit

## 2015-10-09 DIAGNOSIS — Z1231 Encounter for screening mammogram for malignant neoplasm of breast: Secondary | ICD-10-CM | POA: Diagnosis not present

## 2015-10-10 LAB — HM MAMMOGRAPHY: HM Mammogram: NORMAL

## 2015-10-11 ENCOUNTER — Encounter: Payer: Self-pay | Admitting: *Deleted

## 2015-11-06 ENCOUNTER — Encounter: Payer: Self-pay | Admitting: Internal Medicine

## 2015-11-17 ENCOUNTER — Telehealth: Payer: Self-pay | Admitting: Family Medicine

## 2015-11-17 DIAGNOSIS — Z0000 Encounter for general adult medical examination without abnormal findings: Secondary | ICD-10-CM

## 2015-11-17 DIAGNOSIS — E7800 Pure hypercholesterolemia, unspecified: Secondary | ICD-10-CM

## 2015-11-17 DIAGNOSIS — K219 Gastro-esophageal reflux disease without esophagitis: Secondary | ICD-10-CM

## 2015-11-17 NOTE — Telephone Encounter (Signed)
-----   Message from Marchia Bond sent at 11/13/2015  4:14 PM EST ----- Regarding: Cpx labs Wed 2/8, need orders. Thanks! :-) Please order  future cpx labs for pt's upcoming lab appt. Thanks Aniceto Boss

## 2015-11-18 ENCOUNTER — Other Ambulatory Visit: Payer: Self-pay | Admitting: Family Medicine

## 2015-11-20 ENCOUNTER — Other Ambulatory Visit (INDEPENDENT_AMBULATORY_CARE_PROVIDER_SITE_OTHER): Payer: Medicare Other

## 2015-11-20 DIAGNOSIS — E7800 Pure hypercholesterolemia, unspecified: Secondary | ICD-10-CM

## 2015-11-20 DIAGNOSIS — K219 Gastro-esophageal reflux disease without esophagitis: Secondary | ICD-10-CM

## 2015-11-20 LAB — CBC WITH DIFFERENTIAL/PLATELET
BASOS ABS: 0 10*3/uL (ref 0.0–0.1)
BASOS PCT: 0.8 % (ref 0.0–3.0)
EOS ABS: 0.3 10*3/uL (ref 0.0–0.7)
Eosinophils Relative: 5.1 % — ABNORMAL HIGH (ref 0.0–5.0)
HCT: 44.8 % (ref 36.0–46.0)
HEMOGLOBIN: 14.8 g/dL (ref 12.0–15.0)
LYMPHS PCT: 29.8 % (ref 12.0–46.0)
Lymphs Abs: 1.7 10*3/uL (ref 0.7–4.0)
MCHC: 33.1 g/dL (ref 30.0–36.0)
MCV: 94 fl (ref 78.0–100.0)
Monocytes Absolute: 0.8 10*3/uL (ref 0.1–1.0)
Monocytes Relative: 13.8 % — ABNORMAL HIGH (ref 3.0–12.0)
Neutro Abs: 2.8 10*3/uL (ref 1.4–7.7)
Neutrophils Relative %: 50.5 % (ref 43.0–77.0)
Platelets: 232 10*3/uL (ref 150.0–400.0)
RBC: 4.77 Mil/uL (ref 3.87–5.11)
RDW: 13.8 % (ref 11.5–15.5)
WBC: 5.6 10*3/uL (ref 4.0–10.5)

## 2015-11-20 LAB — COMPREHENSIVE METABOLIC PANEL
ALBUMIN: 4.1 g/dL (ref 3.5–5.2)
ALK PHOS: 70 U/L (ref 39–117)
ALT: 15 U/L (ref 0–35)
AST: 24 U/L (ref 0–37)
BUN: 19 mg/dL (ref 6–23)
CALCIUM: 9.6 mg/dL (ref 8.4–10.5)
CO2: 30 mEq/L (ref 19–32)
Chloride: 102 mEq/L (ref 96–112)
Creatinine, Ser: 0.99 mg/dL (ref 0.40–1.20)
GFR: 59.24 mL/min — ABNORMAL LOW (ref >60.00)
Glucose, Bld: 108 mg/dL — ABNORMAL HIGH (ref 70–99)
Potassium: 4.7 mEq/L (ref 3.5–5.1)
Sodium: 137 mEq/L (ref 135–145)
Total Bilirubin: 0.2 mg/dL (ref 0.2–1.2)
Total Protein: 6.8 g/dL (ref 6.0–8.3)

## 2015-11-20 LAB — LIPID PANEL
CHOLESTEROL: 200 mg/dL (ref 0–200)
HDL: 82.1 mg/dL (ref >39.00)
LDL CALC: 107 mg/dL — AB (ref 0–99)
NonHDL: 118.19
TRIGLYCERIDES: 58 mg/dL (ref 0.0–149.0)
Total CHOL/HDL Ratio: 2
VLDL: 11.6 mg/dL (ref 0.0–40.0)

## 2015-11-20 LAB — TSH: TSH: 2.97 u[IU]/mL (ref 0.35–4.50)

## 2015-11-22 ENCOUNTER — Encounter: Payer: Self-pay | Admitting: Gastroenterology

## 2015-11-22 ENCOUNTER — Encounter: Payer: Self-pay | Admitting: Family Medicine

## 2015-11-22 ENCOUNTER — Ambulatory Visit (INDEPENDENT_AMBULATORY_CARE_PROVIDER_SITE_OTHER): Payer: Medicare Other | Admitting: Family Medicine

## 2015-11-22 VITALS — BP 112/70 | HR 82 | Temp 97.8°F | Ht 63.0 in | Wt 133.0 lb

## 2015-11-22 DIAGNOSIS — F17200 Nicotine dependence, unspecified, uncomplicated: Secondary | ICD-10-CM

## 2015-11-22 DIAGNOSIS — Z800 Family history of malignant neoplasm of digestive organs: Secondary | ICD-10-CM | POA: Insufficient documentation

## 2015-11-22 DIAGNOSIS — Z0000 Encounter for general adult medical examination without abnormal findings: Secondary | ICD-10-CM

## 2015-11-22 DIAGNOSIS — M8580 Other specified disorders of bone density and structure, unspecified site: Secondary | ICD-10-CM | POA: Diagnosis not present

## 2015-11-22 DIAGNOSIS — Z1211 Encounter for screening for malignant neoplasm of colon: Secondary | ICD-10-CM | POA: Insufficient documentation

## 2015-11-22 DIAGNOSIS — E7800 Pure hypercholesterolemia, unspecified: Secondary | ICD-10-CM | POA: Diagnosis not present

## 2015-11-22 MED ORDER — MONTELUKAST SODIUM 10 MG PO TABS: 10.0000 mg | ORAL_TABLET | Freq: Every day | ORAL | Status: DC

## 2015-11-22 MED ORDER — ESCITALOPRAM OXALATE 20 MG PO TABS: 20.0000 mg | ORAL_TABLET | Freq: Every day | ORAL | Status: DC

## 2015-11-22 NOTE — Patient Instructions (Signed)
Stop at check out for referral for colonoscopy  Please think about quitting smoking  Take care of yourself  Labs are stable and cholesterol is good

## 2015-11-22 NOTE — Progress Notes (Signed)
Pre visit review using our clinic review tool, if applicable. No additional management support is needed unless otherwise documented below in the visit note. 

## 2015-11-22 NOTE — Progress Notes (Signed)
Subjective:    Patient ID: Cheryl Aguirre, female    DOB: [birth year], 70 y.o.   MRN: [id number]  HPI Here for annual medicare wellness visit as well as chronic/acute medical problems   I have personally reviewed the Medicare Annual Wellness questionnaire and have noted 1. The patient's medical and social history 2. Their use of alcohol, tobacco or illicit drugs 3. Their current medications and supplements 4. The patient's functional ability including ADL's, fall risks, home safety risks and hearing or visual             impairment. 5. Diet and physical activities 6. Evidence for depression or mood disorders  The patients weight, height, BMI have been recorded in the chart and visual acuity is per eye clinic.  I have made referrals, counseling and provided education to the patient based review of the above and I have provided the pt with a written personalized care plan for preventive services. Reviewed and updated provider list, see scanned forms.  See scanned forms.  Routine anticipatory guidance given to patient.  See health maintenance. Colon cancer screening 5/11 - polyp - was 5 year recall (family hx - father had colon cancer at 50)  Breast cancer screening mm 12/16 nl  Self breast exam-no lumps  Flu vaccine had in the fall at [facility]  Tetanus vaccine 10/11 Pneumovax complete on both  Zoster vaccine 11/15 dexa 9/12 -osteopenia - did not want another dexa  No falls or fractures  Takes ca and D ,   Vit D level is good 56.8  Advance directive has a living will and power of attorney  Cognitive function addressed- see scanned forms- and if abnormal then additional documentation follows. Pretty good memory overall / thinks she is doing well   PMH and SH reviewed  Meds, vitals, and allergies reviewed.   ROS: See HPI.  Otherwise negative.    Smoking status -same 1 ppd/ no plans to quit  No breathing problems    Wt is down 4 lb with bmi of 23  Eating healthy and eating  enough  Eats fish  Stays active-no regular exercise program    Cholesterol Lab Results  Component Value Date   CHOL 200 11/20/2015   CHOL 171 11/12/2014   CHOL 216* 11/01/2013   Lab Results  Component Value Date   HDL 82.10 11/20/2015   HDL 64.90 11/12/2014   HDL 66.40 11/01/2013   Lab Results  Component Value Date   LDLCALC 107* 11/20/2015   LDLCALC 90 11/12/2014   LDLCALC 85 09/08/2011   Lab Results  Component Value Date   TRIG 58.0 11/20/2015   TRIG 79.0 11/12/2014   TRIG 75.0 11/01/2013   Lab Results  Component Value Date   CHOLHDL 2 11/20/2015   CHOLHDL 3 11/12/2014   CHOLHDL 3 11/01/2013   Lab Results  Component Value Date   LDLDIRECT 138.2 11/01/2013   LDLDIRECT 109.6 10/26/2012   overall very good profile  Also takes krill oil and eats a very healthy diet     Results for orders placed or performed in visit on 11/20/15  CBC with Differential/Platelet  Result Value Ref Range   WBC 5.6 4.0 - 10.5 K/uL   RBC 4.77 3.87 - 5.11 Mil/uL   Hemoglobin 14.8 12.0 - 15.0 g/dL   HCT 44.8 36.0 - 46.0 %   MCV 94.0 78.0 - 100.0 fl   MCHC 33.1 30.0 - 36.0 g/dL   RDW 13.8 11.5 - 15.5 %  Platelets 232.0 150.0 - 400.0 K/uL   Neutrophils Relative % 50.5 43.0 - 77.0 %   Lymphocytes Relative 29.8 12.0 - 46.0 %   Monocytes Relative 13.8 (H) 3.0 - 12.0 %   Eosinophils Relative 5.1 (H) 0.0 - 5.0 %   Basophils Relative 0.8 0.0 - 3.0 %   Neutro Abs 2.8 1.4 - 7.7 K/uL   Lymphs Abs 1.7 0.7 - 4.0 K/uL   Monocytes Absolute 0.8 0.1 - 1.0 K/uL   Eosinophils Absolute 0.3 0.0 - 0.7 K/uL   Basophils Absolute 0.0 0.0 - 0.1 K/uL  Comprehensive metabolic panel  Result Value Ref Range   Sodium 137 135 - 145 mEq/L   Potassium 4.7 3.5 - 5.1 mEq/L   Chloride 102 96 - 112 mEq/L   CO2 30 19 - 32 mEq/L   Glucose, Bld 108 (H) 70 - 99 mg/dL   BUN 19 6 - 23 mg/dL   Creatinine, Ser 0.99 0.40 - 1.20 mg/dL   Total Bilirubin 0.2 0.2 - 1.2 mg/dL   Alkaline Phosphatase 70 39 - 117 U/L    AST 24 0 - 37 U/L   ALT 15 0 - 35 U/L   Total Protein 6.8 6.0 - 8.3 g/dL   Albumin 4.1 3.5 - 5.2 g/dL   Calcium 9.6 8.4 - 10.5 mg/dL   GFR 59.24 (L) >60.00 mL/min  Lipid panel  Result Value Ref Range   Cholesterol 200 0 - 200 mg/dL   Triglycerides 58.0 0.0 - 149.0 mg/dL   HDL 82.10 >39.00 mg/dL   VLDL 11.6 0.0 - 40.0 mg/dL   LDL Cholesterol 107 (H) 0 - 99 mg/dL   Total CHOL/HDL Ratio 2    NonHDL 118.19   TSH  Result Value Ref Range   TSH 2.97 0.35 - 4.50 uIU/mL    She was not fasting for the draw Had eaten ice cream  Glucose   Patient Active Problem List   Diagnosis Date Noted  . Encounter for Medicare annual wellness exam 10/31/2013  . Headache(784.0) 11/01/2012  . Gynecologic exam normal 09/15/2011  . Routine general medical examination at a health care facility 09/07/2011  . ONYCHOMYCOSIS 07/24/2010  . PURE HYPERCHOLESTEROLEMIA 07/21/2010  . COLONIC POLYPS, HYPERPLASTIC, HX OF 02/18/2010  . Nonspecific (abnormal) findings on radiological and other examination of body structure 08/02/2008  . [place] LUNG FIELD 08/02/2008  . Osteopenia 07/20/2007  . ANXIETY 02/11/2007  . TOBACCO ABUSE 02/11/2007  . DEPRESSION 02/11/2007  . GERD 02/11/2007  . INSOMNIA 02/11/2007  . FIBROCYSTIC BREAST DISEASE, HX OF 02/11/2007   Past Medical History  Diagnosis Date  . Anxiety   . Depression   . GERD (gastroesophageal reflux disease)   . Arthritis   . Tobacco abuse   . Insomnia   . Chronic headaches   . IBS (irritable bowel syndrome)   . Colon polyps    Past Surgical History  Procedure Laterality Date  . Abdominal hysterectomy      bleeding, fibroid, endometriosis (total)  . Laparoscopy  1984  . Ct of chest  10/09    bronchiectasis   Social History  Substance Use Topics  . Smoking status: Current Every Day Smoker -- 1.00 packs/day    Types: Cigarettes  . Smokeless tobacco: Never Used  . Alcohol Use: 0.0 oz/week    0 Standard drinks or  equivalent per week     Comment: occasional   Family History  Problem Relation Age of Onset  . Cancer  Mother     pancreatic, liver  . Cancer Father     colon  . Thyroid disease Sister   . Colon polyps Sister   . Irritable bowel syndrome Sister   . Cancer Maternal Aunt     breast  . Cancer Paternal Grandmother     thyroid  . Miscarriages / [place] Cousin   . Cancer Cousin     breast  . Diabetes Cousin   . Thyroid disease Sister   . Heart disease Other    Allergies  Allergen Reactions  . Alendronate Sodium     REACTION: muscle, joint pain, indigestion  . Doxycycline     REACTION: ? rash or n/v  . Penicillins   . Varenicline Tartrate     REACTION: nausea   Current Outpatient Prescriptions on File Prior to Visit  Medication Sig Dispense Refill  . Calcium Citrate-Vitamin D (CITRACAL/VITAMIN D) 250-200 MG-UNIT TABS Take 4 tablets by mouth daily.     . cetirizine (ZYRTEC) 10 MG tablet Take 10 mg by mouth daily.      . cyclobenzaprine (FLEXERIL) 10 MG tablet Take 10 mg by mouth as needed.    Marland Kitchen escitalopram (LEXAPRO) 20 MG tablet TAKE 1 TABLET DAILY 90 tablet 0  . fish oil-omega-3 fatty acids 1000 MG capsule Take 2 g by mouth daily.     . Lactobacillus (ACIDOPHILUS) 100 MG CAPS Take 1 capsule by mouth daily.      . Magnesium 250 MG TABS Take 1 tablet by mouth daily.    . Melatonin 1 MG TABS As directed at bedtime.     . montelukast (SINGULAIR) 10 MG tablet TAKE 1 TABLET AT BEDTIME 90 tablet 0  . Multiple Vitamin (MULTIVITAMIN) tablet Take 1 tablet by mouth daily.      Marland Kitchen Phenylephrine-Bromphen-DM (PHENYLEPHRINE COMPLEX PO) Take 1 tablet up to every 4 hours as needed     . SUMAtriptan (IMITREX) 50 MG tablet Take 50 mg by mouth daily. May repeat in 2 hours if headache persists or recurs.     No current facility-administered medications on file prior to visit.    Review of Systems Review of Systems  Constitutional: Negative for fever, appetite change, fatigue and unexpected  weight change.  Eyes: Negative for pain and visual disturbance.  Respiratory: Negative for cough and shortness of breath.   Cardiovascular: Negative for cp or palpitations    Gastrointestinal: Negative for nausea, diarrhea and constipation.  Genitourinary: Negative for urgency and frequency.  Skin: Negative for pallor or rash   Neurological: Negative for weakness, light-headedness, numbness and headaches.  Hematological: Negative for adenopathy. Does not bruise/bleed easily.  Psychiatric/Behavioral: Negative for dysphoric mood. The patient is not nervous/anxious.         Objective:   Physical Exam  Constitutional: She appears well-developed and well-nourished. No distress.  Well appearing   HENT:  Head: Normocephalic and atraumatic.  Right Ear: External ear normal.  Left Ear: External ear normal.  Mouth/Throat: Oropharynx is clear and moist.  Eyes: Conjunctivae and EOM are normal. Pupils are equal, round, and reactive to light. No scleral icterus.  Neck: Normal range of motion. Neck supple. No JVD present. Carotid bruit is not present. No thyromegaly present.  Cardiovascular: Normal rate, regular rhythm, normal heart sounds and intact distal pulses.  Exam reveals no gallop.   Pulmonary/Chest: Effort normal and breath sounds normal. No respiratory distress. She has no wheezes. She exhibits no tenderness.  bs are clear but mildly distant  Abdominal: Soft.  Bowel sounds are normal. She exhibits no distension, no abdominal bruit and no mass. There is no tenderness.  Genitourinary: No breast swelling, tenderness, discharge or bleeding.  Breast exam: No mass, nodules, thickening, tenderness, bulging, retraction, inflamation, nipple discharge or skin changes noted.  No axillary or clavicular LA.      Musculoskeletal: Normal range of motion. She exhibits no edema or tenderness.  No kyphosis   Lymphadenopathy:    She has no cervical adenopathy.  Neurological: She is alert. She has normal  reflexes. No cranial nerve deficit. She exhibits normal muscle tone. Coordination normal.  Skin: Skin is warm and dry. No rash noted. No erythema. No pallor.  Psychiatric: She has a normal mood and affect.          Assessment & Plan:   Problem List Items Addressed This Visit      Musculoskeletal and Integument   Osteopenia    Pt declines another dexa or any treatment at this time  No falls or fractures Stop at check out for referral for colonoscopy  Please think about quitting smoking  Take care of yourself  Labs are stable and cholesterol is good         Other   Colon cancer screening    Ref for 5 y recall      Relevant Orders   Ambulatory referral to Gastroenterology   Encounter for Medicare annual wellness exam - Primary    Reviewed health habits including diet and exercise and skin cancer prevention Reviewed appropriate screening tests for age  Also reviewed health mt list, fam hx and immunization status , as well as social and family history   See HPI Labs reviewed Stop at check out for referral for colonoscopy  Please think about quitting smoking  Take care of yourself  Labs are stable and cholesterol is good       Family history of colon cancer    Father had colon cancer  Pt is due for her 5 year colonoscopy Referred for that       Relevant Orders   Ambulatory referral to Gastroenterology   PURE HYPERCHOLESTEROLEMIA    Disc goals for lipids and reasons to control them Rev labs with pt Rev low sat fat diet in detail Overall good control      TOBACCO ABUSE    Disc in detail risks of smoking and possible outcomes including copd, vascular/ heart disease, cancer , respiratory and sinus infections  Pt voices understanding  She does not desire to quit at this time

## 2015-11-24 NOTE — Assessment & Plan Note (Addendum)
Disc in detail risks of smoking and possible outcomes including copd, vascular/ heart disease, cancer , respiratory and sinus infections  Pt voices understanding  She does not desire to quit at this time  Given info on lung cancer screening program- she does not think she would be interested

## 2015-11-24 NOTE — Assessment & Plan Note (Signed)
Ref for 5 y recall

## 2015-11-24 NOTE — Assessment & Plan Note (Signed)
Reviewed health habits including diet and exercise and skin cancer prevention Reviewed appropriate screening tests for age  Also reviewed health mt list, fam hx and immunization status , as well as social and family history   See HPI Labs reviewed Stop at check out for referral for colonoscopy  Please think about quitting smoking  Take care of yourself  Labs are stable and cholesterol is good

## 2015-11-24 NOTE — Assessment & Plan Note (Signed)
Pt declines another dexa or any treatment at this time  No falls or fractures Stop at check out for referral for colonoscopy  Please think about quitting smoking  Take care of yourself  Labs are stable and cholesterol is good

## 2015-11-24 NOTE — Assessment & Plan Note (Signed)
Father had colon cancer  Pt is due for her 6 year colonoscopy Referred for that

## 2015-11-24 NOTE — Assessment & Plan Note (Signed)
Disc goals for lipids and reasons to control them Rev labs with pt Rev low sat fat diet in detail Overall good control  

## 2015-11-30 ENCOUNTER — Other Ambulatory Visit: Payer: Self-pay | Admitting: Family Medicine

## 2015-12-02 NOTE — Telephone Encounter (Signed)
Patient returned Shapale's call.  Patient said she's not taking the medication anymore.

## 2015-12-02 NOTE — Telephone Encounter (Signed)
Left voicemail requesting pt to call office back, I'm not sure if pt's on this med or not. At her CPE on 11/22/15 it was noted pt was done with med so this could be a auto refill request or pt may have needed to restart med, will await call back before filling Rx

## 2016-01-31 ENCOUNTER — Encounter: Payer: Self-pay | Admitting: Gastroenterology

## 2016-01-31 ENCOUNTER — Ambulatory Visit (INDEPENDENT_AMBULATORY_CARE_PROVIDER_SITE_OTHER): Payer: Medicare Other | Admitting: Gastroenterology

## 2016-01-31 VITALS — BP 130/70 | Ht 63.0 in | Wt 133.0 lb

## 2016-01-31 DIAGNOSIS — Z800 Family history of malignant neoplasm of digestive organs: Secondary | ICD-10-CM

## 2016-01-31 DIAGNOSIS — N393 Stress incontinence (female) (male): Secondary | ICD-10-CM

## 2016-01-31 MED ORDER — NA SULFATE-K SULFATE-MG SULF 17.5-3.13-1.6 GM/177ML PO SOLN: 1.0000 | Freq: Once | ORAL | Status: DC

## 2016-01-31 NOTE — Patient Instructions (Addendum)
You have been scheduled for a colonoscopy. Please follow written instructions given to you at your visit today.  Please pick up your prep supplies at the pharmacy within the next 1-3 days. If you use inhalers (even only as needed), please bring them with you on the day of your procedure. Your physician has requested that you go to www.startemmi.com and enter the access code given to you at your visit today. This web site gives a general overview about your procedure. However, you should still follow specific instructions given to you by our office regarding your preparation for the procedure.  We have scheduled an appointment at [facility] on 03/13/2016 at 1:30pm with Dr McDiarmid

## 2016-01-31 NOTE — Progress Notes (Signed)
Cheryl Aguirre    [id number]    January 13, 1946  Primary Care Physician:Marne Tower, MD  Referring Physician: Abner Greenspan, MD [address] [place], [place] [address]  Chief complaint:  Family history of colon cancer  HPI: 70 year old female previously followed by Dr. Olevia Perches is here to establish care with me. She has a family history of colon cancer in her father at the age of 80 and and also her grandmother . Her last colonoscopy in 2011 1 sessile hyperplastic polyp removed and prior to that colonoscopy in 2006 also had removal of a hyperplastic polyp. Denies any nausea, vomiting, abdominal pain, melena or bright red blood per rectum she has occasional constipation or increased bowel frequency but in general has regular daily bowel movements.On review of system positive for urinary incontinence when she coughs. Denies any fecal incontinence. She is status post hysterectomy     Outpatient Encounter Prescriptions as of 01/31/2016  Medication Sig  . Calcium Citrate-Vitamin D (CITRACAL/VITAMIN D) 250-200 MG-UNIT TABS Take 4 tablets by mouth daily.   . cetirizine (ZYRTEC) 10 MG tablet Take 10 mg by mouth daily.    . cyclobenzaprine (FLEXERIL) 10 MG tablet Take 10 mg by mouth as needed.  Marland Kitchen escitalopram (LEXAPRO) 20 MG tablet Take 1 tablet (20 mg total) by mouth daily.  . fish oil-omega-3 fatty acids 1000 MG capsule Take 2 g by mouth daily.   . Lactobacillus (ACIDOPHILUS) 100 MG CAPS Take 1 capsule by mouth daily.    . Magnesium 250 MG TABS Take 1 tablet by mouth daily.  . Melatonin 1 MG TABS As directed at bedtime.   . montelukast (SINGULAIR) 10 MG tablet Take 1 tablet (10 mg total) by mouth at bedtime.  . Multiple Vitamin (MULTIVITAMIN) tablet Take 1 tablet by mouth daily.    Marland Kitchen Phenylephrine-Bromphen-DM (PHENYLEPHRINE COMPLEX PO) Take 1 tablet up to every 4 hours as needed   . SUMAtriptan (IMITREX) 50 MG tablet Take 50 mg by mouth daily. May repeat  in 2 hours if headache persists or recurs.  . Na Sulfate-K Sulfate-Mg Sulf (SUPREP BOWEL PREP) SOLN Take 1 kit by mouth once.   No facility-administered encounter medications on file as of 01/31/2016.    Allergies as of 01/31/2016 - Review Complete 01/31/2016  Allergen Reaction Noted  . Alendronate sodium    . Doxycycline  02/11/2007  . Penicillins  02/11/2007  . Varenicline tartrate  02/11/2007    Past Medical History  Diagnosis Date  . Anxiety   . Depression   . GERD (gastroesophageal reflux disease)   . Arthritis   . Tobacco abuse   . Insomnia   . Chronic headaches   . IBS (irritable bowel syndrome)   . Colon polyps     Past Surgical History  Procedure Laterality Date  . Abdominal hysterectomy      bleeding, fibroid, endometriosis (total)  . Laparoscopy  1984  . Ct of chest  10/09    bronchiectasis    Family History  Problem Relation Age of Onset  . Cancer Mother     pancreatic, liver  . Cancer Father     colon  . Thyroid disease Sister   . Colon polyps Sister   . Irritable bowel syndrome Sister   . Cancer Maternal Aunt     breast  . Cancer Paternal Grandmother     thyroid  . Miscarriages / [place] Cousin   .  Cancer Cousin     breast  . Diabetes Cousin   . Thyroid disease Sister   . Heart disease Other     Social History   Social History  . Marital Status: Married    Spouse Name: N/A  . Number of Children: 1  . Years of Education: N/A   Occupational History  . Unemployed    Social History Main Topics  . Smoking status: Current Every Day Smoker -- 1.00 packs/day    Types: Cigarettes  . Smokeless tobacco: Never Used  . Alcohol Use: 0.0 oz/week    0 Standard drinks or equivalent per week     Comment: occasional  . Drug Use: No  . Sexual Activity: Not on file   Other Topics Concern  . Not on file   Social History Narrative   Daily Caffeine Use:  2 daily      Review of systems: Review of Systems  Constitutional: Negative for  fever and chills.  HENT: Negative.   Eyes: Negative for blurred vision.  Respiratory: Negative for cough, shortness of breath and wheezing.   Cardiovascular: Negative for chest pain and palpitations.  Gastrointestinal: as per HPI Genitourinary: Negative for dysuria, urgency, frequency and hematuria.  Musculoskeletal: Negative for myalgias, back pain and joint pain.  Skin: Negative for itching and rash.  Neurological: Negative for dizziness, tremors, focal weakness, seizures and loss of consciousness.  Endo/Heme/Allergies: Negative for environmental allergies.  Psychiatric/Behavioral: Negative for depression, suicidal ideas and hallucinations.  All other systems reviewed and are negative.   Physical Exam: Filed Vitals:   01/31/16 1341  BP: 130/70   Gen:      No acute distress HEENT:  EOMI, sclera anicteric Neck:     No masses; no thyromegaly Lungs:    Clear to auscultation bilaterally; normal respiratory effort CV:         Regular rate and rhythm; no murmurs Abd:      + bowel sounds; soft, non-tender; no palpable masses, no distension Ext:    No edema; adequate peripheral perfusion Skin:      Warm and dry; no rash Neuro: alert and oriented x 3 Psych: normal mood and affect  Data Reviewed: Reviewed chart in epic   Assessment and Plan/Recommendations: 70 year old female with positive family history of colon cancer in her father at age 6 is here for follow-up visit.  She is past due for screening colonoscopy, we'll schedule it We'll send referral to urology for evaluation and management of urinary incontinence Return as needed  K. Denzil Magnuson , MD [phone] Mon-Fri 8a-5p [phone] after 5p, weekends, holidays

## 2016-02-18 ENCOUNTER — Encounter: Payer: Self-pay | Admitting: Gastroenterology

## 2016-02-18 ENCOUNTER — Ambulatory Visit (AMBULATORY_SURGERY_CENTER): Payer: Medicare Other | Admitting: Gastroenterology

## 2016-02-18 VITALS — BP 124/62 | HR 75 | Temp 98.0°F | Resp 15 | Ht 63.0 in | Wt 133.0 lb

## 2016-02-18 DIAGNOSIS — Z800 Family history of malignant neoplasm of digestive organs: Secondary | ICD-10-CM

## 2016-02-18 DIAGNOSIS — K219 Gastro-esophageal reflux disease without esophagitis: Secondary | ICD-10-CM | POA: Diagnosis not present

## 2016-02-18 DIAGNOSIS — Z1211 Encounter for screening for malignant neoplasm of colon: Secondary | ICD-10-CM | POA: Diagnosis not present

## 2016-02-18 DIAGNOSIS — K633 Ulcer of intestine: Secondary | ICD-10-CM

## 2016-02-18 DIAGNOSIS — K589 Irritable bowel syndrome without diarrhea: Secondary | ICD-10-CM | POA: Diagnosis not present

## 2016-02-18 MED ORDER — SODIUM CHLORIDE 0.9 % IV SOLN: 500.0000 mL | INTRAVENOUS | Status: DC

## 2016-02-18 NOTE — Op Note (Signed)
[name] Procedure Date: 02/18/2016 10:46 AM MRN: [id number] Endoscopist: Mauri Pole , MD Age: 70 Date of Birth: 09-05-1946 Gender: Female Procedure:                Colonoscopy Indications:              Screening in patient at increased risk: Family                            history of 1st-degree relative with colorectal                            cancer before age 66 years Medicines:                Monitored Anesthesia Care Procedure:                Pre-Anesthesia Assessment:                           - Prior to the procedure, a History and Physical                            was performed, and patient medications and                            allergies were reviewed. The patient's tolerance of                            previous anesthesia was also reviewed. The risks                            and benefits of the procedure and the sedation                            options and risks were discussed with the patient.                            All questions were answered, and informed consent                            was obtained. Prior Anticoagulants: The patient has                            taken no previous anticoagulant or antiplatelet                            agents. ASA Grade Assessment: II - A patient with                            mild systemic disease. After reviewing the risks                            and benefits, the patient was deemed in  satisfactory condition to undergo the procedure.                           After obtaining informed consent, the colonoscope                            was passed under direct vision. Throughout the                            procedure, the patient's blood pressure, pulse, and                            oxygen saturations were monitored continuously. The                            Model PCF-H190DL [id number]) scope was introduced            through the anus and advanced to the the terminal                            ileum, with identification of the appendiceal                            orifice and IC valve. The colonoscopy was performed                            without difficulty. The patient tolerated the                            procedure well. The quality of the bowel                            preparation was excellent. The terminal ileum,                            ileocecal valve, appendiceal orifice, and rectum                            were photographed. Scope In: 10:55:11 AM Scope Out: 11:24:31 AM Scope Withdrawal Time: 0 hours 15 minutes 45 seconds  Total Procedure Duration: 0 hours 29 minutes 20 seconds  Findings:                 The perianal and digital rectal examinations were                            normal.                           A single (solitary) twenty mm ulcer was found in                            the cecum with raised margins. No bleeding was                            present.  No stigmata of recent bleeding were seen.                            Biopsies were taken with a cold forceps for                            histology.                           A few small-mouthed diverticula were found in the                            sigmoid colon.                           Non-bleeding internal hemorrhoids were found during                            retroflexion. The hemorrhoids were small.                           The terminal ileum appeared normal.                           The exam was otherwise without abnormality. Complications:            No immediate complications. Estimated Blood Loss:     Estimated blood loss was minimal. Impression:               - A single (solitary) ulcer in the cecum. Biopsied.                           - The examined portion of the ileum was normal.                           - The examination was otherwise normal. Recommendation:           - Patient has  a contact number available for                            emergencies. The signs and symptoms of potential                            delayed complications were discussed with the                            patient. Return to normal activities tomorrow.                            Written discharge instructions were provided to the                            patient.                           - Resume previous diet.                           -  Continue present medications.                           - Await pathology results.                           - Repeat colonoscopy is recommended for                            surveillance. The colonoscopy date will be                            determined after pathology results from today's                            exam become available for review.                           - Return to GI clinic PRN. Mauri Pole, MD 02/18/2016 11:32:35 AM This report has been signed electronically.

## 2016-02-18 NOTE — Progress Notes (Signed)
Called to room to assist during endoscopic procedure.  Patient ID and intended procedure confirmed with present staff. Received instructions for my participation in the procedure from the performing physician.  

## 2016-02-18 NOTE — Progress Notes (Signed)
  [facility] Post-op Note  Patient: Cheryl Aguirre  Procedure(s) Performed: colonoscopy  Patient Location: [facility] - Recovery Area  Anesthesia Type: Deep Sedation/Propofol  Level of Consciousness: awake, oriented and patient cooperative  Airway and Oxygen Therapy: Patient Spontanous Breathing  Post-op Pain: none  Post-op Assessment:  Post-op Vital signs reviewed, Patient's Cardiovascular Status Stable, Respiratory Function Stable, Patent Airway, No signs of Nausea or vomiting and Pain level controlled  Post-op Vital Signs: Reviewed and stable  Complications: No apparent anesthesia complications  Tymon Nemetz E 11:40 AM

## 2016-02-18 NOTE — Patient Instructions (Signed)
YOU HAD AN ENDOSCOPIC PROCEDURE TODAY AT [facility]:   Refer to the procedure report that was given to you for any specific questions about what was found during the examination.  If the procedure report does not answer your questions, please call your gastroenterologist to clarify.  If you requested that your care partner not be given the details of your procedure findings, then the procedure report has been included in a sealed envelope for you to review at your convenience later.  YOU SHOULD EXPECT: Some feelings of bloating in the abdomen. Passage of more gas than usual.  Walking can help get rid of the air that was put into your GI tract during the procedure and reduce the bloating. If you had a lower endoscopy (such as a colonoscopy or flexible sigmoidoscopy) you may notice spotting of blood in your stool or on the toilet paper. If you underwent a bowel prep for your procedure, you may not have a normal bowel movement for a few days.  Please Note:  You might notice some irritation and congestion in your nose or some drainage.  This is from the oxygen used during your procedure.  There is no need for concern and it should clear up in a day or so.  SYMPTOMS TO REPORT IMMEDIATELY:   Following lower endoscopy (colonoscopy or flexible sigmoidoscopy):  Excessive amounts of blood in the stool  Significant tenderness or worsening of abdominal pains  Swelling of the abdomen that is new, acute  Fever of 100F or higher   For urgent or emergent issues, a gastroenterologist can be reached at any hour by calling [phone].   DIET: Your first meal following the procedure should be a small meal and then it is ok to progress to your normal diet. Heavy or fried foods are harder to digest and may make you feel nauseous or bloated.  Likewise, meals heavy in dairy and vegetables can increase bloating.  Drink plenty of fluids but you should avoid alcoholic beverages for 24  hours.  ACTIVITY:  You should plan to take it easy for the rest of today and you should NOT DRIVE or use heavy machinery until tomorrow (because of the sedation medicines used during the test).    FOLLOW UP: Our staff will call the number listed on your records the next business day following your procedure to check on you and address any questions or concerns that you may have regarding the information given to you following your procedure. If we do not reach you, we will leave a message.  However, if you are feeling well and you are not experiencing any problems, there is no need to return our call.  We will assume that you have returned to your regular daily activities without incident.  If any biopsies were taken you will be contacted by phone or by letter within the next 1-3 weeks.  Please call us at [phone] if you have not heard about the biopsies in 3 weeks.    SIGNATURES/CONFIDENTIALITY: You and/or your care partner have signed paperwork which will be entered into your electronic medical record.  These signatures attest to the fact that that the information above on your After Visit Summary has been reviewed and is understood.  Full responsibility of the confidentiality of this discharge information lies with you and/or your care-partner.  Wait biopsy results.  Hemorrhoids-handout given

## 2016-02-19 ENCOUNTER — Telehealth: Payer: Self-pay | Admitting: *Deleted

## 2016-02-19 NOTE — Telephone Encounter (Signed)
No answer, left message to call if questions or concerns. 

## 2016-02-25 ENCOUNTER — Encounter: Payer: Self-pay | Admitting: Gastroenterology

## 2016-03-13 DIAGNOSIS — N3946 Mixed incontinence: Secondary | ICD-10-CM | POA: Diagnosis not present

## 2016-03-13 DIAGNOSIS — R350 Frequency of micturition: Secondary | ICD-10-CM | POA: Diagnosis not present

## 2016-03-13 DIAGNOSIS — N3020 Other chronic cystitis without hematuria: Secondary | ICD-10-CM | POA: Diagnosis not present

## 2016-03-13 DIAGNOSIS — R351 Nocturia: Secondary | ICD-10-CM | POA: Diagnosis not present

## 2016-04-29 ENCOUNTER — Encounter: Payer: Self-pay | Admitting: Gastroenterology

## 2016-07-03 ENCOUNTER — Encounter: Payer: Self-pay | Admitting: Gastroenterology

## 2016-08-11 ENCOUNTER — Ambulatory Visit (AMBULATORY_SURGERY_CENTER): Payer: Self-pay

## 2016-08-11 VITALS — Ht 63.0 in | Wt 134.2 lb

## 2016-08-11 DIAGNOSIS — Z860100 Personal history of colon polyps, unspecified: Secondary | ICD-10-CM

## 2016-08-11 MED ORDER — SUPREP BOWEL PREP KIT 17.5-3.13-1.6 GM/177ML PO SOLN: 1.0000 | Freq: Once | ORAL | 0 refills | Status: AC

## 2016-08-11 NOTE — Progress Notes (Signed)
Tell md can not find diagnosis code for ULCER CECUM No allergies to eggs or soy No home oxygen No diet meds No past problems with anesthesis  Declined emmi

## 2016-08-25 ENCOUNTER — Encounter: Payer: Self-pay | Admitting: Gastroenterology

## 2016-08-25 ENCOUNTER — Telehealth: Payer: Self-pay | Admitting: Gastroenterology

## 2016-08-25 ENCOUNTER — Telehealth: Payer: Self-pay | Admitting: *Deleted

## 2016-08-25 ENCOUNTER — Ambulatory Visit (AMBULATORY_SURGERY_CENTER): Payer: Medicare Other | Admitting: Gastroenterology

## 2016-08-25 VITALS — BP 120/60 | HR 82 | Temp 98.4°F | Resp 26 | Ht 63.0 in | Wt 134.0 lb

## 2016-08-25 DIAGNOSIS — D122 Benign neoplasm of ascending colon: Secondary | ICD-10-CM | POA: Diagnosis not present

## 2016-08-25 DIAGNOSIS — Z1212 Encounter for screening for malignant neoplasm of rectum: Secondary | ICD-10-CM | POA: Diagnosis not present

## 2016-08-25 DIAGNOSIS — Z800 Family history of malignant neoplasm of digestive organs: Secondary | ICD-10-CM | POA: Diagnosis not present

## 2016-08-25 DIAGNOSIS — Z1211 Encounter for screening for malignant neoplasm of colon: Secondary | ICD-10-CM

## 2016-08-25 DIAGNOSIS — K633 Ulcer of intestine: Secondary | ICD-10-CM | POA: Diagnosis not present

## 2016-08-25 DIAGNOSIS — Z86010 Personal history of colonic polyps: Secondary | ICD-10-CM

## 2016-08-25 DIAGNOSIS — K639 Disease of intestine, unspecified: Secondary | ICD-10-CM | POA: Diagnosis not present

## 2016-08-25 DIAGNOSIS — K5730 Diverticulosis of large intestine without perforation or abscess without bleeding: Secondary | ICD-10-CM | POA: Diagnosis not present

## 2016-08-25 MED ORDER — SODIUM CHLORIDE 0.9 % IV SOLN: 500.0000 mL | INTRAVENOUS | Status: DC

## 2016-08-25 MED ORDER — HYOSCYAMINE SULFATE 0.125 MG SL SUBL: 0.1250 mg | SUBLINGUAL_TABLET | Freq: Three times a day (TID) | SUBLINGUAL | 1 refills | Status: DC

## 2016-08-25 NOTE — Progress Notes (Signed)
Called to room to assist during endoscopic procedure.  Patient ID and intended procedure confirmed with present staff. Received instructions for my participation in the procedure from the performing physician.  

## 2016-08-25 NOTE — Progress Notes (Signed)
Spontaneous respirations throughout. VSS. Resting comfortably. To PACU on room air. Report to  Jane RN. 

## 2016-08-25 NOTE — Patient Instructions (Signed)
YOU HAD AN ENDOSCOPIC PROCEDURE TODAY AT THE [facility]:   Refer to the procedure report that was given to you for any specific questions about what was found during the examination.  If the procedure report does not answer your questions, please call your gastroenterologist to clarify.  If you requested that your care partner not be given the details of your procedure findings, then the procedure report has been included in a sealed envelope for you to review at your convenience later.  YOU SHOULD EXPECT: Some feelings of bloating in the abdomen. Passage of more gas than usual.  Walking can help get rid of the air that was put into your GI tract during the procedure and reduce the bloating. If you had a lower endoscopy (such as a colonoscopy or flexible sigmoidoscopy) you may notice spotting of blood in your stool or on the toilet paper. If you underwent a bowel prep for your procedure, you may not have a normal bowel movement for a few days.  Please Note:  You might notice some irritation and congestion in your nose or some drainage.  This is from the oxygen used during your procedure.  There is no need for concern and it should clear up in a day or so.  SYMPTOMS TO REPORT IMMEDIATELY:   Following lower endoscopy (colonoscopy or flexible sigmoidoscopy):  Excessive amounts of blood in the stool  Significant tenderness or worsening of abdominal pains  Swelling of the abdomen that is new, acute  Fever of 100F or higher   Following upper endoscopy (EGD)  Vomiting of blood or coffee ground material  New chest pain or pain under the shoulder blades  Painful or persistently difficult swallowing  New shortness of breath  Fever of 100F or higher  Black, tarry-looking stools  For urgent or emergent issues, a gastroenterologist can be reached at any hour by calling [phone].   DIET:  We do recommend a small meal at first, but then you may proceed to your regular diet.  Drink  plenty of fluids but you should avoid alcoholic beverages for 24 hours.  ACTIVITY:  You should plan to take it easy for the rest of today and you should NOT DRIVE or use heavy machinery until tomorrow (because of the sedation medicines used during the test).    FOLLOW UP: Our staff will call the number listed on your records the next business day following your procedure to check on you and address any questions or concerns that you may have regarding the information given to you following your procedure. If we do not reach you, we will leave a message.  However, if you are feeling well and you are not experiencing any problems, there is no need to return our call.  We will assume that you have returned to your regular daily activities without incident.  If any biopsies were taken you will be contacted by phone or by letter within the next 1-3 weeks.  Please call us at [phone] if you have not heard about the biopsies in 3 weeks.    SIGNATURES/CONFIDENTIALITY: You and/or your care partner have signed paperwork which will be entered into your electronic medical record.  These signatures attest to the fact that that the information above on your After Visit Summary has been reviewed and is understood.  Full responsibility of the confidentiality of this discharge information lies with you and/or your care-partner.  Polyp, diverticulosis and high fiber diet information given. 

## 2016-08-25 NOTE — Progress Notes (Signed)
0910pt complains of trapped air. Give [id number] mylicon infant Drops and levsin per standing order.  VO Dr. Irena Cords RN.  Given as ordered.

## 2016-08-25 NOTE — Op Note (Addendum)
[facility] Patient Name: Cheryl Aguirre Procedure Date: 08/25/2016 7:22 AM MRN: [id number] Endoscopist: Mauri Pole , MD Age: 70 Referring MD:  Date of Birth: Apr 02, 1946 Gender: Female Account #: [id number] Procedure:                Colonoscopy Indications:              Family history of colon cancer in a first-degree                            relative, Solitary ulcer in ascending colon/cecum Medicines:                Monitored Anesthesia Care Procedure:                Pre-Anesthesia Assessment:                           - Prior to the procedure, a History and Physical                            was performed, and patient medications and                            allergies were reviewed. The patient's tolerance of                            previous anesthesia was also reviewed. The risks                            and benefits of the procedure and the sedation                            options and risks were discussed with the patient.                            All questions were answered, and informed consent                            was obtained. Prior Anticoagulants: The patient has                            taken no previous anticoagulant or antiplatelet                            agents. ASA Grade Assessment: II - A patient with                            mild systemic disease. After reviewing the risks                            and benefits, the patient was deemed in                            satisfactory condition to undergo the procedure.  After obtaining informed consent, the colonoscope                            was passed under direct vision. Throughout the                            procedure, the patient's blood pressure, pulse, and                            oxygen saturations were monitored continuously. The                            Model CF-HQ190L [id number]) scope was introduced   through the anus and advanced to the the cecum,                            identified by appendiceal orifice and ileocecal                            valve. The colonoscopy was somewhat difficult due                            to multiple diverticula in the colon, a tortuous                            colon and the patient's body habitus. Successful                            completion of the procedure was aided by                            withdrawing the scope and replacing with the                            pediatric colonoscope. The patient tolerated the                            procedure well. The quality of the bowel                            preparation was good. The ileocecal valve,                            appendiceal orifice, and rectum were photographed. Scope In: 8:15:41 AM Scope Out: [id number] AM Scope Withdrawal Time: 0 hours 19 minutes 26 seconds  Total Procedure Duration: 0 hours 33 minutes 38 seconds  Findings:                 The perianal and digital rectal examinations were                            normal.                           A 9 mm polyp was found  in the ascending colon next                            to mucosal scar tissue. The polyp was granular                            lateral spreading. The polyp was removed with a hot                            snare. Resection and retrieval were complete.                           Mucosa with scar tissue and active oozing of blood                            was present in the ascending colon likely site of                            prior solitary ulcer right outside the ileocecal                            valve. Biopsies were taken with a cold forceps for                            histology.                           Multiple small and large-mouthed diverticula were                            found in the sigmoid colon.                           Non-bleeding internal hemorrhoids were found during                             retroflexion. The hemorrhoids were medium-sized. Complications:            No immediate complications. Estimated Blood Loss:     Estimated blood loss was minimal. Impression:               - One 9 mm polyp in the ascending colon, removed                            with a hot snare. Resected and retrieved.                           - Mucosal scar in the ascending. Biopsied.                           - Diverticulosis in the sigmoid colon.                           - Non-bleeding internal hemorrhoids. Recommendation:           -  Patient has a contact number available for                            emergencies. The signs and symptoms of potential                            delayed complications were discussed with the                            patient. Return to normal activities tomorrow.                            Written discharge instructions were provided to the                            patient.                           - Resume previous diet.                           - Continue present medications.                           - Await pathology results.                           - Repeat colonoscopy in 1 year for surveillance                            based on pathology results.                           - Return to GI clinic PRN.                           - No aspirin, ibuprofen, naproxen, or other                            non-steroidal anti-inflammatory drugs.                           -Will consider starting Mesalamine (Lialda                            1.2-2.4gm daily x 6-12 months) after review of                            pathology results to improve mucosal healing Mauri Pole, MD 08/25/2016 9:00:54 AM This report has been signed electronically.

## 2016-08-25 NOTE — Telephone Encounter (Signed)
1035 Pt. Spouse called to say that she is having cramping in lower abdomen that feels like menstrual cramps. She has eaten small amount of soup with crackers and some coffee.  She passed liquid, blood tinged stool. States discomfort is a 5-6.  Dr. Silverio Decamp notified.  Orders for Levsin 0.125mg . Every 8 hours as needed-30 with One refill. Pt. May also try phazyme.

## 2016-08-25 NOTE — Progress Notes (Signed)
Pt. Discharged with abdominal cramping she described as Feeling like menstrual cramps.  Has passed small amount of gas.,  Will talk with her later today to assess her cramping.

## 2016-08-25 NOTE — Telephone Encounter (Signed)
1230 called pt. Back. Spoke with her husband, Shanon Brow.  He state she is sleeping and has been out in yard feeding Birds.  He was advised to pick up rx.  He will call back later if she doesn't respond to Levsin or if he feels he Need further advice.

## 2016-08-26 ENCOUNTER — Telehealth: Payer: Self-pay

## 2016-08-26 NOTE — Telephone Encounter (Signed)
  Follow up Call-  Call back number 08/25/2016 02/18/2016  Post procedure Call Back phone  # [phone]  Permission to leave phone message Yes Yes  Some recent data might be hidden    Patient was called for follow up after her procedure on 08/25/2016. No answer at the number given for follow up phone call. A message was left on the answering machine.

## 2016-08-26 NOTE — Telephone Encounter (Signed)
  Follow up Call-  Call back number 08/25/2016 02/18/2016  Post procedure Call Back phone  # [phone]  Permission to leave phone message Yes Yes  Some recent data might be hidden     Patient questions:  Do you have a fever, pain , or abdominal swelling? No. Pain Score  0 *  Have you tolerated food without any problems? Yes.    Have you been able to return to your normal activities? Yes.    Do you have any questions about your discharge instructions: Diet   No. Medications  No. Follow up visit  No.  Do you have questions or concerns about your Care? No.  Actions: * If pain score is 4 or above: No action needed, pain <4.

## 2016-08-31 ENCOUNTER — Encounter: Payer: Self-pay | Admitting: Gastroenterology

## 2016-10-09 DIAGNOSIS — N390 Urinary tract infection, site not specified: Secondary | ICD-10-CM | POA: Diagnosis not present

## 2016-11-14 ENCOUNTER — Telehealth: Payer: Self-pay | Admitting: Family Medicine

## 2016-11-14 DIAGNOSIS — K219 Gastro-esophageal reflux disease without esophagitis: Secondary | ICD-10-CM

## 2016-11-14 DIAGNOSIS — E7800 Pure hypercholesterolemia, unspecified: Secondary | ICD-10-CM

## 2016-11-14 DIAGNOSIS — R7303 Prediabetes: Secondary | ICD-10-CM | POA: Insufficient documentation

## 2016-11-14 DIAGNOSIS — Z1329 Encounter for screening for other suspected endocrine disorder: Secondary | ICD-10-CM | POA: Insufficient documentation

## 2016-11-14 DIAGNOSIS — R7309 Other abnormal glucose: Secondary | ICD-10-CM

## 2016-11-14 NOTE — Telephone Encounter (Signed)
-----   Message from Ellamae Sia sent at 11/10/2016  3:42 PM EST ----- Regarding: Lab orders for Wednesday, 2.14.18 Patient is scheduled for CPX labs, please order future labs, Thanks , Karna Christmas

## 2016-11-20 ENCOUNTER — Other Ambulatory Visit: Payer: Medicare Other

## 2016-11-20 ENCOUNTER — Ambulatory Visit (INDEPENDENT_AMBULATORY_CARE_PROVIDER_SITE_OTHER): Payer: Medicare Other

## 2016-11-20 VITALS — BP 124/66 | HR 84 | Temp 98.6°F | Ht 63.0 in | Wt 129.2 lb

## 2016-11-20 DIAGNOSIS — Z1159 Encounter for screening for other viral diseases: Secondary | ICD-10-CM

## 2016-11-20 DIAGNOSIS — K219 Gastro-esophageal reflux disease without esophagitis: Secondary | ICD-10-CM

## 2016-11-20 DIAGNOSIS — Z0000 Encounter for general adult medical examination without abnormal findings: Secondary | ICD-10-CM

## 2016-11-20 DIAGNOSIS — E7800 Pure hypercholesterolemia, unspecified: Secondary | ICD-10-CM | POA: Diagnosis not present

## 2016-11-20 DIAGNOSIS — R7309 Other abnormal glucose: Secondary | ICD-10-CM

## 2016-11-20 LAB — CBC WITH DIFFERENTIAL/PLATELET
BASOS ABS: 0.1 10*3/uL (ref 0.0–0.1)
Basophils Relative: 1.2 % (ref 0.0–3.0)
EOS PCT: 3.7 % (ref 0.0–5.0)
Eosinophils Absolute: 0.2 10*3/uL (ref 0.0–0.7)
HCT: 43.7 % (ref 36.0–46.0)
Hemoglobin: 14.8 g/dL (ref 12.0–15.0)
LYMPHS ABS: 1.6 10*3/uL (ref 0.7–4.0)
Lymphocytes Relative: 27.7 % (ref 12.0–46.0)
MCHC: 33.9 g/dL (ref 30.0–36.0)
MCV: 94.1 fl (ref 78.0–100.0)
MONO ABS: 0.7 10*3/uL (ref 0.1–1.0)
MONOS PCT: 11.9 % (ref 3.0–12.0)
NEUTROS ABS: 3.3 10*3/uL (ref 1.4–7.7)
NEUTROS PCT: 55.5 % (ref 43.0–77.0)
PLATELETS: 272 10*3/uL (ref 150.0–400.0)
RBC: 4.64 Mil/uL (ref 3.87–5.11)
RDW: 13.5 % (ref 11.5–15.5)
WBC: 6 10*3/uL (ref 4.0–10.5)

## 2016-11-20 LAB — LIPID PANEL
CHOL/HDL RATIO: 3
Cholesterol: 218 mg/dL — ABNORMAL HIGH (ref 0–200)
HDL: 83.4 mg/dL (ref >39.00)
LDL CALC: 118 mg/dL — AB (ref 0–99)
NonHDL: 134.77
TRIGLYCERIDES: 82 mg/dL (ref 0.0–149.0)
VLDL: 16.4 mg/dL (ref 0.0–40.0)

## 2016-11-20 LAB — COMPREHENSIVE METABOLIC PANEL
ALT: 16 U/L (ref 0–35)
AST: 24 U/L (ref 0–37)
Albumin: 4.5 g/dL (ref 3.5–5.2)
Alkaline Phosphatase: 97 U/L (ref 39–117)
BUN: 17 mg/dL (ref 6–23)
CO2: 30 meq/L (ref 19–32)
Calcium: 9.7 mg/dL (ref 8.4–10.5)
Chloride: 104 mEq/L (ref 96–112)
Creatinine, Ser: 0.89 mg/dL (ref 0.40–1.20)
GFR: 66.79 mL/min (ref >60.00)
GLUCOSE: 112 mg/dL — AB (ref 70–99)
POTASSIUM: 4.5 meq/L (ref 3.5–5.1)
SODIUM: 138 meq/L (ref 135–145)
Total Bilirubin: 0.4 mg/dL (ref 0.2–1.2)
Total Protein: 7.2 g/dL (ref 6.0–8.3)

## 2016-11-20 LAB — HEMOGLOBIN A1C: Hgb A1c MFr Bld: 5.8 % (ref 4.6–6.5)

## 2016-11-20 NOTE — Progress Notes (Signed)
Pre visit review using our clinic review tool, if applicable. No additional management support is needed unless otherwise documented below in the visit note. 

## 2016-11-20 NOTE — Progress Notes (Signed)
Subjective:   Cheryl Aguirre is a 71 y.o. female who presents for Medicare Annual (Subsequent) preventive examination.  Review of Systems:  N/A Cardiac Risk Factors include: advanced age (>35men, >68 women);smoking/ tobacco exposure     Objective:     Vitals: BP 124/66 (BP Location: Right Arm, Patient Position: Sitting, Cuff Size: Normal)   Pulse 84   Temp 98.6 F (37 C) (Oral)   Ht 5\' 3"  (1.6 m) Comment: no shoes  Wt 129 lb 4 oz (58.6 kg)   SpO2 94%   BMI 22.90 kg/m   Body mass index is 22.9 kg/m.   Tobacco History  Smoking Status  . Current Every Day Smoker  . Packs/day: 1.00  . Types: Cigarettes  Smokeless Tobacco  . Never Used     Ready to quit: No Counseling given: No   Past Medical History:  Diagnosis Date  . Anxiety   . Arthritis   . Chronic headaches   . Colon polyps   . Depression   . GERD (gastroesophageal reflux disease)   . IBS (irritable bowel syndrome)   . Insomnia   . Tobacco abuse    Past Surgical History:  Procedure Laterality Date  . ABDOMINAL HYSTERECTOMY     bleeding, fibroid, endometriosis (total)  . CT of chest  10/09   bronchiectasis  . LAPAROSCOPY  1984   Family History  Problem Relation Age of Onset  . Heart disease Other   . Cancer Mother     pancreatic, liver  . Cancer Father     colon  . Colon cancer Father 11  . Thyroid disease Sister   . Colon polyps Sister   . Irritable bowel syndrome Sister   . Cancer Maternal Aunt     breast  . Cancer Paternal Grandmother     thyroid  . Miscarriages / [place] Cousin   . Cancer Cousin     breast  . Diabetes Cousin   . Thyroid disease Sister    History  Sexual Activity  . Sexual activity: Yes    Outpatient Encounter Prescriptions as of 11/20/2016  Medication Sig  . Calcium Citrate-Vitamin D (CITRACAL/VITAMIN D) 250-200 MG-UNIT TABS Take 4 tablets by mouth daily.   . cetirizine (ZYRTEC) 10 MG tablet Take 10 mg by mouth daily.    . cyclobenzaprine (FLEXERIL) 10  MG tablet Take 10 mg by mouth as needed.  Marland Kitchen escitalopram (LEXAPRO) 20 MG tablet Take 1 tablet (20 mg total) by mouth daily.  . fish oil-omega-3 fatty acids 1000 MG capsule Take 2 g by mouth daily.   . hyoscyamine (LEVSIN SL) 0.125 MG SL tablet Place 1 tablet (0.125 mg total) under the tongue every 8 (eight) hours. (Patient taking differently: Place 0.125 mg under the tongue as needed. )  . Lactobacillus (ACIDOPHILUS) 100 MG CAPS Take 1 capsule by mouth daily.    . Magnesium 250 MG TABS Take 1 tablet by mouth daily.  . Melatonin 1 MG TABS As directed at bedtime.   . montelukast (SINGULAIR) 10 MG tablet Take 1 tablet (10 mg total) by mouth at bedtime.  . Multiple Vitamin (MULTIVITAMIN) tablet Take 1 tablet by mouth daily.    Marland Kitchen Phenylephrine-Bromphen-DM (PHENYLEPHRINE COMPLEX PO) Take 1 tablet up to every 4 hours as needed   . SUMAtriptan (IMITREX) 50 MG tablet Take 50 mg by mouth daily as needed. May repeat in 2 hours if headache persists or recurs.    Facility-Administered Encounter Medications as of 11/20/2016  Medication  .  0.9 %  sodium chloride infusion    Activities of Daily Living In your present state of health, do you have any difficulty performing the following activities: 11/20/2016  Hearing? N  Vision? N  Difficulty concentrating or making decisions? N  Walking or climbing stairs? N  Dressing or bathing? N  Doing errands, shopping? N  Preparing Food and eating ? N  Using the Toilet? N  In the past six months, have you accidently leaked urine? N  Do you have problems with loss of bowel control? Y  Managing your Medications? N  Managing your Finances? N  Housekeeping or managing your Housekeeping? N  Some recent data might be hidden    Patient Care Team: Abner Greenspan, MD as PCP - General Leandrew Koyanagi, MD as Referring Physician (Ophthalmology) Mauri Pole, MD as Consulting Physician (Gastroenterology)    Assessment:      Hearing Screening   125Hz  250Hz   500Hz  1000Hz  2000Hz  3000Hz  4000Hz  6000Hz  8000Hz   Right ear:   40 40 40  40    Left ear:   40 40 40  40    Vision Screening Comments: Last vision exam in March 2017 with Dr. Wallace Going   Exercise Activities and Dietary recommendations Current Exercise Habits: Home exercise routine, Type of exercise: walking;stretching, Time (Minutes): 30, Frequency (Times/Week): 7, Weekly Exercise (Minutes/Week): 210, Intensity: Moderate, Exercise limited by: None identified  Goals    . Increase physical activity          Starting 11/20/16, I will continue to walk and to stretch for at least 30 min daily.       Fall Risk Fall Risk  11/20/2016 11/22/2015 11/19/2014 11/07/2013 11/01/2012  Falls in the past year? Yes No No No No  Number falls in past yr: 1 - - - -  Injury with Fall? Yes - - - -   Depression Screen PHQ 2/9 Scores 11/20/2016 11/22/2015 11/19/2014 11/07/2013  PHQ - 2 Score 0 0 0 0     Cognitive Function MMSE - Mini Mental State Exam 11/20/2016  Orientation to time 5  Orientation to Place 5  Registration 3  Attention/ Calculation 0  Recall 3  Language- name 2 objects 0  Language- repeat 1  Language- follow 3 step command 3  Language- read & follow direction 0  Write a sentence 0  Copy design 0  Total score 20     PLEASE NOTE: A Mini-Cog screen was completed. Maximum score is 20. A value of 0 denotes this part of Folstein MMSE was not completed or the patient failed this part of the Mini-Cog screening.   Mini-Cog Screening Orientation to Time - Max 5 pts Orientation to Place - Max 5 pts Registration - Max 3 pts Recall - Max 3 pts Language Repeat - Max 1 pts Language Follow 3 Step Command - Max 3 pts     Immunization History  Administered Date(s) Administered  . Influenza Split 07/27/2011, 07/12/2013  . Influenza Whole 08/08/2002, 07/20/2007  . Influenza, High Dose Seasonal PF 08/01/2015  . Influenza, Seasonal, Injecte, Preservative Fre 08/04/2016  . Influenza-Unspecified 07/13/2014    . Pneumococcal Conjugate-13 11/19/2014  . Pneumococcal Polysaccharide-23 05/01/2005, 11/01/2012  . Td 03/13/1999, 07/24/2010  . Zoster 08/12/2014   Screening Tests Health Maintenance  Topic Date Due  . MAMMOGRAM  10/08/2017 (Originally 10/09/2016)  . COLONOSCOPY  08/26/2019  . TETANUS/TDAP  07/24/2020  . INFLUENZA VACCINE  Addressed  . DEXA SCAN  Completed  . ZOSTAVAX  Completed  .  Hepatitis C Screening  Completed  . PNA vac Low Risk Adult  Completed      Plan:     I have personally reviewed and addressed the Medicare Annual Wellness questionnaire and have noted the following in the patient's chart:  A. Medical and social history B. Use of alcohol, tobacco or illicit drugs  C. Current medications and supplements D. Functional ability and status E.  Nutritional status F.  Physical activity G. Advance directives H. List of other physicians I.  Hospitalizations, surgeries, and ER visits in previous 12 months J.  [place] to include hearing, vision, cognitive, depression L. Referrals and appointments - none  In addition, I have reviewed and discussed with patient certain preventive protocols, quality metrics, and best practice recommendations. A written personalized care plan for preventive services as well as general preventive health recommendations were provided to patient.  See attached scanned questionnaire for additional information.   Signed,   Lindell Noe, MHA, BS, LPN Health Coach'

## 2016-11-20 NOTE — Patient Instructions (Signed)
Cheryl Aguirre , Thank you for taking time to come for your Medicare Wellness Visit. I appreciate your ongoing commitment to your health goals. Please review the following plan we discussed and let me know if I can assist you in the future.   These are the goals we discussed: Goals    . Increase physical activity          Starting 11/20/16, I will continue to walk and to stretch for at least 30 min daily.        This is a list of the screening recommended for you and due dates:  Health Maintenance  Topic Date Due  . Mammogram  10/08/2017*  . Colon Cancer Screening  08/26/2019  . Tetanus Vaccine  07/24/2020  . Flu Shot  Addressed  . DEXA scan (bone density measurement)  Completed  . Shingles Vaccine  Completed  .  Hepatitis C: One time screening is recommended by Center for Disease Control  (CDC) for  adults born from [birth year] through 1964.   Completed  . Pneumonia vaccines  Completed  *Topic was postponed. The date shown is not the original due date.   Preventive Care for Adults  A healthy lifestyle and preventive care can promote health and wellness. Preventive health guidelines for adults include the following key practices.  . A routine yearly physical is a good way to check with your health care provider about your health and preventive screening. It is a chance to share any concerns and updates on your health and to receive a thorough exam.  . Visit your dentist for a routine exam and preventive care every 6 months. Brush your teeth twice a day and floss once a day. Good oral hygiene prevents tooth decay and gum disease.  . The frequency of eye exams is based on your age, health, family medical history, use  of contact lenses, and other factors. Follow your health care provider's ecommendations for frequency of eye exams.  . Eat a healthy diet. Foods like vegetables, fruits, whole grains, low-fat dairy products, and lean protein foods contain the nutrients you need without too many  calories. Decrease your intake of foods high in solid fats, added sugars, and salt. Eat the right amount of calories for you. Get information about a proper diet from your health care provider, if necessary.  . Regular physical exercise is one of the most important things you can do for your health. Most adults should get at least 150 minutes of moderate-intensity exercise (any activity that increases your heart rate and causes you to sweat) each week. In addition, most adults need muscle-strengthening exercises on 2 or more days a week.  Silver Sneakers may be a benefit available to you. To determine eligibility, you may visit the website: www.silversneakers.com or contact program at 220 067 4742 Mon-Fri between 8AM-8PM.   . Maintain a healthy weight. The body mass index (BMI) is a screening tool to identify possible weight problems. It provides an estimate of body fat based on height and weight. Your health care provider can find your BMI and can help you achieve or maintain a healthy weight.   For adults 20 years and older: ? A BMI below 18.5 is considered underweight. ? A BMI of 18.5 to 24.9 is normal. ? A BMI of 25 to 29.9 is considered overweight. ? A BMI of 30 and above is considered obese.   . Maintain normal blood lipids and cholesterol levels by exercising and minimizing your intake of saturated fat. Eat  a balanced diet with plenty of fruit and vegetables. Blood tests for lipids and cholesterol should begin at age 44 and be repeated every 5 years. If your lipid or cholesterol levels are high, you are over 50, or you are at high risk for heart disease, you may need your cholesterol levels checked more frequently. Ongoing high lipid and cholesterol levels should be treated with medicines if diet and exercise are not working.  . If you smoke, find out from your health care provider how to quit. If you do not use tobacco, please do not start.  . If you choose to drink alcohol, please do  not consume more than 2 drinks per day. One drink is considered to be 12 ounces (355 mL) of beer, 5 ounces (148 mL) of wine, or 1.5 ounces (44 mL) of liquor.  . If you are 61-41 years old, ask your health care provider if you should take aspirin to prevent strokes.  . Use sunscreen. Apply sunscreen liberally and repeatedly throughout the day. You should seek shade when your shadow is shorter than you. Protect yourself by wearing long sleeves, pants, a wide-brimmed hat, and sunglasses year round, whenever you are outdoors.  . Once a month, do a whole body skin exam, using a mirror to look at the skin on your back. Tell your health care provider of new moles, moles that have irregular borders, moles that are larger than a pencil eraser, or moles that have changed in shape or color.

## 2016-11-20 NOTE — Progress Notes (Signed)
PCP notes:   Health maintenance:  Mammogram - addressed; pt will schedule future appt Hep C screening- completed  Abnormal screenings:   Fall risk - hx of fall with injury   Patient concerns:   None  Nurse concerns:  None  Next PCP appt:   11/23/16 @ 1500  I reviewed health advisor's note, was available for consultation, and agree with documentation and plan. Loura Pardon MD

## 2016-11-21 LAB — HEPATITIS C ANTIBODY: HCV AB: NEGATIVE

## 2016-11-23 ENCOUNTER — Encounter: Payer: Self-pay | Admitting: Family Medicine

## 2016-11-23 ENCOUNTER — Ambulatory Visit (INDEPENDENT_AMBULATORY_CARE_PROVIDER_SITE_OTHER): Payer: Medicare Other | Admitting: Family Medicine

## 2016-11-23 VITALS — BP 112/70 | HR 78 | Temp 97.7°F | Ht 63.0 in | Wt 131.2 lb

## 2016-11-23 DIAGNOSIS — Z800 Family history of malignant neoplasm of digestive organs: Secondary | ICD-10-CM | POA: Diagnosis not present

## 2016-11-23 DIAGNOSIS — E7800 Pure hypercholesterolemia, unspecified: Secondary | ICD-10-CM

## 2016-11-23 DIAGNOSIS — E2839 Other primary ovarian failure: Secondary | ICD-10-CM

## 2016-11-23 DIAGNOSIS — F418 Other specified anxiety disorders: Secondary | ICD-10-CM

## 2016-11-23 DIAGNOSIS — M8589 Other specified disorders of bone density and structure, multiple sites: Secondary | ICD-10-CM | POA: Diagnosis not present

## 2016-11-23 DIAGNOSIS — F17200 Nicotine dependence, unspecified, uncomplicated: Secondary | ICD-10-CM | POA: Diagnosis not present

## 2016-11-23 DIAGNOSIS — R739 Hyperglycemia, unspecified: Secondary | ICD-10-CM

## 2016-11-23 MED ORDER — ESCITALOPRAM OXALATE 20 MG PO TABS: 20.0000 mg | ORAL_TABLET | Freq: Every day | ORAL | 3 refills | Status: DC

## 2016-11-23 MED ORDER — MONTELUKAST SODIUM 10 MG PO TABS: 10.0000 mg | ORAL_TABLET | Freq: Every day | ORAL | 3 refills | Status: DC

## 2016-11-23 NOTE — Assessment & Plan Note (Signed)
utd colonoscopy and following closely

## 2016-11-23 NOTE — Assessment & Plan Note (Signed)
Doing well with lexapro-refilled Reviewed stressors/ coping techniques/symptoms/ support sources/ tx options and side effects in detail today

## 2016-11-23 NOTE — Assessment & Plan Note (Signed)
Disc goals for lipids and reasons to control them Rev labs with pt Rev low sat fat diet in detail As a smoker -goal for LDL is 100 or less Will work on diet Can consider statin

## 2016-11-23 NOTE — Assessment & Plan Note (Signed)
Pt wants to get dexa  Due for it  Will refer  On ca and D  Exercises  One fall/ no fx Smoker- aware that is a risk factor

## 2016-11-23 NOTE — Progress Notes (Signed)
Pre visit review using our clinic review tool, if applicable. No additional management support is needed unless otherwise documented below in the visit note. 

## 2016-11-23 NOTE — Assessment & Plan Note (Signed)
Disc in detail risks of smoking and possible outcomes including copd, vascular/ heart disease, cancer , respiratory and sinus infections  Pt voices understanding Literature given  She states she is not ready to quit

## 2016-11-23 NOTE — Patient Instructions (Addendum)
[facility]'t forget [facility]  Keep taking your calcium and vitamin D   For cholesterol   Avoid red meat/ fried foods/ egg yolks/ fatty breakfast meats/ butter, cheese and high fat dairy/ and shellfish    Also try to avoid excessive sugar and refined carbs   A statin may help decrease your vascular disease risk- if you want to start one please let me know   nasacort and flonase over the counter are helpful for allergy symptoms

## 2016-11-23 NOTE — Assessment & Plan Note (Signed)
Lab Results  Component Value Date   HGBA1C 5.8 11/20/2016   Will continue to watch  Disc low refined carb diet

## 2016-11-23 NOTE — Progress Notes (Signed)
Subjective:    Patient ID: Cheryl Aguirre, female    DOB: [birth year], 71 y.o.   MRN: [id number]  HPI Here for annual f/u of chronic medical problems   Has been feeling good    Had AMW visit on 11/20/16  Noted hx of fall with injury She slid off some stairs and fell all the way down - bumped her elbow -otherwise was fine    Wt Readings from Last 3 Encounters:  11/23/16 131 lb 4 oz (59.5 kg)  11/20/16 129 lb 4 oz (58.6 kg)  08/25/16 134 lb (60.8 kg)  weight is good  Tries to exercise when she can -stays very active (joints are getting stiff)  Eats well  bmi 23.2  Hep C screen is negative   Mammogram 12/16 neg - is due for that  She will schedule it (she had to re schedule because she was sick)  Self breast exam - no lumps   Pap/gyn care - nl pap 2006 Had a hysterectmy due to fibroids and endometriosis /total No gyn symptoms  More utis lately  She takes a probiotic as well to help that also has some cranberry in it  Drinks lots of water   Vaccines are all up to date   Colonoscopy/ screening 11/17 tubular adenoma after one with an ulcer in may  Per pt - was told 3-5 years  Father had colon cancer   dexa 9/12 osteopenia  One fall  No fractues  Wants to do a dexa  Takes her ca and D    Smoking status-still the same - 1ppd or more  Not ready to quit  Not having any breathing problems    Hx of hyperlipidemia Lab Results  Component Value Date   CHOL 218 (H) 11/20/2016   CHOL 200 11/20/2015   CHOL 171 11/12/2014   Lab Results  Component Value Date   HDL 83.40 11/20/2016   HDL 82.10 11/20/2015   HDL 64.90 11/12/2014   Lab Results  Component Value Date   LDLCALC 118 (H) 11/20/2016   LDLCALC 107 (H) 11/20/2015   [place] 90 11/12/2014   Lab Results  Component Value Date   TRIG 82.0 11/20/2016   TRIG 58.0 11/20/2015   TRIG 79.0 11/12/2014   Lab Results  Component Value Date   CHOLHDL 3 11/20/2016   CHOLHDL 2 11/20/2015   CHOLHDL 3 11/12/2014    Lab Results  Component Value Date   LDLDIRECT 138.2 11/01/2013   LDLDIRECT 109.6 10/26/2012  no fam hx of high cholesterol  Eating shellfish - a lot more  Very little red meat  Seldom sausage/bacon  Also eating some ice cream     Hx of elevated glucose level in the past Lab Results  Component Value Date   HGBA1C 5.8 11/20/2016  she watches sweets and refined carbohydrates   Takes lexapro for anxiety and depression  Working well - has not had success getting off of it  Stressors are about the same  Patient Active Problem List   Diagnosis Date Noted  . Estrogen deficiency 11/23/2016  . Hyperglycemia 11/14/2016  . Screening for thyroid disorder 11/14/2016  . Family history of colon cancer 11/22/2015  . Colon cancer screening 11/22/2015  . Encounter for Medicare annual wellness exam 10/31/2013  . Headache(784.0) 11/01/2012  . Gynecologic exam normal 09/15/2011  . Routine general medical examination at a health care facility 09/07/2011  . ONYCHOMYCOSIS 07/24/2010  . PURE HYPERCHOLESTEROLEMIA 07/21/2010  . COLONIC POLYPS, HYPERPLASTIC, HX OF  02/18/2010  . Nonspecific (abnormal) findings on radiological and other examination of body structure 08/02/2008  . [place] LUNG FIELD 08/02/2008  . Osteopenia 07/20/2007  . ANXIETY 02/11/2007  . TOBACCO ABUSE 02/11/2007  . Depression with anxiety 02/11/2007  . GERD 02/11/2007  . INSOMNIA 02/11/2007  . FIBROCYSTIC BREAST DISEASE, HX OF 02/11/2007   Past Medical History:  Diagnosis Date  . Anxiety   . Arthritis   . Chronic headaches   . Colon polyps   . Depression   . GERD (gastroesophageal reflux disease)   . IBS (irritable bowel syndrome)   . Insomnia   . Tobacco abuse    Past Surgical History:  Procedure Laterality Date  . ABDOMINAL HYSTERECTOMY     bleeding, fibroid, endometriosis (total)  . CT of chest  10/09   bronchiectasis  . LAPAROSCOPY  1984   Social History  Substance Use  Topics  . Smoking status: Current Every Day Smoker    Packs/day: 1.00    Types: Cigarettes  . Smokeless tobacco: Never Used  . Alcohol use 0.0 oz/week     Comment: occasional   Family History  Problem Relation Age of Onset  . Heart disease Other   . Cancer Mother     pancreatic, liver  . Cancer Father     colon  . Colon cancer Father 33  . Thyroid disease Sister   . Colon polyps Sister   . Irritable bowel syndrome Sister   . Cancer Maternal Aunt     breast  . Cancer Paternal Grandmother     thyroid  . Miscarriages / [place] Cousin   . Cancer Cousin     breast  . Diabetes Cousin   . Thyroid disease Sister    Allergies  Allergen Reactions  . Alendronate Sodium     REACTION: muscle, joint pain, indigestion  . Doxycycline     REACTION: ? rash or n/v  . Penicillins   . Varenicline Tartrate     REACTION: nausea   Current Outpatient Prescriptions on File Prior to Visit  Medication Sig Dispense Refill  . Calcium Citrate-Vitamin D (CITRACAL/VITAMIN D) 250-200 MG-UNIT TABS Take 4 tablets by mouth daily.     . cetirizine (ZYRTEC) 10 MG tablet Take 10 mg by mouth daily.      . cyclobenzaprine (FLEXERIL) 10 MG tablet Take 10 mg by mouth as needed.    . fish oil-omega-3 fatty acids 1000 MG capsule Take 2 g by mouth daily.     . hyoscyamine (LEVSIN SL) 0.125 MG SL tablet Place 1 tablet (0.125 mg total) under the tongue every 8 (eight) hours. (Patient taking differently: Place 0.125 mg under the tongue as needed. ) 30 tablet 1  . Lactobacillus (ACIDOPHILUS) 100 MG CAPS Take 1 capsule by mouth daily.      . Magnesium 250 MG TABS Take 1 tablet by mouth daily.    . Melatonin 1 MG TABS As directed at bedtime.     . Multiple Vitamin (MULTIVITAMIN) tablet Take 1 tablet by mouth daily.      Marland Kitchen Phenylephrine-Bromphen-DM (PHENYLEPHRINE COMPLEX PO) Take 1 tablet up to every 4 hours as needed     . SUMAtriptan (IMITREX) 50 MG tablet Take 50 mg by mouth daily as needed. May repeat in 2  hours if headache persists or recurs.      Current Facility-Administered Medications on File Prior to Visit  Medication Dose Route Frequency Provider Last Rate Last Dose  .  0.9 %  sodium chloride infusion  500 mL Intravenous Continuous Kavitha Nandigam V, MD        Review of Systems Review of Systems  Constitutional: Negative for fever, appetite change, fatigue and unexpected weight change.  Eyes: Negative for pain and visual disturbance.  Respiratory: Negative for cough and shortness of breath.   Cardiovascular: Negative for cp or palpitations    Gastrointestinal: Negative for nausea, diarrhea and constipation.  Genitourinary: Negative for urgency and frequency. pos for occ utis Skin: Negative for pallor or rash   Neurological: Negative for weakness, light-headedness, numbness and headaches.  Hematological: Negative for adenopathy. Does not bruise/bleed easily.  Psychiatric/Behavioral: Negative for dysphoric mood. The patient is not nervous/anxious.         Objective:   Physical Exam  Constitutional: She appears well-developed and well-nourished. No distress.  Well appearing   HENT:  Head: Normocephalic and atraumatic.  Right Ear: External ear normal.  Left Ear: External ear normal.  Mouth/Throat: Oropharynx is clear and moist.  Eyes: Conjunctivae and EOM are normal. Pupils are equal, round, and reactive to light. No scleral icterus.  Neck: Normal range of motion. Neck supple. No JVD present. Carotid bruit is not present. No thyromegaly present.  Cardiovascular: Normal rate, regular rhythm, normal heart sounds and intact distal pulses.  Exam reveals no gallop.   Some changes of raynaud's on feet- toes are hyperemic They are warm with good pedal pulses   Pulmonary/Chest: Effort normal and breath sounds normal. No respiratory distress. She has no wheezes. She exhibits no tenderness.  bs are slightly distant Good air exch  Abdominal: Soft. Bowel sounds are normal. She exhibits no  distension, no abdominal bruit and no mass. There is no tenderness.  Genitourinary: No breast swelling, tenderness, discharge or bleeding.  Genitourinary Comments: Breast exam: No mass, nodules, thickening, tenderness, bulging, retraction, inflamation, nipple discharge or skin changes noted.  No axillary or clavicular LA.      Musculoskeletal: Normal range of motion. She exhibits no edema or tenderness.   No kyphosis   Lymphadenopathy:    She has no cervical adenopathy.  Neurological: She is alert. She has normal reflexes. No cranial nerve deficit. She exhibits normal muscle tone. Coordination normal.  Skin: Skin is warm and dry. No rash noted. No erythema. No pallor.  Solar lentigines diffusely  Psychiatric: She has a normal mood and affect.          Assessment & Plan:   Problem List Items Addressed This Visit      Musculoskeletal and Integument   Osteopenia - Primary    Pt wants to get dexa  Due for it  Will refer  On ca and D  Exercises  One fall/ no fx Smoker- aware that is a risk factor        Other   Depression with anxiety    Doing well with lexapro-refilled Reviewed stressors/ coping techniques/symptoms/ support sources/ tx options and side effects in detail today       Estrogen deficiency   Relevant Orders   DG Bone Density   Family history of colon cancer    utd colonoscopy and following closely      Hyperglycemia    Lab Results  Component Value Date   HGBA1C 5.8 11/20/2016   Will continue to watch  Disc low refined carb diet       PURE HYPERCHOLESTEROLEMIA    Disc goals for lipids and reasons to control them Rev labs with pt Rev low  sat fat diet in detail As a smoker -goal for LDL is 100 or less Will work on diet Can consider statin       TOBACCO ABUSE    Disc in detail risks of smoking and possible outcomes including copd, vascular/ heart disease, cancer , respiratory and sinus infections  Pt voices understanding Literature given  She  states she is not ready to quit

## 2016-11-25 ENCOUNTER — Other Ambulatory Visit: Payer: Medicare Other

## 2016-12-03 ENCOUNTER — Other Ambulatory Visit: Payer: Self-pay | Admitting: Family Medicine

## 2016-12-03 DIAGNOSIS — Z1231 Encounter for screening mammogram for malignant neoplasm of breast: Secondary | ICD-10-CM

## 2016-12-21 ENCOUNTER — Other Ambulatory Visit: Payer: Medicare Other

## 2016-12-21 ENCOUNTER — Ambulatory Visit: Payer: Medicare Other

## 2017-01-01 DIAGNOSIS — H2513 Age-related nuclear cataract, bilateral: Secondary | ICD-10-CM | POA: Diagnosis not present

## 2017-01-08 ENCOUNTER — Ambulatory Visit
Admission: RE | Admit: 2017-01-08 | Discharge: 2017-01-08 | Disposition: A | Discharge status: Home / Self Care | Payer: Medicare Other | Source: Ambulatory Visit | Attending: Family Medicine | Admitting: Family Medicine

## 2017-01-08 DIAGNOSIS — Z1231 Encounter for screening mammogram for malignant neoplasm of breast: Secondary | ICD-10-CM

## 2017-01-08 DIAGNOSIS — M8589 Other specified disorders of bone density and structure, multiple sites: Secondary | ICD-10-CM | POA: Diagnosis not present

## 2017-01-08 DIAGNOSIS — Z780 Asymptomatic menopausal state: Secondary | ICD-10-CM | POA: Diagnosis not present

## 2017-01-08 DIAGNOSIS — E2839 Other primary ovarian failure: Secondary | ICD-10-CM

## 2017-01-12 ENCOUNTER — Encounter: Payer: Self-pay | Admitting: *Deleted

## 2017-01-12 ENCOUNTER — Other Ambulatory Visit: Payer: Self-pay | Admitting: Family Medicine

## 2017-01-12 DIAGNOSIS — R928 Other abnormal and inconclusive findings on diagnostic imaging of breast: Secondary | ICD-10-CM

## 2017-01-14 DIAGNOSIS — H2513 Age-related nuclear cataract, bilateral: Secondary | ICD-10-CM | POA: Diagnosis not present

## 2017-01-15 ENCOUNTER — Ambulatory Visit
Admission: RE | Admit: 2017-01-15 | Discharge: 2017-01-15 | Disposition: A | Discharge status: Home / Self Care | Payer: Medicare Other | Source: Ambulatory Visit | Attending: Family Medicine | Admitting: Family Medicine

## 2017-01-15 DIAGNOSIS — R928 Other abnormal and inconclusive findings on diagnostic imaging of breast: Secondary | ICD-10-CM

## 2017-01-15 DIAGNOSIS — N6489 Other specified disorders of breast: Secondary | ICD-10-CM | POA: Diagnosis not present

## 2017-01-15 DIAGNOSIS — R922 Inconclusive mammogram: Secondary | ICD-10-CM | POA: Diagnosis not present

## 2017-01-25 DIAGNOSIS — H2513 Age-related nuclear cataract, bilateral: Secondary | ICD-10-CM | POA: Diagnosis not present

## 2017-02-04 ENCOUNTER — Encounter: Payer: Self-pay | Admitting: *Deleted

## 2017-02-11 ENCOUNTER — Ambulatory Visit: Payer: Medicare Other | Admitting: Anesthesiology

## 2017-02-11 ENCOUNTER — Ambulatory Visit
Admission: RE | Admit: 2017-02-11 | Discharge: 2017-02-11 | Disposition: A | Discharge status: Home / Self Care | Payer: Medicare Other | Source: Ambulatory Visit | Attending: Ophthalmology | Admitting: Ophthalmology

## 2017-02-11 ENCOUNTER — Encounter
Admission: RE | Disposition: A | Discharge status: Home / Self Care | Payer: Self-pay | Source: Ambulatory Visit | Attending: Ophthalmology

## 2017-02-11 ENCOUNTER — Encounter: Payer: Self-pay | Admitting: *Deleted

## 2017-02-11 DIAGNOSIS — J449 Chronic obstructive pulmonary disease, unspecified: Secondary | ICD-10-CM | POA: Diagnosis not present

## 2017-02-11 DIAGNOSIS — H2513 Age-related nuclear cataract, bilateral: Secondary | ICD-10-CM | POA: Diagnosis not present

## 2017-02-11 DIAGNOSIS — R51 Headache: Secondary | ICD-10-CM | POA: Insufficient documentation

## 2017-02-11 DIAGNOSIS — F419 Anxiety disorder, unspecified: Secondary | ICD-10-CM | POA: Diagnosis not present

## 2017-02-11 DIAGNOSIS — F418 Other specified anxiety disorders: Secondary | ICD-10-CM | POA: Diagnosis not present

## 2017-02-11 DIAGNOSIS — M1990 Unspecified osteoarthritis, unspecified site: Secondary | ICD-10-CM | POA: Diagnosis not present

## 2017-02-11 DIAGNOSIS — K219 Gastro-esophageal reflux disease without esophagitis: Secondary | ICD-10-CM | POA: Insufficient documentation

## 2017-02-11 DIAGNOSIS — F329 Major depressive disorder, single episode, unspecified: Secondary | ICD-10-CM | POA: Insufficient documentation

## 2017-02-11 DIAGNOSIS — G4700 Insomnia, unspecified: Secondary | ICD-10-CM | POA: Diagnosis not present

## 2017-02-11 DIAGNOSIS — F17200 Nicotine dependence, unspecified, uncomplicated: Secondary | ICD-10-CM | POA: Insufficient documentation

## 2017-02-11 DIAGNOSIS — H2512 Age-related nuclear cataract, left eye: Secondary | ICD-10-CM | POA: Diagnosis present

## 2017-02-11 HISTORY — PX: CATARACT EXTRACTION W/PHACO: SHX586

## 2017-02-11 SURGERY — PHACOEMULSIFICATION, CATARACT, WITH IOL INSERTION
Anesthesia: Monitor Anesthesia Care | Site: Eye | Laterality: Left | Wound class: Clean

## 2017-02-11 MED ORDER — NEOMYCIN-POLYMYXIN-DEXAMETH 0.1 % OP OINT
TOPICAL_OINTMENT | OPHTHALMIC | Status: DC | PRN
Start: 2017-02-11 — End: 2017-02-11
  Administered 2017-02-11: 1 via OPHTHALMIC

## 2017-02-11 MED ORDER — ARMC OPHTHALMIC DILATING DROPS
1.0000 "application " | OPHTHALMIC | Status: AC
  Administered 2017-02-11 (×3): 1 via OPHTHALMIC

## 2017-02-11 MED ORDER — MOXIFLOXACIN HCL 0.5 % OP SOLN
OPHTHALMIC | Status: AC
  Filled 2017-02-11: qty 3

## 2017-02-11 MED ORDER — CARBACHOL 0.01 % IO SOLN
INTRAOCULAR | Status: DC | PRN
  Administered 2017-02-11: 0.5 mL via INTRAOCULAR

## 2017-02-11 MED ORDER — FENTANYL CITRATE (PF) 100 MCG/2ML IJ SOLN
INTRAMUSCULAR | Status: AC
  Filled 2017-02-11: qty 2

## 2017-02-11 MED ORDER — MOXIFLOXACIN HCL 0.5 % OP SOLN
1.0000 [drp] | OPHTHALMIC | Status: AC
  Administered 2017-02-11 (×3): 1 [drp] via OPHTHALMIC

## 2017-02-11 MED ORDER — POVIDONE-IODINE 5 % OP SOLN
OPHTHALMIC | Status: DC | PRN
  Administered 2017-02-11: 1 via OPHTHALMIC

## 2017-02-11 MED ORDER — NA HYALUR & NA CHOND-NA HYALUR 0.4-0.35 ML IO KIT
PACK | INTRAOCULAR | Status: DC | PRN
  Administered 2017-02-11: .35 mL via INTRAOCULAR

## 2017-02-11 MED ORDER — ARMC OPHTHALMIC DILATING DROPS
OPHTHALMIC | Status: AC
  Filled 2017-02-11: qty 0.4

## 2017-02-11 MED ORDER — FENTANYL CITRATE (PF) 100 MCG/2ML IJ SOLN
INTRAMUSCULAR | Status: DC | PRN
  Administered 2017-02-11 (×2): 50 ug via INTRAVENOUS

## 2017-02-11 MED ORDER — EPINEPHRINE PF 1 MG/ML IJ SOLN
INTRAMUSCULAR | Status: AC
  Filled 2017-02-11: qty 2

## 2017-02-11 MED ORDER — LIDOCAINE HCL (PF) 4 % IJ SOLN
INTRAOCULAR | Status: DC | PRN
  Administered 2017-02-11: 4 mL via OPHTHALMIC

## 2017-02-11 MED ORDER — LIDOCAINE HCL (PF) 4 % IJ SOLN
INTRAMUSCULAR | Status: AC
  Filled 2017-02-11: qty 5

## 2017-02-11 MED ORDER — POVIDONE-IODINE 5 % OP SOLN
OPHTHALMIC | Status: AC
  Filled 2017-02-11: qty 30

## 2017-02-11 MED ORDER — MIDAZOLAM HCL 2 MG/2ML IJ SOLN
INTRAMUSCULAR | Status: AC
  Filled 2017-02-11: qty 2

## 2017-02-11 MED ORDER — MIDAZOLAM HCL 2 MG/2ML IJ SOLN
INTRAMUSCULAR | Status: DC | PRN
  Administered 2017-02-11 (×2): 1 mg via INTRAVENOUS

## 2017-02-11 MED ORDER — EPINEPHRINE PF 1 MG/ML IJ SOLN
INTRAOCULAR | Status: DC | PRN
  Administered 2017-02-11: 09:00:00 via OPHTHALMIC

## 2017-02-11 MED ORDER — BACITRACIN-NEOMYCIN-POLYMYXIN 400-5-5000 EX OINT
TOPICAL_OINTMENT | CUTANEOUS | Status: AC
  Filled 2017-02-11: qty 1

## 2017-02-11 MED ORDER — SODIUM CHLORIDE 0.9 % IV SOLN
INTRAVENOUS | Status: DC
  Administered 2017-02-11: 09:00:00 via INTRAVENOUS

## 2017-02-11 SURGICAL SUPPLY — 24 items
CANNULA ANT/CHMB 27GA (MISCELLANEOUS) ×3 IMPLANT
CUP MEDICINE 2OZ PLAST GRAD ST (MISCELLANEOUS) ×3 IMPLANT
GLOVE BIO SURGEON STRL SZ8 (GLOVE) ×3 IMPLANT
GLOVE BIOGEL M 6.5 STRL (GLOVE) ×3 IMPLANT
GLOVE SURG LX 7.5 STRW (GLOVE) ×2
GLOVE SURG LX STRL 7.5 STRW (GLOVE) ×1 IMPLANT
GOWN STRL REUS W/ TWL LRG LVL3 (GOWN DISPOSABLE) ×2 IMPLANT
GOWN STRL REUS W/TWL LRG LVL3 (GOWN DISPOSABLE) ×4
KIT SLEEVE INFUSION .9 MICRO (MISCELLANEOUS) ×3 IMPLANT
LENS IOL SYMFON TORIC 150 22.0 ×1 IMPLANT
LENS IOL SYMFONY TORIC 22.0 ×2 IMPLANT
LENS IOL SYMFONY TRC 150 22.0 ×1 IMPLANT
PACK CATARACT (MISCELLANEOUS) ×3 IMPLANT
PACK CATARACT BRASINGTON LX (MISCELLANEOUS) ×3 IMPLANT
PACK EYE AFTER SURG (MISCELLANEOUS) ×3 IMPLANT
SOL BSS BAG (MISCELLANEOUS) ×3
SOL PREP PVP 2OZ (MISCELLANEOUS) ×3
SOLUTION BSS BAG (MISCELLANEOUS) ×1 IMPLANT
SOLUTION PREP PVP 2OZ (MISCELLANEOUS) ×1 IMPLANT
SYR 3ML LL SCALE MARK (SYRINGE) ×3 IMPLANT
SYR 5ML LL (SYRINGE) ×3 IMPLANT
SYR TB 1ML 27GX1/2 LL (SYRINGE) ×3 IMPLANT
WATER STERILE IRR 250ML POUR (IV SOLUTION) ×3 IMPLANT
WIPE NON LINTING 3.25X3.25 (MISCELLANEOUS) ×3 IMPLANT

## 2017-02-11 NOTE — Op Note (Signed)
  PREOPERATIVE DIAGNOSIS:  Nuclear sclerotic cataract of the left eye.  H25.12  POSTOPERATIVE DIAGNOSIS:  Nuclear sclerotic cataract of the left eye.   PROCEDURE:  Phacoemulsification with Toric posterior chamber intraocular lens placement of the left eye.   LENS:  Implant Name Type Inv. Item Serial No. Manufacturer Lot No. LRB No. Used  LENS IOL TORIC SYMFONY 22.0 - [id number]   LENS IOL TORIC SYMFONY 22.0 [id number] AMO   Left 1   [id number] Symfony Toric intraocular lens with 1.5 diopters of cylindrical power with axis orientation at 106 degrees.   ULTRASOUND TIME: 14 % of 1 minutes, 3 seconds.  CDE 8.6   SURGEON:  Wyonia Hough, MD   ANESTHESIA:  Topical with tetracaine drops and 2% Xylocaine jelly, augmented with 1% preservative-free intracameral lidocaine.  COMPLICATIONS:  None.   DESCRIPTION OF PROCEDURE:  The patient was identified in the holding room and transported to the operating suite and placed in the supine position under the operating microscope.  The left eye was identified as the operative eye, and it was prepped and draped in the usual sterile ophthalmic fashion.    A clear-corneal paracentesis incision was made at the 1:30 position.  0.5 ml of preservative-free 1% lidocaine was injected into the anterior chamber. The anterior chamber was filled with Viscoat.  A 2.4 millimeter near clear corneal incision was then made at the 10:30 position.  A cystotome and capsulorrhexis forceps were then used to make a curvilinear capsulorrhexis.  Hydrodissection and hydrodelineation were then performed using balanced salt solution.   Phacoemulsification was then used in stop and chop fashion to remove the lens, nucleus and epinucleus.  The remaining cortex was aspirated using the irrigation and aspiration handpiece.  Provisc viscoelastic was then placed into the capsular bag to distend it for lens placement.  The Verion digital marker was used to align the implant at the intended  axis.   A 22.0 diopter lens was then injected into the capsular bag.  It was rotated clockwise until the axis marks on the lens were approximately 15 degrees in the counterclockwise direction to the intended alignment.  The viscoelastic was aspirated from the eye using the irrigation aspiration handpiece.  Then, a Koch spatula through the sideport incision was used to rotate the lens in a clockwise direction until the axis markings of the intraocular lens were lined up with the Verion alignment.  Balanced salt solution was then used to hydrate the wounds. Miostat was placed into the eye.  Vigamox 0.2 ml of a 1mg  per ml solution was injected into the anterior chamber for a dose of 0.2 mg of intracameral antibiotic at the completion of the case.    The eye was noted to have a physiologic pressure and there was no wound leak noted.   Maxitrol ointment was applied to the eye.  The patient was taken to the recovery room in stable condition having had no complications of anesthesia or surgery.  Bedie Dominey 02/11/2017, 9:07 AM

## 2017-02-11 NOTE — Anesthesia Postprocedure Evaluation (Signed)
Anesthesia Post Note  Patient: Cheryl Aguirre  Procedure(s) Performed: Procedure(s) (LRB): CATARACT EXTRACTION PHACO AND INTRAOCULAR LENS PLACEMENT (IOC) (Left)  Patient location during evaluation: Short Stay Anesthesia Type: MAC Level of consciousness: awake and alert and oriented Pain management: pain level controlled Vital Signs Assessment: post-procedure vital signs reviewed and stable Respiratory status: spontaneous breathing Cardiovascular status: stable Postop Assessment: no headache and adequate PO intake Anesthetic complications: no     Last Vitals:  Vitals:   02/11/17 0911 02/11/17 0914  BP: 122/69 122/69  Pulse: 66 70  Resp: 18 16  Temp: 37 C 37 C    Last Pain:  Vitals:   02/11/17 0914  TempSrc: Oral                 Lanora Manis

## 2017-02-11 NOTE — Anesthesia Preprocedure Evaluation (Signed)
Anesthesia Evaluation  Patient identified by MRN, date of birth, ID band Patient awake    Reviewed: Allergy & Precautions, H&P , NPO status , Patient's Chart, lab work & pertinent test results  History of Anesthesia Complications Negative for: history of anesthetic complications  Airway Mallampati: III  TM Distance: >3 FB Neck ROM: full    Dental  (+) Poor Dentition, Chipped   Pulmonary neg shortness of breath, COPD, Current Smoker,    Pulmonary exam normal breath sounds clear to auscultation       Cardiovascular Exercise Tolerance: Good (-) angina(-) Past MI and (-) DOE negative cardio ROS Normal cardiovascular exam Rhythm:regular Rate:Normal     Neuro/Psych  Headaches, PSYCHIATRIC DISORDERS Anxiety Depression    GI/Hepatic Neg liver ROS, GERD  Controlled,  Endo/Other  negative endocrine ROS  Renal/GU      Musculoskeletal  (+) Arthritis ,   Abdominal   Peds  Hematology negative hematology ROS (+)   Anesthesia Other Findings Past Medical History: No date: Anxiety No date: Arthritis No date: Chronic headaches No date: Colon polyps No date: Depression No date: GERD (gastroesophageal reflux disease) No date: IBS (irritable bowel syndrome) No date: Insomnia No date: Tobacco abuse  Past Surgical History: No date: ABDOMINAL HYSTERECTOMY     Comment: bleeding, fibroid, endometriosis (total) 10/09: CT of chest     Comment: bronchiectasis 1984: LAPAROSCOPY  BMI    Body Mass Index:  22.32 kg/m      Reproductive/Obstetrics negative OB ROS                             Anesthesia Physical Anesthesia Plan  ASA: III  Anesthesia Plan: MAC   Post-op Pain Management:    Induction: Intravenous  Airway Management Planned: Natural Airway and Nasal Cannula  Additional Equipment:   Intra-op Plan:   Post-operative Plan:   Informed Consent: I have reviewed the patients History and  Physical, chart, labs and discussed the procedure including the risks, benefits and alternatives for the proposed anesthesia with the patient or authorized representative who has indicated his/her understanding and acceptance.   Dental Advisory Given  Plan Discussed with: Anesthesiologist, CRNA and Surgeon  Anesthesia Plan Comments: (Patient consented for risks of anesthesia including but not limited to:  - adverse reactions to medications - damage to teeth, lips or other oral mucosa - sore throat or hoarseness - Damage to heart, brain, lungs or loss of life  Patient voiced understanding.)        Anesthesia Quick Evaluation

## 2017-02-11 NOTE — H&P (Signed)
The History and Physical notes are on paper, have been signed, and are to be scanned. The patient remains stable and unchanged from the H&P.   Previous H&P reviewed, patient examined, and there are no changes.  Cheryl Aguirre 02/11/2017 8:07 AM

## 2017-02-11 NOTE — Transfer of Care (Signed)
Immediate Anesthesia Transfer of Care Note  Patient: Cheryl Aguirre  Procedure(s) Performed: Procedure(s) with comments: CATARACT EXTRACTION PHACO AND INTRAOCULAR LENS PLACEMENT (IOC) (Left) - [place] 1:02.8 AP% 13.7 CDE 8.58 FLUID PACK LOT # [id number]  Patient Location: PACU  Anesthesia Type:MAC  Level of Consciousness: awake, alert  and oriented  Airway & Oxygen Therapy: Patient Spontanous Breathing  Post-op Assessment: Report given to RN and Post -op Vital signs reviewed and stable  Post vital signs: Reviewed and stable  Last Vitals:  Vitals:   02/11/17 0911 02/11/17 0914  BP: 122/69 122/69  Pulse: 66 70  Resp: 18 16  Temp: 37 C 37 C    Last Pain:  Vitals:   02/11/17 0914  TempSrc: Oral         Complications: No apparent anesthesia complications

## 2017-02-11 NOTE — Discharge Instructions (Signed)
Eye Surgery Discharge Instructions  Expect mild scratchy sensation or mild soreness. DO NOT RUB YOUR EYE!  The day of surgery:  Minimal physical activity, but bed rest is not required  No reading, computer work, or close hand work  No bending, lifting, or straining.  May watch TV  For 24 hours:  No driving, legal decisions, or alcoholic beverages  Safety precautions  Eat anything you prefer: It is better to start with liquids, then soup then solid foods.  _____ Eye patch should be worn until postoperative exam tomorrow.  ____ Solar shield eyeglasses should be worn for comfort in the sunlight/patch while sleeping  Resume all regular medications including aspirin or Coumadin if these were discontinued prior to surgery. You may shower, bathe, shave, or wash your hair. Tylenol may be taken for mild discomfort.  Call your doctor if you experience significant pain, nausea, or vomiting, fever > 101 or other signs of infection. [phone] or [phone] Specific instructions:  Follow-up Information    PORFILIO,WILLIAM LOUIS, MD Follow up on 02/11/2017.   Specialty:  Ophthalmology Why:  3:15 Contact information: [address] [place] [address] [phone]

## 2017-02-11 NOTE — Anesthesia Post-op Follow-up Note (Cosign Needed)
Anesthesia QCDR form completed.        

## 2017-03-04 ENCOUNTER — Encounter: Payer: Self-pay | Admitting: *Deleted

## 2017-03-04 DIAGNOSIS — H2511 Age-related nuclear cataract, right eye: Secondary | ICD-10-CM | POA: Diagnosis not present

## 2017-03-09 NOTE — Discharge Instructions (Signed)
Cataract Surgery, Care After °Refer to this sheet in the next few weeks. These instructions provide you with information about caring for yourself after your procedure. Your health care provider may also give you more specific instructions. Your treatment has been planned according to current medical practices, but problems sometimes occur. Call your health care provider if you have any problems or questions after your procedure. °What can I expect after the procedure? °After the procedure, it is common to have: °· Itching. °· Discomfort. °· Fluid discharge. °· Sensitivity to light and to touch. °· Bruising. °Follow these instructions at home: °Eye Care  °· Check your eye every day for signs of infection. Watch for: °¨ Redness, swelling, or pain. °¨ Fluid, blood, or pus. °¨ Warmth. °¨ Bad smell. °Activity  °· Avoid strenuous activities, such as playing contact sports, for as long as told by your health care provider. °· Do not drive or operate heavy machinery until your health care provider approves. °· Do not bend or lift heavy objects . Bending increases pressure in the eye. You can walk, climb stairs, and do light household chores. °· Ask your health care provider when you can return to work. If you work in a dusty environment, you may be advised to wear protective eyewear for a period of time. °General instructions  °· Take or apply over-the-counter and prescription medicines only as told by your health care provider. This includes eye drops. °· Do not touch or rub your eyes. °· If you were given a protective shield, wear it as told by your health care provider. If you were not given a protective shield, wear sunglasses as told by your health care provider to protect your eyes. °· Keep the area around your eye clean and dry. Avoid swimming or allowing water to hit you directly in the face while showering until told by your health care provider. Keep soap and shampoo out of your eyes. °· Do not put a contact lens  into the affected eye or eyes until your health care provider approves. °· Keep all follow-up visits as told by your health care provider. This is important. °Contact a health care provider if: ° °· You have increased bruising around your eye. °· You have pain that is not helped with medicine. °· You have a fever. °· You have redness, swelling, or pain in your eye. °· You have fluid, blood, or pus coming from your incision. °· Your vision gets worse. °Get help right away if: °· You have sudden vision loss. °This information is not intended to replace advice given to you by your health care provider. Make sure you discuss any questions you have with your health care provider. °Document Released: 04/17/2005 Document Revised: 02/06/2016 Document Reviewed: 08/08/2015 °Elsevier Interactive Patient Education © 2017 [employer] ° ° ° ° °General Anesthesia, Adult, Care After °These instructions provide you with information about caring for yourself after your procedure. Your health care provider may also give you more specific instructions. Your treatment has been planned according to current medical practices, but problems sometimes occur. Call your health care provider if you have any problems or questions after your procedure. °What can I expect after the procedure? °After the procedure, it is common to have: °· Vomiting. °· A sore throat. °· Mental slowness. °It is common to feel: °· Nauseous. °· Cold or shivery. °· Sleepy. °· Tired. °· Sore or achy, even in parts of your body where you did not have surgery. °Follow these instructions at   home: °For at least 24 hours after the procedure:  °· Do not: °¨ Participate in activities where you could fall or become injured. °¨ Drive. °¨ Use heavy machinery. °¨ Drink alcohol. °¨ Take sleeping pills or medicines that cause drowsiness. °¨ Make important decisions or sign legal documents. °¨ Take care of children on your own. °· Rest. °Eating and drinking  °· If you vomit, drink  water, juice, or soup when you can drink without vomiting. °· Drink enough fluid to keep your urine clear or pale yellow. °· Make sure you have little or no nausea before eating solid foods. °· Follow the diet recommended by your health care provider. °General instructions  °· Have a responsible adult stay with you until you are awake and alert. °· Return to your normal activities as told by your health care provider. Ask your health care provider what activities are safe for you. °· Take over-the-counter and prescription medicines only as told by your health care provider. °· If you smoke, do not smoke without supervision. °· Keep all follow-up visits as told by your health care provider. This is important. °Contact a health care provider if: °· You continue to have nausea or vomiting at home, and medicines are not helpful. °· You cannot drink fluids or start eating again. °· You cannot urinate after 8-12 hours. °· You develop a skin rash. °· You have fever. °· You have increasing redness at the site of your procedure. °Get help right away if: °· You have difficulty breathing. °· You have chest pain. °· You have unexpected bleeding. °· You feel that you are having a life-threatening or urgent problem. °This information is not intended to replace advice given to you by your health care provider. Make sure you discuss any questions you have with your health care provider. °Document Released: 01/04/2001 Document Revised: 03/02/2016 Document Reviewed: 09/12/2015 °Elsevier Interactive Patient Education © 2017 [employer] ° °

## 2017-03-10 ENCOUNTER — Ambulatory Visit: Payer: Medicare Other | Admitting: Anesthesiology

## 2017-03-10 ENCOUNTER — Ambulatory Visit
Admission: RE | Admit: 2017-03-10 | Discharge: 2017-03-10 | Disposition: A | Discharge status: Home / Self Care | Payer: Medicare Other | Source: Ambulatory Visit | Attending: Ophthalmology | Admitting: Ophthalmology

## 2017-03-10 ENCOUNTER — Encounter
Admission: RE | Disposition: A | Discharge status: Home / Self Care | Payer: Self-pay | Source: Ambulatory Visit | Attending: Ophthalmology

## 2017-03-10 DIAGNOSIS — F329 Major depressive disorder, single episode, unspecified: Secondary | ICD-10-CM | POA: Insufficient documentation

## 2017-03-10 DIAGNOSIS — F17200 Nicotine dependence, unspecified, uncomplicated: Secondary | ICD-10-CM | POA: Diagnosis not present

## 2017-03-10 DIAGNOSIS — K219 Gastro-esophageal reflux disease without esophagitis: Secondary | ICD-10-CM | POA: Diagnosis not present

## 2017-03-10 DIAGNOSIS — M810 Age-related osteoporosis without current pathological fracture: Secondary | ICD-10-CM | POA: Insufficient documentation

## 2017-03-10 DIAGNOSIS — H2511 Age-related nuclear cataract, right eye: Secondary | ICD-10-CM | POA: Diagnosis not present

## 2017-03-10 DIAGNOSIS — Z880 Allergy status to penicillin: Secondary | ICD-10-CM | POA: Diagnosis not present

## 2017-03-10 DIAGNOSIS — G43909 Migraine, unspecified, not intractable, without status migrainosus: Secondary | ICD-10-CM | POA: Insufficient documentation

## 2017-03-10 DIAGNOSIS — Z86010 Personal history of colonic polyps: Secondary | ICD-10-CM | POA: Insufficient documentation

## 2017-03-10 DIAGNOSIS — M1990 Unspecified osteoarthritis, unspecified site: Secondary | ICD-10-CM | POA: Diagnosis not present

## 2017-03-10 HISTORY — PX: CATARACT EXTRACTION W/PHACO: SHX586

## 2017-03-10 HISTORY — DX: Other specified disorders of bone density and structure, unspecified site: M85.80

## 2017-03-10 SURGERY — PHACOEMULSIFICATION, CATARACT, WITH IOL INSERTION
Anesthesia: Monitor Anesthesia Care | Laterality: Right | Wound class: Clean

## 2017-03-10 MED ORDER — ACETAMINOPHEN 160 MG/5ML PO SOLN: 325.0000 mg | ORAL | Status: DC | PRN

## 2017-03-10 MED ORDER — LIDOCAINE HCL (PF) 2 % IJ SOLN
INTRAOCULAR | Status: DC | PRN
  Administered 2017-03-10: 1 mL via INTRAOCULAR

## 2017-03-10 MED ORDER — MIDAZOLAM HCL 2 MG/2ML IJ SOLN
INTRAMUSCULAR | Status: DC | PRN
  Administered 2017-03-10 (×2): 1 mg via INTRAVENOUS

## 2017-03-10 MED ORDER — ARMC OPHTHALMIC DILATING DROPS
1.0000 "application " | OPHTHALMIC | Status: DC | PRN
  Administered 2017-03-10 (×3): 1 via OPHTHALMIC

## 2017-03-10 MED ORDER — MOXIFLOXACIN HCL 0.5 % OP SOLN
1.0000 [drp] | OPHTHALMIC | Status: DC | PRN
  Administered 2017-03-10 (×3): 1 [drp] via OPHTHALMIC

## 2017-03-10 MED ORDER — NA HYALUR & NA CHOND-NA HYALUR 0.4-0.35 ML IO KIT
PACK | INTRAOCULAR | Status: DC | PRN
  Administered 2017-03-10: 1 mL via INTRAOCULAR

## 2017-03-10 MED ORDER — BRIMONIDINE TARTRATE-TIMOLOL 0.2-0.5 % OP SOLN
OPHTHALMIC | Status: DC | PRN
  Administered 2017-03-10: 1 [drp] via OPHTHALMIC

## 2017-03-10 MED ORDER — MOXIFLOXACIN HCL 0.5 % OP SOLN
OPHTHALMIC | Status: DC | PRN
  Administered 2017-03-10: 0.2 mL

## 2017-03-10 MED ORDER — LACTATED RINGERS IV SOLN: INTRAVENOUS | Status: DC

## 2017-03-10 MED ORDER — ACETAMINOPHEN 325 MG PO TABS: 325.0000 mg | ORAL_TABLET | ORAL | Status: DC | PRN

## 2017-03-10 MED ORDER — FENTANYL CITRATE (PF) 100 MCG/2ML IJ SOLN
INTRAMUSCULAR | Status: DC | PRN
  Administered 2017-03-10 (×2): 50 ug via INTRAVENOUS

## 2017-03-10 MED ORDER — EPINEPHRINE PF 1 MG/ML IJ SOLN
INTRAOCULAR | Status: DC | PRN
  Administered 2017-03-10: 54 mL via OPHTHALMIC

## 2017-03-10 SURGICAL SUPPLY — 27 items
CANNULA ANT/CHMB 27GA (MISCELLANEOUS) ×3 IMPLANT
CARTRIDGE ABBOTT (MISCELLANEOUS) IMPLANT
GLOVE SURG LX 7.5 STRW (GLOVE) ×2
GLOVE SURG LX STRL 7.5 STRW (GLOVE) ×1 IMPLANT
GLOVE SURG TRIUMPH 8.0 PF LTX (GLOVE) ×3 IMPLANT
GOWN STRL REUS W/ TWL LRG LVL3 (GOWN DISPOSABLE) ×2 IMPLANT
GOWN STRL REUS W/TWL LRG LVL3 (GOWN DISPOSABLE) ×4
LENS IOL SYMFON TORIC 150 22.0 ×1 IMPLANT
LENS IOL SYMFONY TORIC 22.0 ×2 IMPLANT
LENS IOL SYMFONY TRC 150 22.0 ×1 IMPLANT
MARKER SKIN DUAL TIP RULER LAB (MISCELLANEOUS) ×3 IMPLANT
NDL RETROBULBAR .5 NSTRL (NEEDLE) IMPLANT
NEEDLE FILTER BLUNT 18X 1/2SAF (NEEDLE) ×2
NEEDLE FILTER BLUNT 18X1 1/2 (NEEDLE) ×1 IMPLANT
PACK CATARACT BRASINGTON (MISCELLANEOUS) ×3 IMPLANT
PACK EYE AFTER SURG (MISCELLANEOUS) ×3 IMPLANT
PACK OPTHALMIC (MISCELLANEOUS) ×3 IMPLANT
RING MALYGIN 7.0 (MISCELLANEOUS) IMPLANT
SUT ETHILON 10-0 CS-B-6CS-B-6 (SUTURE)
SUT VICRYL  9 0 (SUTURE)
SUT VICRYL 9 0 (SUTURE) IMPLANT
SUTURE EHLN 10-0 CS-B-6CS-B-6 (SUTURE) IMPLANT
SYR 3ML LL SCALE MARK (SYRINGE) ×3 IMPLANT
SYR 5ML LL (SYRINGE) ×3 IMPLANT
SYR TB 1ML LUER SLIP (SYRINGE) ×3 IMPLANT
WATER STERILE IRR 250ML POUR (IV SOLUTION) ×3 IMPLANT
WIPE NON LINTING 3.25X3.25 (MISCELLANEOUS) ×3 IMPLANT

## 2017-03-10 NOTE — Anesthesia Preprocedure Evaluation (Signed)
Anesthesia Evaluation  Patient identified by MRN, date of birth, ID band Patient awake    Reviewed: Allergy & Precautions, H&P , NPO status , Patient's Chart, lab work & pertinent test results  Airway Mallampati: II  TM Distance: >3 FB Neck ROM: full    Dental no notable dental hx.    Pulmonary Current Smoker,    Pulmonary exam normal        Cardiovascular Normal cardiovascular exam     Neuro/Psych PSYCHIATRIC DISORDERS    GI/Hepatic GERD  ,  Endo/Other    Renal/GU      Musculoskeletal   Abdominal   Peds  Hematology   Anesthesia Other Findings   Reproductive/Obstetrics                             Anesthesia Physical Anesthesia Plan  ASA: II  Anesthesia Plan: MAC   Post-op Pain Management:    Induction:   Airway Management Planned:   Additional Equipment:   Intra-op Plan:   Post-operative Plan:   Informed Consent: I have reviewed the patients History and Physical, chart, labs and discussed the procedure including the risks, benefits and alternatives for the proposed anesthesia with the patient or authorized representative who has indicated his/her understanding and acceptance.     Plan Discussed with:   Anesthesia Plan Comments:         Anesthesia Quick Evaluation

## 2017-03-10 NOTE — Op Note (Signed)
LOCATION:  [place]   PREOPERATIVE DIAGNOSIS:  Nuclear sclerotic cataract of the right eye.  H25.11   POSTOPERATIVE DIAGNOSIS:  Nuclear sclerotic cataract of the right eye.   PROCEDURE:  Phacoemulsification with Toric posterior chamber intraocular lens placement of the right eye.   LENS:   Implant Name Type Inv. Item Serial No. Manufacturer Lot No. LRB No. Used  [id number] Toric symfony lens     [id number] [facility]   Right 1     22.0 D Toric intraocular lens with 1.50 diopters of cylindrical power with axis orientation at 82 degrees.   ULTRASOUND TIME: 20 % of 1 minutes, 4 seconds.  CDE 12.9   SURGEON:  Wyonia Hough, MD   ANESTHESIA: Topical with tetracaine drops and 2% Xylocaine jelly, augmented with 1% preservative-free intracameral lidocaine. .   COMPLICATIONS:  None.   DESCRIPTION OF PROCEDURE:  The patient was identified in the holding room and transported to the operating suite and placed in the supine position under the operating microscope.  The right eye was identified as the operative eye, and it was prepped and draped in the usual sterile ophthalmic fashion.    A clear-corneal paracentesis incision was made at the 12:00 position.  0.5 ml of preservative-free 1% lidocaine was injected into the anterior chamber. The anterior chamber was filled with Viscoat.  A 2.4 millimeter near clear corneal incision was then made at the 9:00 position.  A cystotome and capsulorrhexis forceps were then used to make a curvilinear capsulorrhexis.  Hydrodissection and hydrodelineation were then performed using balanced salt solution.   Phacoemulsification was then used in stop and chop fashion to remove the lens, nucleus and epinucleus.  The remaining cortex was aspirated using the irrigation and aspiration handpiece.  Provisc viscoelastic was then placed into the capsular bag to distend it for lens placement.  The Verion digital marker was used to align the implant at the  intended axis.   A Toric lens was then injected into the capsular bag.  It was rotated clockwise until the axis marks on the lens were approximately 15 degrees in the counterclockwise direction to the intended alignment.  The viscoelastic was aspirated from the eye using the irrigation aspiration handpiece.  Then, a Koch spatula through the sideport incision was used to rotate the lens in a clockwise direction until the axis markings of the intraocular lens were lined up with the Verion alignment.  Balanced salt solution was then used to hydrate the wounds. Vigamox 0.2 ml of a 1mg  per ml solution was injected into the anterior chamber for a dose of 0.2 mg of intracameral antibiotic at the completion of the case.    The eye was noted to have a physiologic pressure and there was no wound leak noted.   Timolol and Brimonidine drops were applied to the eye.  The patient was taken to the recovery room in stable condition having had no complications of anesthesia or surgery.  [name] 03/10/2017, 9:39 AM

## 2017-03-10 NOTE — Anesthesia Postprocedure Evaluation (Signed)
Anesthesia Post Note  Patient: Cheryl Aguirre  Procedure(s) Performed: Procedure(s) (LRB): CATARACT EXTRACTION PHACO AND INTRAOCULAR LENS PLACEMENT ([facility]) Right symfony toric lens (Right)  Patient location during evaluation: PACU Anesthesia Type: MAC Level of consciousness: awake and alert and oriented Pain management: satisfactory to patient Vital Signs Assessment: post-procedure vital signs reviewed and stable Respiratory status: spontaneous breathing, nonlabored ventilation and respiratory function stable Cardiovascular status: blood pressure returned to baseline and stable Postop Assessment: Adequate PO intake and No signs of nausea or vomiting Anesthetic complications: no    Raliegh Ip

## 2017-03-10 NOTE — H&P (Signed)
The History and Physical notes are on paper, have been signed, and are to be scanned. The patient remains stable and unchanged from the H&P.   Previous H&P reviewed, patient examined, and there are no changes.  [name] 03/10/2017 9:09 AM

## 2017-03-10 NOTE — Transfer of Care (Signed)
Immediate Anesthesia Transfer of Care Note  Patient: Cheryl Aguirre  Procedure(s) Performed: Procedure(s) with comments: CATARACT EXTRACTION PHACO AND INTRAOCULAR LENS PLACEMENT ([facility]) Right symfony toric lens (Right) - symfony toric lens prefers early  Patient Location: PACU  Anesthesia Type: MAC  Level of Consciousness: awake, alert  and patient cooperative  Airway and Oxygen Therapy: Patient Spontanous Breathing and Patient connected to supplemental oxygen  Post-op Assessment: Post-op Vital signs reviewed, Patient's Cardiovascular Status Stable, Respiratory Function Stable, Patent Airway and No signs of Nausea or vomiting  Post-op Vital Signs: Reviewed and stable  Complications: No apparent anesthesia complications

## 2017-03-10 NOTE — Anesthesia Procedure Notes (Signed)
Procedure Name: MAC Performed by: Lind Guest Pre-anesthesia Checklist: Patient identified, Emergency Drugs available, Suction available, Patient being monitored and Timeout performed Patient Re-evaluated:Patient Re-evaluated prior to inductionOxygen Delivery Method: Nasal cannula

## 2017-03-11 ENCOUNTER — Encounter: Payer: Self-pay | Admitting: Ophthalmology

## 2017-04-02 ENCOUNTER — Ambulatory Visit (INDEPENDENT_AMBULATORY_CARE_PROVIDER_SITE_OTHER): Payer: Medicare Other | Admitting: Adult Health

## 2017-04-02 ENCOUNTER — Encounter: Payer: Self-pay | Admitting: Adult Health

## 2017-04-02 VITALS — BP 124/81 | Temp 98.1°F | Ht 63.0 in | Wt 129.3 lb

## 2017-04-02 DIAGNOSIS — M5431 Sciatica, right side: Secondary | ICD-10-CM | POA: Diagnosis not present

## 2017-04-02 MED ORDER — CYCLOBENZAPRINE HCL 10 MG PO TABS: 10.0000 mg | ORAL_TABLET | Freq: Every day | ORAL | 0 refills | Status: DC

## 2017-04-02 MED ORDER — PREDNISONE 10 MG PO TABS: ORAL_TABLET | ORAL | 0 refills | Status: DC

## 2017-04-02 NOTE — Progress Notes (Signed)
Subjective:    Patient ID: Cheryl Aguirre, female    DOB: Sep 12, 1946, 71 y.o.   MRN: [id number]  HPI  71 year old female who  has a past medical history of Anxiety; Arthritis; Chronic headaches; Colon polyps; Depression; GERD (gastroesophageal reflux disease); IBS (irritable bowel syndrome); Insomnia; Osteopenia; and Tobacco abuse. She presents to the office today for an acute issue of right lower back pain with radiating pain down the outside of her right leg. The pain is a " burning pain and it catches". Pain is worse with movement and weight bearing.   She has been using Motrin, Flexeril and ice without improvement    Review of Systems See HPI   Past Medical History:  Diagnosis Date  . Anxiety   . Arthritis    left hip  . Chronic headaches   . Colon polyps   . Depression   . GERD (gastroesophageal reflux disease)   . IBS (irritable bowel syndrome)   . Insomnia   . Osteopenia   . Tobacco abuse     Social History   Social History  . Marital status: Married    Spouse name: N/A  . Number of children: 1  . Years of education: N/A   Occupational History  . Unemployed Unemployed   Social History Main Topics  . Smoking status: Current Every Day Smoker    Packs/day: 1.00    Types: Cigarettes  . Smokeless tobacco: Never Used  . Alcohol use 1.2 oz/week    1 Glasses of wine, 1 Cans of beer per week     Comment:    . Drug use: No  . Sexual activity: Yes   Other Topics Concern  . Not on file   Social History Narrative   Daily Caffeine Use:  2 daily    Past Surgical History:  Procedure Laterality Date  . ABDOMINAL HYSTERECTOMY     bleeding, fibroid, endometriosis (total)  . CATARACT EXTRACTION W/PHACO Left 02/11/2017   Procedure: CATARACT EXTRACTION PHACO AND INTRAOCULAR LENS PLACEMENT (I[facility]);  Surgeon: Leandrew Koyanagi, MD;  Location: [facility];  Service: Ophthalmology;  Laterality: Left;  [place] 1:02.8 AP% 13.7 CDE 8.58 FLUID PACK LOT # [id number]  . CATARACT  EXTRACTION W/PHACO Right 03/10/2017   Procedure: CATARACT EXTRACTION PHACO AND INTRAOCULAR LENS PLACEMENT ([facility]) Right symfony toric lens;  Surgeon: Leandrew Koyanagi, MD;  Location: [facility];  Service: Ophthalmology;  Laterality: Right;  symfony toric lens prefers early  . CT of chest  10/09   bronchiectasis  . LAPAROSCOPY  1984    Family History  Problem Relation Age of Onset  . Heart disease Other   . Cancer Mother        pancreatic, liver  . Cancer Father        colon  . Colon cancer Father 80  . Thyroid disease Sister   . Colon polyps Sister   . Irritable bowel syndrome Sister   . Breast cancer Sister   . Cancer Sister   . Cancer Maternal Aunt        breast  . Breast cancer Maternal Aunt   . Cancer Paternal Grandmother        thyroid  . Miscarriages / [place] Cousin   . Cancer Cousin        breast  . Diabetes Cousin   . Thyroid disease Sister     Allergies  Allergen Reactions  . Adhesive [Tape]     Tears skin  . Alendronate Sodium  REACTION: muscle, joint pain, indigestion  . Penicillins   . Varenicline Tartrate Nausea Only       . Doxycycline Nausea And Vomiting and Rash         Current Outpatient Prescriptions on File Prior to Visit  Medication Sig Dispense Refill  . Calcium Citrate-Vitamin D (CITRACAL/VITAMIN D) 250-200 MG-UNIT TABS Take 4 tablets by mouth daily.     . cetirizine (ZYRTEC) 10 MG tablet Take 10 mg by mouth daily.      . Cholecalciferol (VITAMIN D PO) Take by mouth daily.    . cyclobenzaprine (FLEXERIL) 10 MG tablet Take 10 mg by mouth as needed.    Marland Kitchen escitalopram (LEXAPRO) 20 MG tablet Take 1 tablet (20 mg total) by mouth daily. 90 tablet 3  . fish oil-omega-3 fatty acids 1000 MG capsule Take 2 g by mouth daily.     . Lactobacillus (ACIDOPHILUS) 100 MG CAPS Take 1 capsule by mouth daily.      . Magnesium 250 MG TABS Take 1 tablet by mouth daily.    . Melatonin 1 MG TABS 5 mg at bedtime. As directed at bedtime.     .  montelukast (SINGULAIR) 10 MG tablet Take 1 tablet (10 mg total) by mouth at bedtime. 90 tablet 3  . Multiple Vitamin (MULTIVITAMIN) tablet Take 1 tablet by mouth daily.      Marland Kitchen Phenylephrine-Bromphen-DM (PHENYLEPHRINE COMPLEX PO) Take 1 tablet up to every 4 hours as needed     . SUMAtriptan (IMITREX) 50 MG tablet Take 50 mg by mouth daily as needed. May repeat in 2 hours if headache persists or recurs.      No current facility-administered medications on file prior to visit.     BP 124/81 (BP Location: Left Arm, Patient Position: Sitting, Cuff Size: Small)   Temp 98.1 F (36.7 C) (Oral)   Ht 5\' 3"  (1.6 m)   Wt 129 lb 4.8 oz (58.7 kg)   BMI 22.90 kg/m       Objective:   Physical Exam  Constitutional: She is oriented to person, place, and time. She appears well-developed and well-nourished. No distress.  Cardiovascular: Normal rate, regular rhythm, normal heart sounds and intact distal pulses.  Exam reveals no gallop and no friction rub.   No murmur heard. Pulmonary/Chest: Effort normal and breath sounds normal. No respiratory distress. She has no wheezes. She has no rales. She exhibits no tenderness.  Musculoskeletal: Normal range of motion. She exhibits no edema, tenderness or deformity.  No additional pain with palpation. + 2 pedal pulses and good cap refill.   Neurological: She is alert and oriented to person, place, and time.  Skin: Skin is warm and dry. No rash noted. She is not diaphoretic. No erythema. No pallor.  Psychiatric: She has a normal mood and affect. Her behavior is normal. Judgment and thought content normal.  Vitals reviewed.     Assessment & Plan:  1. Sciatica of right side - exam c/w sciatica  - predniSONE (DELTASONE) 10 MG tablet; 40 mg x 3 days, 20 mg x 3 days, 10 mg x 3 days  Dispense: 21 tablet; Refill: 0 - cyclobenzaprine (FLEXERIL) 10 MG tablet; Take 1 tablet (10 mg total) by mouth at bedtime.  Dispense: 5 tablet; Refill: 0  Dorothyann Peng, NP

## 2017-10-12 DIAGNOSIS — C801 Malignant (primary) neoplasm, unspecified: Secondary | ICD-10-CM

## 2017-10-12 HISTORY — DX: Malignant (primary) neoplasm, unspecified: C80.1

## 2017-10-12 HISTORY — PX: BREAST SURGERY: SHX581

## 2017-11-01 DIAGNOSIS — Z961 Presence of intraocular lens: Secondary | ICD-10-CM | POA: Diagnosis not present

## 2017-11-20 ENCOUNTER — Telehealth: Payer: Self-pay | Admitting: Family Medicine

## 2017-11-20 DIAGNOSIS — R739 Hyperglycemia, unspecified: Secondary | ICD-10-CM

## 2017-11-20 DIAGNOSIS — E7800 Pure hypercholesterolemia, unspecified: Secondary | ICD-10-CM

## 2017-11-20 DIAGNOSIS — Z0000 Encounter for general adult medical examination without abnormal findings: Secondary | ICD-10-CM

## 2017-11-20 DIAGNOSIS — Z1329 Encounter for screening for other suspected endocrine disorder: Secondary | ICD-10-CM

## 2017-11-20 DIAGNOSIS — G4700 Insomnia, unspecified: Secondary | ICD-10-CM

## 2017-11-20 NOTE — Telephone Encounter (Signed)
-----   Message from Eustace Pen, LPN sent at 12/14/8249 11:09 PM EST ----- Regarding: Labs 2/14 Lab orders needed. Thank you.  Insurance:  Medicare - traditional

## 2017-11-26 ENCOUNTER — Ambulatory Visit (INDEPENDENT_AMBULATORY_CARE_PROVIDER_SITE_OTHER): Payer: Medicare Other

## 2017-11-26 VITALS — BP 118/80 | HR 82 | Temp 97.9°F | Ht 63.0 in | Wt 122.2 lb

## 2017-11-26 DIAGNOSIS — R739 Hyperglycemia, unspecified: Secondary | ICD-10-CM

## 2017-11-26 DIAGNOSIS — Z0000 Encounter for general adult medical examination without abnormal findings: Secondary | ICD-10-CM

## 2017-11-26 DIAGNOSIS — Z1329 Encounter for screening for other suspected endocrine disorder: Secondary | ICD-10-CM

## 2017-11-26 DIAGNOSIS — E7800 Pure hypercholesterolemia, unspecified: Secondary | ICD-10-CM

## 2017-11-26 LAB — CBC WITH DIFFERENTIAL/PLATELET
BASOS ABS: 0.1 10*3/uL (ref 0.0–0.1)
Basophils Relative: 0.7 % (ref 0.0–3.0)
Eosinophils Absolute: 0.2 10*3/uL (ref 0.0–0.7)
Eosinophils Relative: 2.6 % (ref 0.0–5.0)
HCT: 44.5 % (ref 36.0–46.0)
Hemoglobin: 15 g/dL (ref 12.0–15.0)
LYMPHS ABS: 1.5 10*3/uL (ref 0.7–4.0)
Lymphocytes Relative: 19.3 % (ref 12.0–46.0)
MCHC: 33.8 g/dL (ref 30.0–36.0)
MCV: 94 fl (ref 78.0–100.0)
MONO ABS: 0.8 10*3/uL (ref 0.1–1.0)
Monocytes Relative: 10.2 % (ref 3.0–12.0)
NEUTROS ABS: 5.2 10*3/uL (ref 1.4–7.7)
NEUTROS PCT: 67.2 % (ref 43.0–77.0)
PLATELETS: 273 10*3/uL (ref 150.0–400.0)
RBC: 4.73 Mil/uL (ref 3.87–5.11)
RDW: 13 % (ref 11.5–15.5)
WBC: 7.7 10*3/uL (ref 4.0–10.5)

## 2017-11-26 LAB — COMPREHENSIVE METABOLIC PANEL
ALT: 12 U/L (ref 0–35)
AST: 22 U/L (ref 0–37)
Albumin: 4.3 g/dL (ref 3.5–5.2)
Alkaline Phosphatase: 82 U/L (ref 39–117)
BILIRUBIN TOTAL: 0.6 mg/dL (ref 0.2–1.2)
BUN: 15 mg/dL (ref 6–23)
CALCIUM: 9.5 mg/dL (ref 8.4–10.5)
CO2: 32 meq/L (ref 19–32)
CREATININE: 0.83 mg/dL (ref 0.40–1.20)
Chloride: 101 mEq/L (ref 96–112)
GFR: 72.17 mL/min (ref >60.00)
Glucose, Bld: 104 mg/dL — ABNORMAL HIGH (ref 70–99)
Potassium: 4.2 mEq/L (ref 3.5–5.1)
SODIUM: 138 meq/L (ref 135–145)
Total Protein: 7 g/dL (ref 6.0–8.3)

## 2017-11-26 LAB — HEMOGLOBIN A1C: Hgb A1c MFr Bld: 5.9 % (ref 4.6–6.5)

## 2017-11-26 LAB — LIPID PANEL
Cholesterol: 201 mg/dL — ABNORMAL HIGH (ref 0–200)
HDL: 75.1 mg/dL (ref >39.00)
LDL Cholesterol: 108 mg/dL — ABNORMAL HIGH (ref 0–99)
NONHDL: 126.22
Total CHOL/HDL Ratio: 3
Triglycerides: 93 mg/dL (ref 0.0–149.0)
VLDL: 18.6 mg/dL (ref 0.0–40.0)

## 2017-11-26 LAB — TSH: TSH: 4.25 u[IU]/mL (ref 0.35–4.50)

## 2017-11-26 NOTE — Progress Notes (Signed)
PCP notes:   Health maintenance:  No gaps identified.  Abnormal screenings:   None  Patient concerns:   Pt reports increased fatigue over the past two weeks.   Nurse concerns:  None  Next PCP appt:   11/30/17 @ 0930  I reviewed health advisor's note, was available for consultation, and agree with documentation and plan. Loura Pardon MD

## 2017-11-26 NOTE — Progress Notes (Signed)
Pre visit review using our clinic review tool, if applicable. No additional management support is needed unless otherwise documented below in the visit note. 

## 2017-11-26 NOTE — Patient Instructions (Signed)
Cheryl Aguirre , Thank you for taking time to come for your Medicare Wellness Visit. I appreciate your ongoing commitment to your health goals. Please review the following plan we discussed and let me know if I can assist you in the future.   These are the goals we discussed: Goals    . Increase physical activity     When weather permits, I will begin walking 60 minutes 3 days per week.        This is a list of the screening recommended for you and due dates:  Health Maintenance  Topic Date Due  . Mammogram  01/08/2018  . Colon Cancer Screening  08/26/2019  . Tetanus Vaccine  07/24/2020  . Flu Shot  Completed  . DEXA scan (bone density measurement)  Completed  .  Hepatitis C: One time screening is recommended by Center for Disease Control  (CDC) for  adults born from [birth year] through 1964.   Completed  . Pneumonia vaccines  Completed   Preventive Care for Adults  A healthy lifestyle and preventive care can promote health and wellness. Preventive health guidelines for adults include the following key practices.  . A routine yearly physical is a good way to check with your health care provider about your health and preventive screening. It is a chance to share any concerns and updates on your health and to receive a thorough exam.  . Visit your dentist for a routine exam and preventive care every 6 months. Brush your teeth twice a day and floss once a day. Good oral hygiene prevents tooth decay and gum disease.  . The frequency of eye exams is based on your age, health, family medical history, use  of contact lenses, and other factors. Follow your health care provider's recommendations for frequency of eye exams.  . Eat a healthy diet. Foods like vegetables, fruits, whole grains, low-fat dairy products, and lean protein foods contain the nutrients you need without too many calories. Decrease your intake of foods high in solid fats, added sugars, and salt. Eat the right amount of calories  for you. Get information about a proper diet from your health care provider, if necessary.  . Regular physical exercise is one of the most important things you can do for your health. Most adults should get at least 150 minutes of moderate-intensity exercise (any activity that increases your heart rate and causes you to sweat) each week. In addition, most adults need muscle-strengthening exercises on 2 or more days a week.  Silver Sneakers may be a benefit available to you. To determine eligibility, you may visit the website: www.silversneakers.com or contact program at 4795662809 Mon-Fri between 8AM-8PM.   . Maintain a healthy weight. The body mass index (BMI) is a screening tool to identify possible weight problems. It provides an estimate of body fat based on height and weight. Your health care provider can find your BMI and can help you achieve or maintain a healthy weight.   For adults 20 years and older: ? A BMI below 18.5 is considered underweight. ? A BMI of 18.5 to 24.9 is normal. ? A BMI of 25 to 29.9 is considered overweight. ? A BMI of 30 and above is considered obese.   . Maintain normal blood lipids and cholesterol levels by exercising and minimizing your intake of saturated fat. Eat a balanced diet with plenty of fruit and vegetables. Blood tests for lipids and cholesterol should begin at age 14 and be repeated every 5 years. If  your lipid or cholesterol levels are high, you are over 50, or you are at high risk for heart disease, you may need your cholesterol levels checked more frequently. Ongoing high lipid and cholesterol levels should be treated with medicines if diet and exercise are not working.  . If you smoke, find out from your health care provider how to quit. If you do not use tobacco, please do not start.  . If you choose to drink alcohol, please do not consume more than 2 drinks per day. One drink is considered to be 12 ounces (355 mL) of beer, 5 ounces (148 mL) of  wine, or 1.5 ounces (44 mL) of liquor.  . If you are 39-16 years old, ask your health care provider if you should take aspirin to prevent strokes.  . Use sunscreen. Apply sunscreen liberally and repeatedly throughout the day. You should seek shade when your shadow is shorter than you. Protect yourself by wearing long sleeves, pants, a wide-brimmed hat, and sunglasses year round, whenever you are outdoors.  . Once a month, do a whole body skin exam, using a mirror to look at the skin on your back. Tell your health care provider of new moles, moles that have irregular borders, moles that are larger than a pencil eraser, or moles that have changed in shape or color.

## 2017-11-26 NOTE — Progress Notes (Signed)
Subjective:   Cheryl Aguirre is a 72 y.o. female who presents for Medicare Annual (Subsequent) preventive examination.  Review of Systems:  N/A Cardiac Risk Factors include: advanced age (>17men, >42 women);smoking/ tobacco exposure;dyslipidemia     Objective:     Vitals: BP 118/80 (BP Location: Left Arm, Patient Position: Sitting, Cuff Size: Normal)   Pulse 82   Temp 97.9 F (36.6 C) (Oral)   Ht 5\' 3"  (1.6 m) Comment: no shoes  Wt 122 lb 4 oz (55.5 kg)   SpO2 95%   BMI 21.66 kg/m   Body mass index is 21.66 kg/m.  Advanced Directives 11/26/2017 03/10/2017 11/20/2016 08/11/2016  Does Patient Have a Medical Advance Directive? Yes Yes Yes Yes  Type of Paramedic of [place];Living will [place];Living will Living will;Healthcare Power of [place];Living will  Copy of [place] in Chart? Yes Yes No - copy requested No - copy requested    Tobacco Social History   Tobacco Use  Smoking Status Current Every Day Smoker  . Packs/day: 1.00  . Types: Cigarettes  Smokeless Tobacco Never Used     Ready to quit: No Counseling given: No   Clinical Intake:  Pre-visit preparation completed: Yes  Pain : No/denies pain Pain Score: 0-No pain     Nutritional Status: BMI of 19-24  Normal Nutritional Risks: None Diabetes: No  How often do you need to have someone help you when you read instructions, pamphlets, or other written materials from your doctor or pharmacy?: 1 - Never What is the last grade level you completed in school?: 12th grade + 1 yr college  Interpreter Needed?: No  Comments: pt lives with spouse Information entered by :: LPinson, LPN  Past Medical History:  Diagnosis Date  . Anxiety   . Arthritis    left hip  . Chronic headaches   . Colon polyps   . Depression   . GERD (gastroesophageal reflux disease)   . IBS (irritable bowel syndrome)   . Insomnia   .  Osteopenia   . Tobacco abuse    Past Surgical History:  Procedure Laterality Date  . ABDOMINAL HYSTERECTOMY     bleeding, fibroid, endometriosis (total)  . CATARACT EXTRACTION W/PHACO Left 02/11/2017   Procedure: CATARACT EXTRACTION PHACO AND INTRAOCULAR LENS PLACEMENT (IOC);  Surgeon: Leandrew Koyanagi, MD;  Location: [facility];  Service: Ophthalmology;  Laterality: Left;  [place] 1:02.8 AP% 13.7 CDE 8.58 FLUID PACK LOT # [id number] H  . CATARACT EXTRACTION W/PHACO Right 03/10/2017   Procedure: CATARACT EXTRACTION PHACO AND INTRAOCULAR LENS PLACEMENT ([place]) Right symfony toric lens;  Surgeon: Leandrew Koyanagi, MD;  Location: [place];  Service: Ophthalmology;  Laterality: Right;  symfony toric lens prefers early  . CT of chest  10/09   bronchiectasis  . LAPAROSCOPY  1984   Family History  Problem Relation Age of Onset  . Heart disease Other   . Cancer Mother        pancreatic, liver  . Cancer Father        colon  . Colon cancer Father 3  . Thyroid disease Sister   . Colon polyps Sister   . Irritable bowel syndrome Sister   . Breast cancer Sister   . Cancer Sister   . Cancer Maternal Aunt        breast  . Breast cancer Maternal Aunt   . Cancer Paternal Grandmother        thyroid  .  Miscarriages / [place] Cousin   . Cancer Cousin        breast  . Diabetes Cousin   . Thyroid disease Sister    Social History   Socioeconomic History  . Marital status: Married    Spouse name: None  . Number of children: 1  . Years of education: None  . Highest education level: None  Social Needs  . Financial resource strain: None  . Food insecurity - worry: None  . Food insecurity - inability: None  . Transportation needs - medical: None  . Transportation needs - non-medical: None  Occupational History  . Occupation: Merchandiser, retail: UNEMPLOYED  Tobacco Use  . Smoking status: Current Every Day Smoker    Packs/day: 1.00    Types: Cigarettes  . Smokeless  tobacco: Never Used  Substance and Sexual Activity  . Alcohol use: Yes    Alcohol/week: 1.2 oz    Types: 1 Glasses of wine, 1 Cans of beer per week    Comment:    . Drug use: No  . Sexual activity: Yes  Other Topics Concern  . None  Social History Narrative   Daily Caffeine Use:  2 daily    Outpatient Encounter Medications as of 11/26/2017  Medication Sig  . Calcium Citrate-Vitamin D (CITRACAL/VITAMIN D) 250-200 MG-UNIT TABS Take 4 tablets by mouth daily.   . cetirizine (ZYRTEC) 10 MG tablet Take 10 mg by mouth daily.    . Cholecalciferol (VITAMIN D PO) Take by mouth daily.  . cyclobenzaprine (FLEXERIL) 10 MG tablet Take 10 mg by mouth as needed.  . cyclobenzaprine (FLEXERIL) 10 MG tablet Take 1 tablet (10 mg total) by mouth at bedtime.  Marland Kitchen escitalopram (LEXAPRO) 20 MG tablet Take 1 tablet (20 mg total) by mouth daily.  . fish oil-omega-3 fatty acids 1000 MG capsule Take 2 g by mouth daily.   . Lactobacillus (ACIDOPHILUS) 100 MG CAPS Take 1 capsule by mouth daily.    . Magnesium 250 MG TABS Take 1 tablet by mouth daily.  . Melatonin 1 MG TABS 5 mg at bedtime. As directed at bedtime.   . montelukast (SINGULAIR) 10 MG tablet Take 1 tablet (10 mg total) by mouth at bedtime.  . Multiple Vitamin (MULTIVITAMIN) tablet Take 1 tablet by mouth daily.    Marland Kitchen Phenylephrine-Bromphen-DM (PHENYLEPHRINE COMPLEX PO) Take 1 tablet up to every 4 hours as needed   . SUMAtriptan (IMITREX) 50 MG tablet Take 50 mg by mouth daily as needed. May repeat in 2 hours if headache persists or recurs.   . [DISCONTINUED] predniSONE (DELTASONE) 10 MG tablet 40 mg x 3 days, 20 mg x 3 days, 10 mg x 3 days   No facility-administered encounter medications on file as of 11/26/2017.     Activities of Daily Living In your present state of health, do you have any difficulty performing the following activities: 11/26/2017 03/10/2017  Hearing? N N  Vision? N N  Difficulty concentrating or making decisions? N N  Walking or  climbing stairs? N N  Dressing or bathing? N N  Doing errands, shopping? N -  Preparing Food and eating ? N -  Using the Toilet? N -  In the past six months, have you accidently leaked urine? N -  Do you have problems with loss of bowel control? N -  Managing your Medications? N -  Managing your Finances? N -  Housekeeping or managing your Housekeeping? N -  Some recent data might be  hidden    Patient Care Team: Tower, Wynelle Fanny, MD as PCP - General Leandrew Koyanagi, MD as Referring Physician (Ophthalmology) Mauri Pole, MD as Consulting Physician (Gastroenterology)    Assessment:   This is a routine wellness examination for Raye.   Hearing Screening   125Hz  250Hz  500Hz  1000Hz  2000Hz  3000Hz  4000Hz  6000Hz  8000Hz   Right ear:   40 40 40  40    Left ear:   40 40 40  40    Vision Screening Comments: Last vision exam in May 2018 with Dr. Wallace Going    Exercise Activities and Dietary recommendations Current Exercise Habits: The patient does not participate in regular exercise at present, Exercise limited by: None identified  Goals    . Increase physical activity     When weather permits, I will begin walking 60 minutes 3 days per week.        Fall Risk Fall Risk  11/26/2017 11/20/2016 11/22/2015 11/19/2014 11/07/2013  Falls in the past year? No Yes No No No  Comment - pt tripped and slid down stairs in home - - -  Number falls in past yr: - 1 - - -  Injury with Fall? - Yes - - -  Comment - pt stated there was pain to left elbow; ice pack placed; pt did not seek medical treatment - - -   Depression Screen PHQ 2/9 Scores 11/26/2017 11/20/2016 11/22/2015 11/19/2014  PHQ - 2 Score 0 0 0 0  PHQ- 9 Score 0 - - -     Cognitive Function MMSE - Mini Mental State Exam 11/26/2017 11/20/2016  Orientation to time 5 5  Orientation to Place 5 5  Registration 3 3  Attention/ Calculation 0 0  Recall 3 3  Language- name 2 objects 0 0  Language- repeat 1 1  Language- follow 3 step  command 3 3  Language- read & follow direction 0 0  Write a sentence 0 0  Copy design 0 0  Total score 20 20     PLEASE NOTE: A Mini-Cog screen was completed. Maximum score is 20. A value of 0 denotes this part of [place] MMSE was not completed or the patient failed this part of the Mini-Cog screening.   Mini-Cog Screening Orientation to Time - Max 5 pts Orientation to Place - Max 5 pts Registration - Max 3 pts Recall - Max 3 pts Language Repeat - Max 1 pts Language Follow 3 Step Command - Max 3 pts     Immunization History  Administered Date(s) Administered  . Influenza Split 07/27/2011, 07/12/2013  . Influenza Whole 08/08/2002, 07/20/2007  . Influenza, High Dose Seasonal PF 08/01/2015, 07/12/2017  . Influenza, Seasonal, Injecte, Preservative Fre 08/04/2016  . Influenza-Unspecified 07/13/2014  . Pneumococcal Conjugate-13 11/19/2014  . Pneumococcal Polysaccharide-23 05/01/2005, 11/01/2012  . Td 03/13/1999, 07/24/2010  . Zoster 08/12/2014   Screening Tests Health Maintenance  Topic Date Due  . MAMMOGRAM  01/08/2018  . COLONOSCOPY  08/26/2019  . TETANUS/TDAP  07/24/2020  . INFLUENZA VACCINE  Completed  . DEXA SCAN  Completed  . Hepatitis C Screening  Completed  . PNA vac Low Risk Adult  Completed      Plan:  . I have personally reviewed, addressed, and noted the following in the patient's chart:  A. Medical and social history B. Use of alcohol, tobacco or illicit drugs  C. Current medications and supplements D. Functional ability and status E.  Nutritional status F.  Physical activity G. Advance directives H.  List of other physicians I.  Hospitalizations, surgeries, and ER visits in previous 12 months J.  [place] to include hearing, vision, cognitive, depression L. Referrals and appointments - none  In addition, I have reviewed and discussed with patient certain preventive protocols, quality metrics, and best practice recommendations. A written  personalized care plan for preventive services as well as general preventive health recommendations were provided to patient.  See attached scanned questionnaire for additional information.   Signed,   Lindell Noe, MHA, BS, LPN Health Coach

## 2017-11-30 ENCOUNTER — Ambulatory Visit (INDEPENDENT_AMBULATORY_CARE_PROVIDER_SITE_OTHER): Payer: Medicare Other | Admitting: Family Medicine

## 2017-11-30 ENCOUNTER — Encounter: Payer: Self-pay | Admitting: Family Medicine

## 2017-11-30 ENCOUNTER — Other Ambulatory Visit: Payer: Self-pay | Admitting: Family Medicine

## 2017-11-30 VITALS — BP 116/68 | HR 87 | Temp 98.1°F | Ht 63.0 in | Wt 125.0 lb

## 2017-11-30 DIAGNOSIS — F17200 Nicotine dependence, unspecified, uncomplicated: Secondary | ICD-10-CM

## 2017-11-30 DIAGNOSIS — N6310 Unspecified lump in the right breast, unspecified quadrant: Secondary | ICD-10-CM

## 2017-11-30 DIAGNOSIS — M8589 Other specified disorders of bone density and structure, multiple sites: Secondary | ICD-10-CM

## 2017-11-30 DIAGNOSIS — F418 Other specified anxiety disorders: Secondary | ICD-10-CM | POA: Diagnosis not present

## 2017-11-30 DIAGNOSIS — N6019 Diffuse cystic mastopathy of unspecified breast: Secondary | ICD-10-CM

## 2017-11-30 DIAGNOSIS — E7800 Pure hypercholesterolemia, unspecified: Secondary | ICD-10-CM | POA: Diagnosis not present

## 2017-11-30 DIAGNOSIS — R739 Hyperglycemia, unspecified: Secondary | ICD-10-CM | POA: Diagnosis not present

## 2017-11-30 MED ORDER — ESCITALOPRAM OXALATE 20 MG PO TABS: 20.0000 mg | ORAL_TABLET | Freq: Every day | ORAL | 3 refills | Status: DC

## 2017-11-30 MED ORDER — MONTELUKAST SODIUM 10 MG PO TABS: 10.0000 mg | ORAL_TABLET | Freq: Every day | ORAL | 3 refills | Status: DC

## 2017-11-30 NOTE — Progress Notes (Signed)
Subjective:    Patient ID: WALLIS SPIZZIRRI, female    DOB: April 13, 1946, 72 y.o.   MRN: [id number]  HPI Here for annual f/u of chronic medical problems   Doing well overall  Last year had a lot of dental work  Sciatica - was terrible (better now)   Wt Readings from Last 3 Encounters:  11/30/17 125 lb (56.7 kg)  11/26/17 122 lb 4 oz (55.5 kg)  04/02/17 129 lb 4.8 oz (58.7 kg)  does a great job mt her weight  Goal this year is to start walking more  22.14 kg/m  Had amw visit 2/15 No concerns   Mammogram 3/18-addn imaging April  Reading: IMPRESSION: Approximate 4-5 mm likely benign intramammary lymph node in the inner right breast at posterior depth without sonographic correlate. This has been stable dating back to December, 2016.  RECOMMENDATION: Diagnostic right mammogram and possible right breast ultrasound in December, 2018, which may document 2 years of stability of right breast mass.---she did not go back in for  Self breast exam - she can feel the mammary area and it is sometimes sore   Has had a hysterectomy due to fibroids and endometriosis   Colonoscopy 11/17 adenoma (prior had an ulcer)  Recall 3-5 y  Father had colon cancer   dexa 3/18 osteopenia of the FN- worse than prior Smoker  Taking her ca and D  Exercise-walking when she can  / generally very active   Smoking status -1ppd  Not ready to quit yet  No breathing problems  Smoked since age 74  She was offered the lung screening program - she declines   zostavax 11/15  Hyperlipidemia Lab Results  Component Value Date   CHOL 201 (H) 11/26/2017   CHOL 218 (H) 11/20/2016   CHOL 200 11/20/2015   Lab Results  Component Value Date   HDL 75.10 11/26/2017   HDL 83.40 11/20/2016   HDL 82.10 11/20/2015   Lab Results  Component Value Date   LDLCALC 108 (H) 11/26/2017   LDLCALC 118 (H) 11/20/2016   LDLCALC 107 (H) 11/20/2015   Lab Results  Component Value Date   TRIG 93.0 11/26/2017   TRIG 82.0 11/20/2016   TRIG 58.0 11/20/2015   Lab Results  Component Value Date   CHOLHDL 3 11/26/2017   CHOLHDL 3 11/20/2016   CHOLHDL 2 11/20/2015   Lab Results  Component Value Date   LDLDIRECT 138.2 11/01/2013   LDLDIRECT 109.6 10/26/2012   HDL is still very high    Elevated glucose in the past Lab Results  Component Value Date   HGBA1C 5.9 11/26/2017  stable  Watches her processed carbs and eats healthy food    Mood -has been fine most of the time  Still taking lexapro  Sleeps more now   Other labs Results for orders placed or performed in visit on 11/26/17  TSH  Result Value Ref Range   TSH 4.25 0.35 - 4.50 uIU/mL  Lipid panel  Result Value Ref Range   Cholesterol 201 (H) 0 - 200 mg/dL   Triglycerides 93.0 0.0 - 149.0 mg/dL   HDL 75.10 >39.00 mg/dL   VLDL 18.6 0.0 - 40.0 mg/dL   LDL Cholesterol 108 (H) 0 - 99 mg/dL   Total CHOL/HDL Ratio 3    NonHDL 126.22   Hemoglobin A1c  Result Value Ref Range   Hgb A1c MFr Bld 5.9 4.6 - 6.5 %  Comprehensive metabolic panel  Result Value Ref Range   Sodium  138 135 - 145 mEq/L   Potassium 4.2 3.5 - 5.1 mEq/L   Chloride 101 96 - 112 mEq/L   CO2 32 19 - 32 mEq/L   Glucose, Bld 104 (H) 70 - 99 mg/dL   BUN 15 6 - 23 mg/dL   Creatinine, Ser 0.83 0.40 - 1.20 mg/dL   Total Bilirubin 0.6 0.2 - 1.2 mg/dL   Alkaline Phosphatase 82 39 - 117 U/L   AST 22 0 - 37 U/L   ALT 12 0 - 35 U/L   Total Protein 7.0 6.0 - 8.3 g/dL   Albumin 4.3 3.5 - 5.2 g/dL   Calcium 9.5 8.4 - 10.5 mg/dL   GFR 72.17 >60.00 mL/min  CBC with Differential/Platelet  Result Value Ref Range   WBC 7.7 4.0 - 10.5 K/uL   RBC 4.73 3.87 - 5.11 Mil/uL   Hemoglobin 15.0 12.0 - 15.0 g/dL   HCT 44.5 36.0 - 46.0 %   MCV 94.0 78.0 - 100.0 fl   MCHC 33.8 30.0 - 36.0 g/dL   RDW 13.0 11.5 - 15.5 %   Platelets 273.0 150.0 - 400.0 K/uL   Neutrophils Relative % 67.2 43.0 - 77.0 %   Lymphocytes Relative 19.3 12.0 - 46.0 %   Monocytes Relative 10.2 3.0 - 12.0 %    Eosinophils Relative 2.6 0.0 - 5.0 %   Basophils Relative 0.7 0.0 - 3.0 %   Neutro Abs 5.2 1.4 - 7.7 K/uL   Lymphs Abs 1.5 0.7 - 4.0 K/uL   Monocytes Absolute 0.8 0.1 - 1.0 K/uL   Eosinophils Absolute 0.2 0.0 - 0.7 K/uL   Basophils Absolute 0.1 0.0 - 0.1 K/uL     Patient Active Problem List   Diagnosis Date Noted  . Lump of breast, right 11/30/2017  . Estrogen deficiency 11/23/2016  . Hyperglycemia 11/14/2016  . Screening for thyroid disorder 11/14/2016  . Family history of colon cancer 11/22/2015  . Colon cancer screening 11/22/2015  . Encounter for Medicare annual wellness exam 10/31/2013  . Headache(784.0) 11/01/2012  . Gynecologic exam normal 09/15/2011  . Routine general medical examination at a health care facility 09/07/2011  . ONYCHOMYCOSIS 07/24/2010  . PURE HYPERCHOLESTEROLEMIA 07/21/2010  . COLONIC POLYPS, HYPERPLASTIC, HX OF 02/18/2010  . Nonspecific (abnormal) findings on radiological and other examination of body structure 08/02/2008  . [place] LUNG FIELD 08/02/2008  . Osteopenia 07/20/2007  . ANXIETY 02/11/2007  . TOBACCO ABUSE 02/11/2007  . Depression with anxiety 02/11/2007  . GERD 02/11/2007  . INSOMNIA 02/11/2007  . Fibrocystic breast changes 02/11/2007   Past Medical History:  Diagnosis Date  . Anxiety   . Arthritis    left hip  . Chronic headaches   . Colon polyps   . Depression   . GERD (gastroesophageal reflux disease)   . IBS (irritable bowel syndrome)   . Insomnia   . Osteopenia   . Tobacco abuse    Past Surgical History:  Procedure Laterality Date  . ABDOMINAL HYSTERECTOMY     bleeding, fibroid, endometriosis (total)  . CATARACT EXTRACTION W/PHACO Left 02/11/2017   Procedure: CATARACT EXTRACTION PHACO AND INTRAOCULAR LENS PLACEMENT ([facility]);  Surgeon: Leandrew Koyanagi, MD;  Location: [facility] ORS;  Service: Ophthalmology;  Laterality: Left;  [place] 1:02.8 AP% 13.7 CDE 8.58 FLUID PACK LOT # [id number] H  . CATARACT  EXTRACTION W/PHACO Right 03/10/2017   Procedure: CATARACT EXTRACTION PHACO AND INTRAOCULAR LENS PLACEMENT ([facility]) Right symfony toric lens;  Surgeon: Leandrew Koyanagi, MD;  Location:  [place];  Service: Ophthalmology;  Laterality: Right;  symfony toric lens prefers early  . CT of chest  10/09   bronchiectasis  . LAPAROSCOPY  1984   Social History   Tobacco Use  . Smoking status: Current Every Day Smoker    Packs/day: 1.00    Types: Cigarettes  . Smokeless tobacco: Never Used  Substance Use Topics  . Alcohol use: Yes    Alcohol/week: 1.2 oz    Types: 1 Glasses of wine, 1 Cans of beer per week    Comment:    . Drug use: No   Family History  Problem Relation Age of Onset  . Heart disease Other   . Cancer Mother        pancreatic, liver  . Cancer Father        colon  . Colon cancer Father 59  . Thyroid disease Sister   . Colon polyps Sister   . Irritable bowel syndrome Sister   . Breast cancer Sister   . Cancer Sister   . Cancer Maternal Aunt        breast  . Breast cancer Maternal Aunt   . Cancer Paternal Grandmother        thyroid  . Miscarriages / [place] Cousin   . Cancer Cousin        breast  . Diabetes Cousin   . Thyroid disease Sister    Allergies  Allergen Reactions  . Adhesive [Tape]     Tears skin  . Alendronate Sodium     REACTION: muscle, joint pain, indigestion  . Penicillins   . Varenicline Tartrate Nausea Only       . Doxycycline Nausea And Vomiting and Rash        Current Outpatient Medications on File Prior to Visit  Medication Sig Dispense Refill  . Calcium Citrate-Vitamin D (CITRACAL/VITAMIN D) 250-200 MG-UNIT TABS Take 4 tablets by mouth daily.     . cetirizine (ZYRTEC) 10 MG tablet Take 10 mg by mouth daily.      . Cholecalciferol (VITAMIN D PO) Take by mouth daily.    . cyclobenzaprine (FLEXERIL) 10 MG tablet Take 1 tablet (10 mg total) by mouth at bedtime. 5 tablet 0  . escitalopram (LEXAPRO) 20 MG tablet Take 1 tablet  (20 mg total) by mouth daily. 90 tablet 3  . fish oil-omega-3 fatty acids 1000 MG capsule Take 2 g by mouth daily.     . Lactobacillus (ACIDOPHILUS) 100 MG CAPS Take 1 capsule by mouth daily.      . Magnesium 250 MG TABS Take 1 tablet by mouth daily.    . Melatonin 1 MG TABS 5 mg at bedtime. As directed at bedtime.     . montelukast (SINGULAIR) 10 MG tablet Take 1 tablet (10 mg total) by mouth at bedtime. 90 tablet 3  . Multiple Vitamin (MULTIVITAMIN) tablet Take 1 tablet by mouth daily.      Marland Kitchen Phenylephrine-Bromphen-DM (PHENYLEPHRINE COMPLEX PO) Take 1 tablet up to every 4 hours as needed     . SUMAtriptan (IMITREX) 50 MG tablet Take 50 mg by mouth daily as needed. May repeat in 2 hours if headache persists or recurs.      No current facility-administered medications on file prior to visit.     Review of Systems  Constitutional: Negative for activity change, appetite change, fatigue, fever and unexpected weight change.  HENT: Negative for congestion, ear pain, rhinorrhea, sinus pressure and sore throat.   Eyes: Negative  for pain, redness and visual disturbance.  Respiratory: Negative for cough, shortness of breath and wheezing.   Cardiovascular: Negative for chest pain and palpitations.  Gastrointestinal: Negative for abdominal pain, blood in stool, constipation and diarrhea.  Endocrine: Negative for polydipsia and polyuria.  Genitourinary: Negative for dysuria, frequency and urgency.  Musculoskeletal: Negative for arthralgias, back pain and myalgias.  Skin: Negative for pallor and rash.  Allergic/Immunologic: Negative for environmental allergies.  Neurological: Negative for dizziness, syncope and headaches.  Hematological: Negative for adenopathy. Does not bruise/bleed easily.  Psychiatric/Behavioral: Negative for decreased concentration and dysphoric mood. The patient is not nervous/anxious.        Objective:   Physical Exam  Constitutional: She appears well-developed and  well-nourished. No distress.  Well appearing   HENT:  Head: Normocephalic and atraumatic.  Right Ear: External ear normal.  Left Ear: External ear normal.  Mouth/Throat: Oropharynx is clear and moist.  Eyes: Conjunctivae and EOM are normal. Pupils are equal, round, and reactive to light. No scleral icterus.  Neck: Normal range of motion. Neck supple. No JVD present. Carotid bruit is not present. No thyromegaly present.  Cardiovascular: Normal rate, regular rhythm, normal heart sounds and intact distal pulses. Exam reveals no gallop.  Pulmonary/Chest: Effort normal and breath sounds normal. No respiratory distress. She has no wheezes. She exhibits no tenderness.  Abdominal: Soft. Bowel sounds are normal. She exhibits no distension, no abdominal bruit and no mass. There is no tenderness.  Genitourinary: No breast swelling, tenderness, discharge or bleeding.  Genitourinary Comments: Breast exam: No mass, nodules, thickening, tenderness, bulging, retraction, inflamation, nipple discharge or skin changes noted.  No axillary or clavicular LA.    Generally dense breasts  R sided lump from imaging not noted on exam  Musculoskeletal: Normal range of motion. She exhibits no edema or tenderness.  No kyphosis   Lymphadenopathy:    She has no cervical adenopathy.  Neurological: She is alert. She has normal reflexes. No cranial nerve deficit. She exhibits normal muscle tone. Coordination normal.  Skin: Skin is warm and dry. No rash noted. No erythema. No pallor.  Solar lentigines diffusely   Psychiatric: She has a normal mood and affect.          Assessment & Plan:   Problem List Items Addressed This Visit      Musculoskeletal and Integument   Osteopenia - Primary    dexa 3/18 osteopenia at [facility] In smoker  Disc need for calcium/ vitamin D/ wt bearing exercise and bone density test every 2 y to monitor Disc safety/ fracture risk in detail   On ca and D Walking for exercise  Enc smoking  cessation Not interested in tx at this time        Other   Depression with anxiety    Mood is stable with lexapro and sleep is improved Reviewed stressors/ coping techniques/symptoms/ support sources/ tx options and side effects in detail today Will refill this       Relevant Medications   escitalopram (LEXAPRO) 20 MG tablet   Fibrocystic breast changes    Scheduled diagnostic mammogram Nl breast exam today  Encouraged monthly self exams   Pt choose not to f/u in dec for R breast mass because it had been stable       Hyperglycemia    Lab Results  Component Value Date   HGBA1C 5.9 11/26/2017   Overall stable disc imp of low glycemic diet and wt loss to prevent DM2  Lump of breast, right    Intramammary LN on imaging  Recommended f/u in dec- pt decided against it  Ref made for diagnostic mammogram  (almost due for screening as well)      Relevant Orders   MM DIAG BREAST TOMO BILATERAL   PURE HYPERCHOLESTEROLEMIA    Disc goals for lipids and reasons to control them Rev labs with pt Rev low sat fat diet in detail  HDL remains high      TOBACCO ABUSE    Disc in detail risks of smoking and possible outcomes including copd, vascular/ heart disease, cancer , respiratory and sinus infections, and osteoporosis  Pt voices understanding She is not ready to quit yet  Disc CT lung cancer screening program- she declines

## 2017-11-30 NOTE — Patient Instructions (Addendum)
Take care of yourself   Please think about quitting smoking   Stay active   We will refer you for the diagnostic mammogram

## 2017-12-01 NOTE — Assessment & Plan Note (Signed)
Mood is stable with lexapro and sleep is improved Reviewed stressors/ coping techniques/symptoms/ support sources/ tx options and side effects in detail today Will refill this

## 2017-12-01 NOTE — Assessment & Plan Note (Signed)
Intramammary LN on imaging  Recommended f/u in dec- pt decided against it  Ref made for diagnostic mammogram  (almost due for screening as well)

## 2017-12-01 NOTE — Assessment & Plan Note (Signed)
Lab Results  Component Value Date   HGBA1C 5.9 11/26/2017   Overall stable disc imp of low glycemic diet and wt loss to prevent DM2

## 2017-12-01 NOTE — Assessment & Plan Note (Addendum)
Scheduled diagnostic mammogram Nl breast exam today  Encouraged monthly self exams   Pt choose not to f/u in dec for R breast mass because it had been stable

## 2017-12-01 NOTE — Assessment & Plan Note (Signed)
dexa 3/18 osteopenia at [facility] In smoker  Disc need for calcium/ vitamin D/ wt bearing exercise and bone density test every 2 y to monitor Disc safety/ fracture risk in detail   On ca and D Walking for exercise  Enc smoking cessation Not interested in tx at this time

## 2017-12-01 NOTE — Assessment & Plan Note (Signed)
Disc goals for lipids and reasons to control them Rev labs with pt Rev low sat fat diet in detail  HDL remains high

## 2017-12-01 NOTE — Assessment & Plan Note (Addendum)
Disc in detail risks of smoking and possible outcomes including copd, vascular/ heart disease, cancer , respiratory and sinus infections, and osteoporosis  Pt voices understanding She is not ready to quit yet  Disc CT lung cancer screening program- she declines

## 2017-12-24 NOTE — Telephone Encounter (Signed)
Nurse spoke with patient.

## 2017-12-31 ENCOUNTER — Telehealth: Payer: Self-pay | Admitting: Family Medicine

## 2017-12-31 MED ORDER — ESCITALOPRAM OXALATE 20 MG PO TABS: 20.0000 mg | ORAL_TABLET | Freq: Every day | ORAL | 3 refills | Status: DC

## 2017-12-31 NOTE — Telephone Encounter (Signed)
Contacted pt and made her aware of shipping and delivery information per [place] at [facility]; pt offered the option of sending a prescription to a local pharmacy but she declines; she states that she has enough for a week; pt also instructed to contact [facility] to discuss shipment options.

## 2017-12-31 NOTE — Telephone Encounter (Signed)
Attempted to contact pharmacy regarding pt's prescription status; spoke with [facility] representative, Earnest Bailey; she confirms receipt and was delivered 12/04/17.

## 2017-12-31 NOTE — Telephone Encounter (Signed)
Copied from [place]. Topic: Quick Communication - Rx Refill/Question >> Dec 31, 2017  2:12 PM Synthia Innocent wrote: Medication: escitalopram (LEXAPRO) 20 MG tablet  Has the patient contacted their pharmacy? Yes.   (Agent: If no, request that the patient contact the pharmacy for the refill.) Preferred Pharmacy (with phone number or street name): [facility] Agent: Please be advised that RX refills may take up to 3 business days. We ask that you follow-up with your pharmacy.

## 2017-12-31 NOTE — Telephone Encounter (Signed)
Attempted to contact pharmacy regarding pt's prescription status; spoke with [facility] representative, Earnest Bailey; she confirms receipt and was delivered 12/04/17; she also states that the pt needs to contact [facility] to discuss delivery options, and if needed a short term supply of medication can be sent to local pharmacy; will relay this information to pt.

## 2017-12-31 NOTE — Telephone Encounter (Signed)
Attempted to contact pharmacy to cancel previously requested refill placed on 12/31/17; spoke with Clare Gandy and he states that today's request will not process.

## 2017-12-31 NOTE — Telephone Encounter (Signed)
Contacted pt regarding prescription refill; she states that her montelukast arrived but not her escitalpram; chart reflects order received 11/30/17; will contact pharmacy to check on status of prescription.

## 2017-12-31 NOTE — Telephone Encounter (Signed)
Contacted pt and made her aware of shipping and delivery information per [place] at [facility]; pt offered the option of sending a prescription to a local pharmacy but she declines; she states that she has enough for a week; pt also instructed to contact [facility] to discuss shipment options.

## 2017-12-31 NOTE — Telephone Encounter (Signed)
Patient said she found her script. It was pushed in the back of med cabinet. She said she is sorry

## 2018-01-10 ENCOUNTER — Other Ambulatory Visit: Payer: Self-pay | Admitting: Family Medicine

## 2018-01-10 ENCOUNTER — Ambulatory Visit
Admission: RE | Admit: 2018-01-10 | Discharge: 2018-01-10 | Disposition: A | Discharge status: Home / Self Care | Payer: Medicare Other | Source: Ambulatory Visit | Attending: Family Medicine | Admitting: Family Medicine

## 2018-01-10 DIAGNOSIS — N6310 Unspecified lump in the right breast, unspecified quadrant: Secondary | ICD-10-CM

## 2018-01-10 DIAGNOSIS — N6314 Unspecified lump in the right breast, lower inner quadrant: Secondary | ICD-10-CM | POA: Diagnosis not present

## 2018-01-10 DIAGNOSIS — R922 Inconclusive mammogram: Secondary | ICD-10-CM | POA: Diagnosis not present

## 2018-01-10 HISTORY — PX: BREAST LUMPECTOMY: SHX2

## 2018-01-11 ENCOUNTER — Other Ambulatory Visit: Payer: Self-pay | Admitting: Family Medicine

## 2018-01-11 DIAGNOSIS — N6310 Unspecified lump in the right breast, unspecified quadrant: Secondary | ICD-10-CM

## 2018-01-12 ENCOUNTER — Ambulatory Visit
Admission: RE | Admit: 2018-01-12 | Discharge: 2018-01-12 | Disposition: A | Discharge status: Home / Self Care | Payer: Medicare Other | Source: Ambulatory Visit | Attending: Family Medicine | Admitting: Family Medicine

## 2018-01-12 DIAGNOSIS — C50311 Malignant neoplasm of lower-inner quadrant of right female breast: Secondary | ICD-10-CM | POA: Diagnosis not present

## 2018-01-12 DIAGNOSIS — N6310 Unspecified lump in the right breast, unspecified quadrant: Secondary | ICD-10-CM

## 2018-01-12 DIAGNOSIS — N6314 Unspecified lump in the right breast, lower inner quadrant: Secondary | ICD-10-CM | POA: Diagnosis not present

## 2018-01-17 ENCOUNTER — Ambulatory Visit: Payer: Self-pay | Admitting: General Surgery

## 2018-01-17 DIAGNOSIS — C50311 Malignant neoplasm of lower-inner quadrant of right female breast: Secondary | ICD-10-CM | POA: Diagnosis not present

## 2018-01-18 ENCOUNTER — Other Ambulatory Visit: Payer: Self-pay | Admitting: General Surgery

## 2018-01-18 DIAGNOSIS — C50311 Malignant neoplasm of lower-inner quadrant of right female breast: Secondary | ICD-10-CM

## 2018-01-19 ENCOUNTER — Telehealth: Payer: Self-pay | Admitting: Oncology

## 2018-01-19 ENCOUNTER — Encounter: Payer: Self-pay | Admitting: Radiation Oncology

## 2018-01-19 ENCOUNTER — Encounter: Payer: Self-pay | Admitting: Oncology

## 2018-01-19 NOTE — Telephone Encounter (Signed)
Appt has been scheduled for the pt to see Dr. Jana Hakim on 4/16 at 4pm. Letter mailed.

## 2018-01-20 ENCOUNTER — Telehealth: Payer: Self-pay | Admitting: Hematology and Oncology

## 2018-01-20 NOTE — Pre-Procedure Instructions (Signed)
    Cheryl Aguirre  01/20/2018      [facility] - [name], [place] [address] [place] [address] Phone: [phone] Fax: [phone]  [place], [name], [name] [name], [place] [address] Phone: [phone] Fax: [phone]  [id number] - [place], [place] - [address] [place] [address] Phone: [phone] Fax: [phone]  [facility] [place], [place] - [address] AT [address] [place] [address] Phone: [phone] Fax: [phone]    Your procedure is scheduled on 01/31/18.  [facility] to [facility] at 630 A.M.  Call this number if you have problems the morning of surgery:  [phone]   Remember:  Do not eat food or drink liquids after midnight.  Take these medicines the morning of surgery with A SIP OF WATER --zyrtec,lexpro   Do not wear jewelry, make-up or nail polish.  Do not wear lotions, powders, or perfumes, or deodorant.  Do not shave 48 hours prior to surgery.  Men may shave face and neck.  Do not bring valuables to the hospital.  [facility] is not responsible for any belongings or valuables.  Contacts, dentures or bridgework may not be worn into surgery.  Leave your suitcase in the car.  After surgery it may be brought to your room.  For patients admitted to the hospital, discharge time will be determined by your treatment team.  Patients discharged the day of surgery will not be allowed to drive home.   Name and phone number of your driver:    Special instructions:  DRINK ALL OF ENSURE [place] LEAVING [place]  Please read over the following fact sheets that you were given.

## 2018-01-20 NOTE — Telephone Encounter (Signed)
Patient returned call regarding appt being moved from 4/16 to 4/30 per her request per 4/11 ref message from [facility]

## 2018-01-21 ENCOUNTER — Ambulatory Visit (HOSPITAL_COMMUNITY)
Admission: RE | Admit: 2018-01-21 | Discharge: 2018-01-21 | Disposition: A | Discharge status: Home / Self Care | Payer: Medicare Other | Source: Ambulatory Visit | Attending: General Surgery | Admitting: General Surgery

## 2018-01-21 ENCOUNTER — Encounter (HOSPITAL_COMMUNITY)
Admission: RE | Admit: 2018-01-21 | Discharge: 2018-01-21 | Disposition: A | Discharge status: Home / Self Care | Payer: Medicare Other | Source: Ambulatory Visit | Attending: General Surgery | Admitting: General Surgery

## 2018-01-21 ENCOUNTER — Encounter (HOSPITAL_COMMUNITY): Payer: Self-pay

## 2018-01-21 DIAGNOSIS — J984 Other disorders of lung: Secondary | ICD-10-CM | POA: Diagnosis not present

## 2018-01-21 DIAGNOSIS — C50911 Malignant neoplasm of unspecified site of right female breast: Secondary | ICD-10-CM | POA: Insufficient documentation

## 2018-01-21 DIAGNOSIS — Z01810 Encounter for preprocedural cardiovascular examination: Secondary | ICD-10-CM

## 2018-01-21 DIAGNOSIS — Z01818 Encounter for other preprocedural examination: Secondary | ICD-10-CM | POA: Diagnosis not present

## 2018-01-21 DIAGNOSIS — I10 Essential (primary) hypertension: Secondary | ICD-10-CM | POA: Diagnosis not present

## 2018-01-21 DIAGNOSIS — Z01812 Encounter for preprocedural laboratory examination: Secondary | ICD-10-CM | POA: Insufficient documentation

## 2018-01-21 DIAGNOSIS — R9431 Abnormal electrocardiogram [ECG] [EKG]: Secondary | ICD-10-CM | POA: Diagnosis not present

## 2018-01-21 HISTORY — DX: Essential (primary) hypertension: I10

## 2018-01-21 LAB — COMPREHENSIVE METABOLIC PANEL
ALBUMIN: 4.1 g/dL (ref 3.5–5.0)
ALT: 18 U/L (ref 14–54)
ANION GAP: 11 (ref 5–15)
AST: 28 U/L (ref 15–41)
Alkaline Phosphatase: 82 U/L (ref 38–126)
BILIRUBIN TOTAL: 0.6 mg/dL (ref 0.3–1.2)
BUN: 14 mg/dL (ref 6–20)
CHLORIDE: 102 mmol/L (ref 101–111)
CO2: 25 mmol/L (ref 22–32)
Calcium: 9.7 mg/dL (ref 8.9–10.3)
Creatinine, Ser: 0.89 mg/dL (ref 0.44–1.00)
GFR calc Af Amer: >60 mL/min (ref >60)
GFR calc non Af Amer: >60 mL/min (ref >60)
GLUCOSE: 102 mg/dL — AB (ref 65–99)
POTASSIUM: 3.9 mmol/L (ref 3.5–5.1)
Sodium: 138 mmol/L (ref 135–145)
TOTAL PROTEIN: 6.9 g/dL (ref 6.5–8.1)

## 2018-01-21 LAB — CBC WITH DIFFERENTIAL/PLATELET
BASOS ABS: 0 10*3/uL (ref 0.0–0.1)
BASOS PCT: 1 %
EOS ABS: 0.2 10*3/uL (ref 0.0–0.7)
Eosinophils Relative: 3 %
HEMATOCRIT: 43.8 % (ref 36.0–46.0)
Hemoglobin: 14.8 g/dL (ref 12.0–15.0)
Lymphocytes Relative: 28 %
Lymphs Abs: 2.1 10*3/uL (ref 0.7–4.0)
MCH: 31.5 pg (ref 26.0–34.0)
MCHC: 33.8 g/dL (ref 30.0–36.0)
MCV: 93.2 fL (ref 78.0–100.0)
MONO ABS: 0.8 10*3/uL (ref 0.1–1.0)
MONOS PCT: 10 %
NEUTROS ABS: 4.5 10*3/uL (ref 1.7–7.7)
Neutrophils Relative %: 58 %
Platelets: 240 10*3/uL (ref 150–400)
RBC: 4.7 MIL/uL (ref 3.87–5.11)
RDW: 13 % (ref 11.5–15.5)
WBC: 7.7 10*3/uL (ref 4.0–10.5)

## 2018-01-21 MED ORDER — CHLORHEXIDINE GLUCONATE CLOTH 2 % EX PADS: 6.0000 | MEDICATED_PAD | Freq: Once | CUTANEOUS | Status: DC

## 2018-01-24 NOTE — Progress Notes (Signed)
Anesthesia Chart Review:  Pt is a 72 year old female scheduled for R breast lumpectomy with radioactive seed and sentinel lymph node biopsy on 01/31/2018 with Jovita Kussmaul, MD  - PCP is Loura Pardon, MD  PMH includes:  HTN (no meds), GERD. Current smoker. BMI 21.5. S/p cataract extraction 02/11/17 and 03/10/17.  Medications reviewed.  BP (!) 149/70   Pulse 91   Temp (!) 36.4 C   Resp 20   Ht 5\' 3"  (1.6 m)   Wt 121 lb 12.8 oz (55.2 kg)   SpO2 98%   BMI 21.58 kg/m   Preoperative labs reviewed.    CXR 01/21/18: No edema or consolidation. Scattered areas of scarring bilaterally. No adenopathy.  EKG 01/21/18: NSR. Possible LA enlargement. Cannot rule out Septal infarct, age undetermined  If no changes, I anticipate pt can proceed with surgery as scheduled.   Willeen Cass, FNP-BC [facility]/Anesthesiology Phone: [phone] 01/24/2018 10:49 AM

## 2018-01-25 ENCOUNTER — Ambulatory Visit: Payer: Medicare Other | Admitting: Oncology

## 2018-01-28 ENCOUNTER — Ambulatory Visit
Admission: RE | Admit: 2018-01-28 | Discharge: 2018-01-28 | Disposition: A | Discharge status: Home / Self Care | Payer: Medicare Other | Source: Ambulatory Visit | Attending: General Surgery | Admitting: General Surgery

## 2018-01-28 DIAGNOSIS — C50311 Malignant neoplasm of lower-inner quadrant of right female breast: Secondary | ICD-10-CM

## 2018-01-28 DIAGNOSIS — C50911 Malignant neoplasm of unspecified site of right female breast: Secondary | ICD-10-CM | POA: Diagnosis not present

## 2018-01-31 ENCOUNTER — Ambulatory Visit (HOSPITAL_COMMUNITY): Payer: Medicare Other | Admitting: Emergency Medicine

## 2018-01-31 ENCOUNTER — Ambulatory Visit (HOSPITAL_COMMUNITY)
Admission: RE | Admit: 2018-01-31 | Discharge: 2018-01-31 | Disposition: A | Discharge status: Home / Self Care | Payer: Medicare Other | Source: Ambulatory Visit | Attending: General Surgery | Admitting: General Surgery

## 2018-01-31 ENCOUNTER — Encounter (HOSPITAL_COMMUNITY)
Admission: RE | Disposition: A | Discharge status: Home / Self Care | Payer: Self-pay | Source: Ambulatory Visit | Attending: General Surgery

## 2018-01-31 ENCOUNTER — Other Ambulatory Visit: Payer: Self-pay

## 2018-01-31 ENCOUNTER — Ambulatory Visit (HOSPITAL_COMMUNITY): Payer: Medicare Other | Admitting: Certified Registered Nurse Anesthetist

## 2018-01-31 ENCOUNTER — Encounter (HOSPITAL_COMMUNITY): Payer: Self-pay | Admitting: Surgery

## 2018-01-31 ENCOUNTER — Ambulatory Visit
Admission: RE | Admit: 2018-01-31 | Discharge: 2018-01-31 | Disposition: A | Discharge status: Home / Self Care | Payer: Medicare Other | Source: Ambulatory Visit | Attending: General Surgery | Admitting: General Surgery

## 2018-01-31 DIAGNOSIS — C50311 Malignant neoplasm of lower-inner quadrant of right female breast: Secondary | ICD-10-CM | POA: Diagnosis not present

## 2018-01-31 DIAGNOSIS — F419 Anxiety disorder, unspecified: Secondary | ICD-10-CM | POA: Diagnosis not present

## 2018-01-31 DIAGNOSIS — G8918 Other acute postprocedural pain: Secondary | ICD-10-CM | POA: Diagnosis not present

## 2018-01-31 DIAGNOSIS — F17200 Nicotine dependence, unspecified, uncomplicated: Secondary | ICD-10-CM | POA: Diagnosis not present

## 2018-01-31 DIAGNOSIS — F329 Major depressive disorder, single episode, unspecified: Secondary | ICD-10-CM | POA: Insufficient documentation

## 2018-01-31 DIAGNOSIS — Z79899 Other long term (current) drug therapy: Secondary | ICD-10-CM | POA: Diagnosis not present

## 2018-01-31 DIAGNOSIS — K219 Gastro-esophageal reflux disease without esophagitis: Secondary | ICD-10-CM | POA: Diagnosis not present

## 2018-01-31 DIAGNOSIS — Z880 Allergy status to penicillin: Secondary | ICD-10-CM | POA: Diagnosis not present

## 2018-01-31 DIAGNOSIS — I10 Essential (primary) hypertension: Secondary | ICD-10-CM | POA: Diagnosis not present

## 2018-01-31 DIAGNOSIS — C50911 Malignant neoplasm of unspecified site of right female breast: Secondary | ICD-10-CM | POA: Diagnosis not present

## 2018-01-31 DIAGNOSIS — D0511 Intraductal carcinoma in situ of right breast: Secondary | ICD-10-CM | POA: Diagnosis not present

## 2018-01-31 DIAGNOSIS — F418 Other specified anxiety disorders: Secondary | ICD-10-CM | POA: Diagnosis not present

## 2018-01-31 HISTORY — PX: BREAST LUMPECTOMY WITH RADIOACTIVE SEED AND SENTINEL LYMPH NODE BIOPSY: SHX6550

## 2018-01-31 SURGERY — BREAST LUMPECTOMY WITH RADIOACTIVE SEED AND SENTINEL LYMPH NODE BIOPSY
Anesthesia: General | Site: Breast | Laterality: Right

## 2018-01-31 MED ORDER — FENTANYL CITRATE (PF) 250 MCG/5ML IJ SOLN
INTRAMUSCULAR | Status: AC
  Filled 2018-01-31: qty 5

## 2018-01-31 MED ORDER — HEPARIN SOD (PORK) LOCK FLUSH 100 UNIT/ML IV SOLN
INTRAVENOUS | Status: AC
  Filled 2018-01-31: qty 5

## 2018-01-31 MED ORDER — MEPERIDINE HCL 50 MG/ML IJ SOLN: 6.2500 mg | INTRAMUSCULAR | Status: DC | PRN

## 2018-01-31 MED ORDER — FENTANYL CITRATE (PF) 100 MCG/2ML IJ SOLN
INTRAMUSCULAR | Status: DC | PRN
  Administered 2018-01-31 (×2): 50 ug via INTRAVENOUS

## 2018-01-31 MED ORDER — MIDAZOLAM HCL 2 MG/2ML IJ SOLN
INTRAMUSCULAR | Status: AC
  Filled 2018-01-31: qty 2

## 2018-01-31 MED ORDER — ROCURONIUM BROMIDE 100 MG/10ML IV SOLN
INTRAVENOUS | Status: DC | PRN
  Administered 2018-01-31: 60 mg via INTRAVENOUS

## 2018-01-31 MED ORDER — SODIUM CHLORIDE 0.9 % IV SOLN
INTRAVENOUS | Status: AC
  Filled 2018-01-31: qty 1.2

## 2018-01-31 MED ORDER — ONDANSETRON HCL 4 MG/2ML IJ SOLN
INTRAMUSCULAR | Status: AC
  Filled 2018-01-31: qty 2

## 2018-01-31 MED ORDER — LIDOCAINE 2% (20 MG/ML) 5 ML SYRINGE
INTRAMUSCULAR | Status: DC | PRN
  Administered 2018-01-31: 60 mg via INTRAVENOUS

## 2018-01-31 MED ORDER — MIDAZOLAM HCL 2 MG/2ML IJ SOLN
INTRAMUSCULAR | Status: DC | PRN
  Administered 2018-01-31: 2 mg via INTRAVENOUS

## 2018-01-31 MED ORDER — 0.9 % SODIUM CHLORIDE (POUR BTL) OPTIME
TOPICAL | Status: DC | PRN
  Administered 2018-01-31: 1000 mL

## 2018-01-31 MED ORDER — OXYCODONE HCL 5 MG PO TABS: 5.0000 mg | ORAL_TABLET | Freq: Once | ORAL | Status: DC | PRN

## 2018-01-31 MED ORDER — ONDANSETRON HCL 4 MG/2ML IJ SOLN
INTRAMUSCULAR | Status: DC | PRN
  Administered 2018-01-31: 4 mg via INTRAVENOUS

## 2018-01-31 MED ORDER — LACTATED RINGERS IV SOLN
INTRAVENOUS | Status: DC
  Administered 2018-01-31 (×2): via INTRAVENOUS

## 2018-01-31 MED ORDER — BUPIVACAINE-EPINEPHRINE 0.25% -1:200000 IJ SOLN
INTRAMUSCULAR | Status: DC | PRN
  Administered 2018-01-31: 20 mL

## 2018-01-31 MED ORDER — TECHNETIUM TC 99M SULFUR COLLOID FILTERED
1.0000 | Freq: Once | INTRAVENOUS | Status: AC | PRN
  Administered 2018-01-31: 1 via INTRADERMAL

## 2018-01-31 MED ORDER — PHENYLEPHRINE 40 MCG/ML (10ML) SYRINGE FOR IV PUSH (FOR BLOOD PRESSURE SUPPORT)
PREFILLED_SYRINGE | INTRAVENOUS | Status: DC | PRN
  Administered 2018-01-31: 120 ug via INTRAVENOUS
  Administered 2018-01-31: 160 ug via INTRAVENOUS
  Administered 2018-01-31 (×2): 120 ug via INTRAVENOUS

## 2018-01-31 MED ORDER — PROPOFOL 10 MG/ML IV BOLUS
INTRAVENOUS | Status: DC | PRN
  Administered 2018-01-31: 200 mg via INTRAVENOUS

## 2018-01-31 MED ORDER — METHYLENE BLUE 0.5 % INJ SOLN
INTRAVENOUS | Status: AC
  Filled 2018-01-31: qty 20

## 2018-01-31 MED ORDER — CELECOXIB 200 MG PO CAPS
200.0000 mg | ORAL_CAPSULE | ORAL | Status: AC
  Administered 2018-01-31: 200 mg via ORAL
  Filled 2018-01-31: qty 1

## 2018-01-31 MED ORDER — OXYCODONE HCL 5 MG/5ML PO SOLN
5.0000 mg | Freq: Once | ORAL | Status: DC | PRN
Start: 2018-01-31 — End: 2018-01-31

## 2018-01-31 MED ORDER — ROPIVACAINE HCL 5 MG/ML IJ SOLN
INTRAMUSCULAR | Status: DC | PRN
  Administered 2018-01-31: 30 mL via PERINEURAL

## 2018-01-31 MED ORDER — SUGAMMADEX SODIUM 200 MG/2ML IV SOLN
INTRAVENOUS | Status: DC | PRN
  Administered 2018-01-31: 110 mg via INTRAVENOUS

## 2018-01-31 MED ORDER — DEXTROSE 5 % IV SOLN
INTRAVENOUS | Status: DC | PRN
  Administered 2018-01-31: 20 ug/min via INTRAVENOUS

## 2018-01-31 MED ORDER — GLYCOPYRROLATE 0.2 MG/ML IJ SOLN
INTRAMUSCULAR | Status: DC | PRN
  Administered 2018-01-31: 0.2 mg via INTRAVENOUS

## 2018-01-31 MED ORDER — SODIUM CHLORIDE 0.9 % IJ SOLN
INTRAMUSCULAR | Status: AC
  Filled 2018-01-31: qty 20

## 2018-01-31 MED ORDER — SUGAMMADEX SODIUM 200 MG/2ML IV SOLN
INTRAVENOUS | Status: AC
  Filled 2018-01-31: qty 2

## 2018-01-31 MED ORDER — EPHEDRINE SULFATE-NACL 50-0.9 MG/10ML-% IV SOSY
PREFILLED_SYRINGE | INTRAVENOUS | Status: DC | PRN
  Administered 2018-01-31: 10 mg via INTRAVENOUS
  Administered 2018-01-31: 20 mg via INTRAVENOUS

## 2018-01-31 MED ORDER — PROMETHAZINE HCL 25 MG/ML IJ SOLN: 6.2500 mg | INTRAMUSCULAR | Status: DC | PRN

## 2018-01-31 MED ORDER — ACETAMINOPHEN 500 MG PO TABS
1000.0000 mg | ORAL_TABLET | ORAL | Status: AC
  Administered 2018-01-31: 1000 mg via ORAL
  Filled 2018-01-31: qty 2

## 2018-01-31 MED ORDER — CHLORHEXIDINE GLUCONATE CLOTH 2 % EX PADS: 6.0000 | MEDICATED_PAD | Freq: Once | CUTANEOUS | Status: DC

## 2018-01-31 MED ORDER — HYDROMORPHONE HCL 2 MG/ML IJ SOLN: 0.2500 mg | INTRAMUSCULAR | Status: DC | PRN

## 2018-01-31 MED ORDER — VANCOMYCIN HCL IN DEXTROSE 1-5 GM/200ML-% IV SOLN
1000.0000 mg | INTRAVENOUS | Status: AC
  Administered 2018-01-31: 1000 mg via INTRAVENOUS
  Filled 2018-01-31: qty 200

## 2018-01-31 MED ORDER — PROPOFOL 10 MG/ML IV BOLUS
INTRAVENOUS | Status: AC
  Filled 2018-01-31: qty 40

## 2018-01-31 MED ORDER — VANCOMYCIN HCL IN DEXTROSE 1-5 GM/200ML-% IV SOLN: 1000.0000 mg | INTRAVENOUS | Status: DC

## 2018-01-31 MED ORDER — GABAPENTIN 300 MG PO CAPS
300.0000 mg | ORAL_CAPSULE | ORAL | Status: AC
  Administered 2018-01-31: 300 mg via ORAL
  Filled 2018-01-31: qty 1

## 2018-01-31 MED ORDER — BUPIVACAINE-EPINEPHRINE 0.25% -1:200000 IJ SOLN
INTRAMUSCULAR | Status: AC
  Filled 2018-01-31: qty 2

## 2018-01-31 MED ORDER — HYDROCODONE-ACETAMINOPHEN 5-325 MG PO TABS: 1.0000 | ORAL_TABLET | Freq: Four times a day (QID) | ORAL | 0 refills | Status: DC | PRN

## 2018-01-31 MED ORDER — FENTANYL CITRATE (PF) 100 MCG/2ML IJ SOLN
INTRAMUSCULAR | Status: AC
  Filled 2018-01-31: qty 2

## 2018-01-31 SURGICAL SUPPLY — 44 items
APPLIER CLIP 9.375 MED OPEN (MISCELLANEOUS) ×3
BINDER BREAST MEDIUM (GAUZE/BANDAGES/DRESSINGS) ×3 IMPLANT
BLADE SURG 15 STRL LF DISP TIS (BLADE) ×1 IMPLANT
BLADE SURG 15 STRL SS (BLADE) ×2
CANISTER SUCT 3000ML PPV (MISCELLANEOUS) ×3 IMPLANT
CHLORAPREP W/TINT 26ML (MISCELLANEOUS) ×3 IMPLANT
CLIP APPLIE 9.375 MED OPEN (MISCELLANEOUS) ×1 IMPLANT
CONT SPEC 4OZ CLIKSEAL STRL BL (MISCELLANEOUS) ×3 IMPLANT
COVER PROBE W GEL 5X96 (DRAPES) ×3 IMPLANT
COVER SURGICAL LIGHT HANDLE (MISCELLANEOUS) ×3 IMPLANT
DERMABOND ADVANCED (GAUZE/BANDAGES/DRESSINGS) ×4
DERMABOND ADVANCED .7 DNX12 (GAUZE/BANDAGES/DRESSINGS) ×2 IMPLANT
DEVICE DUBIN SPECIMEN MAMMOGRA (MISCELLANEOUS) ×3 IMPLANT
DRAPE CHEST BREAST 15X10 FENES (DRAPES) ×3 IMPLANT
DRAPE UTILITY XL STRL (DRAPES) ×3 IMPLANT
ELECT COATED BLADE 2.86 ST (ELECTRODE) ×3 IMPLANT
ELECT REM PT RETURN 9FT ADLT (ELECTROSURGICAL) ×3
ELECTRODE REM PT RTRN 9FT ADLT (ELECTROSURGICAL) ×1 IMPLANT
GLOVE BIO SURGEON STRL SZ7.5 (GLOVE) ×6 IMPLANT
GOWN STRL REUS W/ TWL LRG LVL3 (GOWN DISPOSABLE) ×2 IMPLANT
GOWN STRL REUS W/TWL LRG LVL3 (GOWN DISPOSABLE) ×4
KIT BASIN OR (CUSTOM PROCEDURE TRAY) ×3 IMPLANT
KIT MARKER MARGIN INK (KITS) ×3 IMPLANT
LIGHT WAVEGUIDE WIDE FLAT (MISCELLANEOUS) IMPLANT
NDL SAFETY ECLIPSE 18X1.5 (NEEDLE) IMPLANT
NEEDLE FILTER BLUNT 18X 1/2SAF (NEEDLE)
NEEDLE FILTER BLUNT 18X1 1/2 (NEEDLE) IMPLANT
NEEDLE HYPO 18GX1.5 SHARP (NEEDLE)
NEEDLE HYPO 25GX1X1/2 BEV (NEEDLE) ×3 IMPLANT
NS IRRIG 1000ML POUR BTL (IV SOLUTION) ×3 IMPLANT
PACK SURGICAL SETUP 50X90 (CUSTOM PROCEDURE TRAY) ×3 IMPLANT
PENCIL BUTTON HOLSTER BLD 10FT (ELECTRODE) ×3 IMPLANT
SPONGE LAP 18X18 X RAY DECT (DISPOSABLE) ×3 IMPLANT
SUT MNCRL AB 4-0 PS2 18 (SUTURE) ×6 IMPLANT
SUT VIC AB 3-0 54X BRD REEL (SUTURE) ×1 IMPLANT
SUT VIC AB 3-0 BRD 54 (SUTURE) ×2
SUT VIC AB 3-0 SH 18 (SUTURE) ×3 IMPLANT
SYR BULB 3OZ (MISCELLANEOUS) ×3 IMPLANT
SYR CONTROL 10ML LL (SYRINGE) ×3 IMPLANT
TOWEL OR 17X24 6PK STRL BLUE (TOWEL DISPOSABLE) ×3 IMPLANT
TOWEL OR 17X26 10 PK STRL BLUE (TOWEL DISPOSABLE) ×3 IMPLANT
TUBE CONNECTING 12'X1/4 (SUCTIONS) ×1
TUBE CONNECTING 12X1/4 (SUCTIONS) ×2 IMPLANT
YANKAUER SUCT BULB TIP NO VENT (SUCTIONS) ×3 IMPLANT

## 2018-01-31 NOTE — Op Note (Signed)
01/31/2018  10:07 AM  PATIENT:  Cheryl Aguirre  72 y.o. female  PRE-OPERATIVE DIAGNOSIS:  RIGHT BREAST CANCER  POST-OPERATIVE DIAGNOSIS:  RIGHT BREAST CANCER  PROCEDURE:  Procedure(s): RIGHT BREAST LUMPECTOMY WITH RADIOACTIVE SEED AND DEEP RIGHT AXILLARY SENTINEL LYMPH NODE BIOPSY  SURGEON:  Surgeon(s) and Role:    * Jovita Kussmaul, MD - Primary  PHYSICIAN ASSISTANT:   ASSISTANTS: none   ANESTHESIA:   local and general  EBL:  25 mL   BLOOD ADMINISTERED:none  DRAINS: none   LOCAL MEDICATIONS USED:  MARCAINE     SPECIMEN:  Source of Specimen:  right breast tissue and sentinel node  DISPOSITION OF SPECIMEN:  PATHOLOGY  COUNTS:  YES  TOURNIQUET:  * No tourniquets in log *  DICTATION: .Dragon Dictation   After informed consent was obtained the patient was brought to the operating room and placed in the supine position on the operating table.  After adequate induction of general anesthesia the patient's right chest, breast, and axillary area were prepped with ChloraPrep, allowed to dry, and draped in usual sterile manner.  An appropriate timeout was performed.  Previously an I-125 seed was placed in the lower inner quadrant of the right breast to mark a small area of invasive breast cancer.  Earlier in the day the patient underwent injection of 1 mCi of technetium sulfur colloid in the subareolar position on the right breast.  The neoprobe was initially set to technetium.  An area of increased radioactivity was readily identified in the right axilla.  This area was infiltrated with quarter percent Marcaine.  A small transversely oriented incision was made with a 15 blade knife overlying the area of radioactivity.  The incision was carried through the skin and subcutaneous tissue sharply with the electrocautery and the dissection was carried into the deep right axillary space.  A [place] retractor was deployed.  Blunt hemostat dissection was carried out under the direction of the  neoprobe until I was able to identify a lymph node with increased radioactivity.  This lymph node was excised sharply with the electrocautery and the lymphatics were clamped with hemostats divided and ligated with 3-0 Vicryl ties.  Ex vivo counts on sentinel node #1 were approximately 1200.  No other hot or palpable lymph nodes were identified in the right axilla.  The axilla was examined and found to be hemostatic.  At this point the deep layer of the wound was closed with interrupted 3-0 Vicryl stitches.  The skin was closed with a running 4-0 Monocryl subcuticular stitch.  Attention was then turned to the right breast.  The neoprobe was set to I-125 in the area of the radioactive seed was readily identified in the lower inner right breast.  The area around this was infiltrated with quarter percent Marcaine.  A small incision was made along the inner inframammary fold with a 15 blade knife.  The incision was carried through the skin and subcutaneous tissue sharply with electrocautery until the chest wall was encountered.  The dissection was then carried superiorly along the chest wall separating the breast from the pectoralis muscle.  The dissection was then carried in the same direction between the breast tissue and subcutaneous fat and skin.  Once we were well beyond the area of the radioactive seed a circular portion of breast tissue was then excised sharply around the radioactive seed while checking the area of radioactivity frequently.  Once the specimen was removed it was oriented with the appropriate paint colors.  A specimen radiograph was obtained that showed the clip and seed to be near the center of the specimen.  The specimen was then sent to pathology for further evaluation.  Hemostasis was achieved using the Bovie electrocautery.  The cavity was marked with clips.  The wound was irrigated with saline.  The deep layer of the wound was then closed with layers of interrupted 3-0 Vicryl stitches.  The  skin was then closed with a running 4-0 Monocryl subcuticular stitch.  Dermabond dressings were applied.  The patient tolerated the procedure well.  At the end of the case all needle sponge and instrument counts were correct.  The patient was then awakened and taken to recovery in stable condition.  PLAN OF CARE: Discharge to home after PACU  PATIENT DISPOSITION:  PACU - hemodynamically stable.   Delay start of Pharmacological VTE agent (>24hrs) due to surgical blood loss or risk of bleeding: not applicable

## 2018-01-31 NOTE — Anesthesia Procedure Notes (Signed)
Procedure Name: Intubation Date/Time: 01/31/2018 8:55 AM Performed by: Leonor Liv, CRNA Pre-anesthesia Checklist: Patient identified, Emergency Drugs available, Suction available and Patient being monitored Patient Re-evaluated:Patient Re-evaluated prior to induction Oxygen Delivery Method: Circle System Utilized Preoxygenation: Pre-oxygenation with 100% oxygen Induction Type: IV induction Ventilation: Mask ventilation without difficulty Laryngoscope Size: Mac and 3 Grade View: Grade II Tube type: Oral Tube size: 7.0 mm Number of attempts: 1 Airway Equipment and Method: Stylet and Oral airway Placement Confirmation: ETT inserted through vocal cords under direct vision,  positive ETCO2 and breath sounds checked- equal and bilateral Secured at: 21 cm Tube secured with: Tape (left) Dental Injury: Teeth and Oropharynx as per pre-operative assessment

## 2018-01-31 NOTE — Anesthesia Procedure Notes (Signed)
Anesthesia Regional Block: Pectoralis block   Pre-Anesthetic Checklist: ,, timeout performed, Correct Patient, Correct Site, Correct Laterality, Correct Procedure, Correct Position, site marked, Risks and benefits discussed,  Surgical consent,  Pre-op evaluation,  At surgeon's request and post-op pain management  Laterality: Right  Prep: chloraprep       Needles:  Injection technique: Single-shot  Needle Type: Stimiplex     Needle Length: 9cm  Needle Gauge: 21     Additional Needles:   Procedures:,,,, ultrasound used (permanent image in chart),,,,  Narrative:  Start time: 01/31/2018 8:15 AM End time: 01/31/2018 8:20 AM Injection made incrementally with aspirations every 5 mL.  Performed by: Personally  Anesthesiologist: Lynda Rainwater, MD

## 2018-01-31 NOTE — Transfer of Care (Signed)
Immediate Anesthesia Transfer of Care Note  Patient: Cheryl Aguirre  Procedure(s) Performed: BREAST LUMPECTOMY WITH RADIOACTIVE SEED AND SENTINEL LYMPH NODE BIOPSY (Right Breast)  Patient Location: PACU  Anesthesia Type:General  Level of Consciousness: drowsy  Airway & Oxygen Therapy: Patient Spontanous Breathing and Patient connected to nasal cannula oxygen  Post-op Assessment: Report given to RN, Post -op Vital signs reviewed and stable and Patient moving all extremities  Post vital signs: Reviewed and stable  Last Vitals:  Vitals Value Taken Time  BP 130/64 01/31/2018 10:21 AM  Temp 36.3 C 01/31/2018 10:21 AM  Pulse 96 01/31/2018 10:24 AM  Resp 14 01/31/2018 10:24 AM  SpO2 92 % 01/31/2018 10:24 AM  Vitals shown include unvalidated device data.  Last Pain:  Vitals:   01/31/18 1021  PainSc: Asleep      Patients Stated Pain Goal: 2 (84/21/03 1281)  Complications: No apparent anesthesia complications

## 2018-01-31 NOTE — Anesthesia Preprocedure Evaluation (Signed)
Anesthesia Evaluation  Patient identified by MRN, date of birth, ID band Patient awake    Reviewed: Allergy & Precautions, H&P , NPO status , Patient's Chart, lab work & pertinent test results  Airway Mallampati: II  TM Distance: >3 FB Neck ROM: full    Dental no notable dental hx.    Pulmonary Current Smoker,    Pulmonary exam normal breath sounds clear to auscultation       Cardiovascular hypertension, Pt. on medications Normal cardiovascular exam Rhythm:Regular Rate:Normal     Neuro/Psych  Headaches, PSYCHIATRIC DISORDERS Anxiety Depression    GI/Hepatic GERD  ,  Endo/Other    Renal/GU      Musculoskeletal   Abdominal   Peds  Hematology   Anesthesia Other Findings Breast cancer  Reproductive/Obstetrics                             Anesthesia Physical  Anesthesia Plan  ASA: III  Anesthesia Plan: General   Post-op Pain Management:    Induction: Intravenous  PONV Risk Score and Plan: 2 and Ondansetron and Midazolam  Airway Management Planned: Oral ETT  Additional Equipment:   Intra-op Plan:   Post-operative Plan: Extubation in OR  Informed Consent: I have reviewed the patients History and Physical, chart, labs and discussed the procedure including the risks, benefits and alternatives for the proposed anesthesia with the patient or authorized representative who has indicated his/her understanding and acceptance.   Dental advisory given  Plan Discussed with: CRNA  Anesthesia Plan Comments:         Anesthesia Quick Evaluation

## 2018-01-31 NOTE — Anesthesia Postprocedure Evaluation (Signed)
Anesthesia Post Note  Patient: Cheryl Aguirre  Procedure(s) Performed: BREAST LUMPECTOMY WITH RADIOACTIVE SEED AND SENTINEL LYMPH NODE BIOPSY (Right Breast)     Patient location during evaluation: PACU Anesthesia Type: General Level of consciousness: awake and alert Pain management: pain level controlled Vital Signs Assessment: post-procedure vital signs reviewed and stable Respiratory status: spontaneous breathing, nonlabored ventilation and respiratory function stable Cardiovascular status: blood pressure returned to baseline and stable Postop Assessment: no apparent nausea or vomiting Anesthetic complications: no    Last Vitals:  Vitals:   01/31/18 1053 01/31/18 1100  BP: 93/82 100/82  Pulse: 90 91  Resp: 16 (!) 22  Temp:  (!) 36.3 C  SpO2: 98% 97%    Last Pain:  Vitals:   01/31/18 1100  PainSc: 2                  Lynda Rainwater

## 2018-01-31 NOTE — Interval H&P Note (Signed)
History and Physical Interval Note:  01/31/2018 8:31 AM  Cheryl Aguirre  has presented today for surgery, with the diagnosis of RIGHT BREAST CANCER  The various methods of treatment have been discussed with the patient and family. After consideration of risks, benefits and other options for treatment, the patient has consented to  Procedure(s): BREAST LUMPECTOMY WITH RADIOACTIVE SEED AND SENTINEL LYMPH NODE BIOPSY (Right) as a surgical intervention .  The patient's history has been reviewed, patient examined, no change in status, stable for surgery.  I have reviewed the patient's chart and labs.  Questions were answered to the patient's satisfaction.     TOTH III,PAUL S

## 2018-01-31 NOTE — Discharge Instructions (Signed)

## 2018-01-31 NOTE — H&P (Signed)
Cheryl Aguirre  Location: [facility] Patient #: [id number] DOB: [birth year] Married / Language: English / Race: White Female   History of Present Illness  The patient is a 72 year old female who presents with breast cancer. We're asked to see the patient in consultation by Dr. Michiel Cowboy to evaluate her for a new right breast cancer. The patient is a 72 year old white female who was first told of an abnormality in the medial right breast about 1-2 years ago. The mass has not changed very much over that time but became a little bit less distinct. The mass measured 4 mm. The mass was biopsied and came back as an invasive answer that was HER-2 negative. The rest of the tumor markers are pending. She denies any significant breast pain or discharge from the nipple. She does have a history of breast cancer in her sister, maternal aunt, and a maternal first cousin. She does not smoke.   Past Surgical History  Breast Biopsy  Right. Cataract Surgery  Bilateral. Colon Polyp Removal - Colonoscopy  Hysterectomy (not due to cancer) - Complete   Diagnostic Studies History  Colonoscopy  1-5 years ago Mammogram  within last year Pap Smear  >5 years ago  Allergies  Penicillins  Allergies Reconciled   Medication History  Escitalopram Oxalate (20MG Tablet, Oral) Active. Montelukast Sodium (10MG Tablet, Oral) Active. Calcium (Oral) Specific strength unknown - Active. ZyrTEC Allergy (10MG Tablet, Oral) Active. Vitamin D (Oral) Specific strength unknown - Active. Fish Oil (Oral) Specific strength unknown - Active. Magnesium (250MG Tablet, Oral) Active. Melatonin (1MG Tablet, Oral) Active. Multi-Vitamin (Oral) Active. Medications Reconciled  Social History  Alcohol use  Occasional alcohol use. Caffeine use  Coffee, Tea. No drug use  Tobacco use  Current every day smoker.  Family History  Breast Cancer  Sister. Cancer  Brother. Colon Cancer   Father. Colon Polyps  Brother, Sister. Depression  Sister. Heart disease in female family member before age 20  Malignant Neoplasm Of Pancreas  Mother. Melanoma  Sister. Respiratory Condition  Father. Thyroid problems  Sister.  Pregnancy / Birth History Age at menarche  85 years. Age of menopause  59-50 Gravida  1 Irregular periods  Length (months) of breastfeeding  3-6 Maternal age  4-20 Para  1  Other Problems  Anxiety Disorder  Arthritis  Depression  Diverticulosis  Gastroesophageal Reflux Disease  Hemorrhoids  Migraine Headache  Oophorectomy     Review of Systems  General Present- Fatigue, Night Sweats and Weight Loss. Not Present- Appetite Loss, Chills, Fever and Weight Gain. Skin Present- Dryness, Hives and Non-Healing Wounds. Not Present- Change in Wart/Mole, Jaundice, New Lesions, Rash and Ulcer. HEENT Present- Ringing in the Ears, Seasonal Allergies and Sinus Pain. Not Present- Earache, Hearing Loss, Hoarseness, Nose Bleed, Oral Ulcers, Sore Throat, Visual Disturbances, Wears glasses/contact lenses and Yellow Eyes. Respiratory Present- Snoring. Not Present- Bloody sputum, Chronic Cough, Difficulty Breathing and Wheezing. Breast Present- Breast Pain. Not Present- Breast Mass, Nipple Discharge and Skin Changes. Cardiovascular Present- Leg Cramps and Swelling of Extremities. Not Present- Chest Pain, Difficulty Breathing Lying Down, Palpitations, Rapid Heart Rate and Shortness of Breath. Gastrointestinal Present- Change in Bowel Habits and Hemorrhoids. Not Present- Abdominal Pain, Bloating, Bloody Stool, Chronic diarrhea, Constipation, Difficulty Swallowing, Excessive gas, Gets full quickly at meals, Indigestion, Nausea, Rectal Pain and Vomiting. Female Genitourinary Present- Frequency and Urgency. Not Present- Nocturia, Painful Urination and Pelvic Pain. Musculoskeletal Present- Joint Pain, Joint Stiffness and Muscle Pain. Not Present- Back Pain,  Muscle Weakness and Swelling of Extremities. Neurological Present- Headaches. Not Present- Decreased Memory, Fainting, Numbness, Seizures, Tingling, Tremor, Trouble walking and Weakness. Psychiatric Present- Anxiety and Fearful. Not Present- Bipolar, Change in Sleep Pattern, Depression and Frequent crying. Endocrine Present- Heat Intolerance and Hot flashes. Not Present- Cold Intolerance, Excessive Hunger, Hair Changes and New Diabetes. Hematology Present- Easy Bruising. Not Present- Blood Thinners, Excessive bleeding, Gland problems, HIV and Persistent Infections.  Vitals  Weight: 121.4 lb Height: 63in Body Surface Area: 1.56 m Body Mass Index: 21.5 kg/m  Temp.: 98.57F  Pulse: 67 (Regular)  BP: 118/72 (Sitting, Left Arm, Standard)       Physical Exam General Mental Status-Alert. General Appearance-Consistent with stated age. Hydration-Well hydrated. Voice-Normal.  Head and Neck Head-normocephalic, atraumatic with no lesions or palpable masses. Trachea-midline. Thyroid Gland Characteristics - normal size and consistency.  Eye Eyeball - Bilateral-Extraocular movements intact. Sclera/Conjunctiva - Bilateral-No scleral icterus.  Chest and Lung Exam Chest and lung exam reveals -quiet, even and easy respiratory effort with no use of accessory muscles and on auscultation, normal breath sounds, no adventitious sounds and normal vocal resonance. Inspection Chest Wall - Normal. Back - normal.  Breast Note: There is no palpable mass in either breast. There is no palpable axillary, supraclavicular, or cervical lymphadenopathy.   Cardiovascular Cardiovascular examination reveals -normal heart sounds, regular rate and rhythm with no murmurs and normal pedal pulses bilaterally.  Abdomen Inspection Inspection of the abdomen reveals - No Hernias. Skin - Scar - no surgical scars. Palpation/Percussion Palpation and Percussion of the abdomen reveal -  Soft, Non Tender, No Rebound tenderness, No Rigidity (guarding) and No hepatosplenomegaly. Auscultation Auscultation of the abdomen reveals - Bowel sounds normal.  Neurologic Neurologic evaluation reveals -alert and oriented x 3 with no impairment of recent or remote memory. Mental Status-Normal.  Musculoskeletal Normal Exam - Left-Upper Extremity Strength Normal and Lower Extremity Strength Normal. Normal Exam - Right-Upper Extremity Strength Normal and Lower Extremity Strength Normal.  Lymphatic Head & Neck  General Head & Neck Lymphatics: Bilateral - Description - Normal. Axillary  General Axillary Region: Bilateral - Description - Normal. Tenderness - Non Tender. Femoral & Inguinal  Generalized Femoral & Inguinal Lymphatics: Bilateral - Description - Normal. Tenderness - Non Tender.    Assessment & Plan MALIGNANT NEOPLASM OF LOWER-INNER QUADRANT OF RIGHT FEMALE BREAST, UNSPECIFIED ESTROGEN RECEPTOR STATUS (C50.311) Impression: The patient appears to have a very small stage I cancer in the lower inner right breast. I have talked her in detail about the different options for treatment and at this point she favors breast conservation. I think this is a very reasonable way of getting rid of her breast cancer. She is also a good candidate for sentinel node mapping since her nodes are clinically negative. She will require a radioactive seed localization since the cancer is not palpable. I have discussed with her in detail the risks and benefits of the operation as well as some of the technical aspects and she understands and wishes to proceed. I will also go ahead and make her referrals to medical and radiation oncology to talk about adjuvant therapy and possible genetic testing. Current Plans Referred to Oncology, for evaluation and follow up (Oncology). Routine. Pt Education - Breast Cancer: discussed with patient and provided information. Referred to Radiation Oncology, for  evaluation and follow up (Radiation Oncology). Routine.

## 2018-02-01 ENCOUNTER — Encounter (HOSPITAL_COMMUNITY): Payer: Self-pay | Admitting: General Surgery

## 2018-02-08 ENCOUNTER — Telehealth: Payer: Self-pay | Admitting: Hematology and Oncology

## 2018-02-08 ENCOUNTER — Encounter: Payer: Self-pay | Admitting: *Deleted

## 2018-02-08 ENCOUNTER — Inpatient Hospital Stay: Payer: Medicare Other | Attending: Hematology and Oncology | Admitting: Hematology and Oncology

## 2018-02-08 DIAGNOSIS — Z170 Estrogen receptor positive status [ER+]: Secondary | ICD-10-CM | POA: Diagnosis not present

## 2018-02-08 DIAGNOSIS — C50311 Malignant neoplasm of lower-inner quadrant of right female breast: Secondary | ICD-10-CM | POA: Diagnosis not present

## 2018-02-08 DIAGNOSIS — I10 Essential (primary) hypertension: Secondary | ICD-10-CM | POA: Insufficient documentation

## 2018-02-08 DIAGNOSIS — M8580 Other specified disorders of bone density and structure, unspecified site: Secondary | ICD-10-CM | POA: Insufficient documentation

## 2018-02-08 NOTE — Telephone Encounter (Signed)
Gave avs and calendar ° °

## 2018-02-08 NOTE — Assessment & Plan Note (Signed)
01/31/2018: Right lumpectomy: IDC with DCIS 0.4 cm, margins negative, 0/2 lymph nodes negative, grade 2, ER 100%, PR 100%, HER-2 negative ratio 1.19, T1 a N0 stage I a  Pathology counseling: I discussed the final pathology report of the patient provided  a copy of this report. I discussed the margins as well as lymph node surgeries. We also discussed the final staging along with previously performed ER/PR and HER-2/neu testing.  Recommendation: 1. Adjuvant radiation therapy  2. followed by adjuvant antiestrogen therapy with letrozole 2.5 mg daily x5 years  Letrozole counseling:We discussed the risks and benefits of anti-estrogen therapy with aromatase inhibitors. These include but not limited to insomnia, hot flashes, mood changes, vaginal dryness, bone density loss, and weight gain. We strongly believe that the benefits far outweigh the risks. Patient understands these risks and consented to starting treatment. Planned treatment duration is 5 years.  Return to clinic after radiation therapy to begin antiestrogen therapy

## 2018-02-08 NOTE — Progress Notes (Signed)
[facility] NOTE  Patient Care Team: Tower, Wynelle Fanny, MD as PCP - General Leandrew Koyanagi, MD as Referring Physician (Ophthalmology) Mauri Pole, MD as Consulting Physician (Gastroenterology)  CHIEF COMPLAINTS/PURPOSE OF CONSULTATION:  Newly diagnosed breast cancer  HISTORY OF PRESENTING ILLNESS:  Cheryl Aguirre 72 y.o. female is here because of recent diagnosis of right breast cancer.  She had a routine screening mammogram that detected abnormality in the right breast and ultrasound was performed which revealed at 3:30 position 5 cm from the nipple 4 mm area of hypoechoic mass.  Right axillary ultrasound was negative.  Biopsy of this mass was positive for ER PR negative HER-2.  She underwent right lumpectomy on 01/31/2018 and is referred to [place] to discuss adjuvant treatment options.  She is healing very well from the recent surgery.  I reviewed her records extensively and collaborated the history with the patient.  SUMMARY OF ONCOLOGIC HISTORY:   Malignant neoplasm of lower-inner quadrant of right breast of female, estrogen receptor positive ([place])   01/31/2018 Initial Diagnosis    Right lumpectomy: IDC with DCIS 0.4 cm, margins negative, 0/2 lymph nodes negative, grade 2, ER 100%, PR 100%, HER-2 negative ratio 1.19, T1 a N0 stage I a       MEDICAL HISTORY:  Past Medical History:  Diagnosis Date  . Anxiety   . Arthritis    left hip  . Chronic headaches   . Colon polyps   . Depression   . GERD (gastroesophageal reflux disease)   . Hypertension    elevated at times.  no meds  . IBS (irritable bowel syndrome)   . Insomnia   . Osteopenia   . Tobacco abuse     SURGICAL HISTORY: Past Surgical History:  Procedure Laterality Date  . ABDOMINAL HYSTERECTOMY     bleeding, fibroid, endometriosis (total)  . BREAST LUMPECTOMY WITH RADIOACTIVE SEED AND SENTINEL LYMPH NODE BIOPSY Right 01/31/2018   Procedure: BREAST LUMPECTOMY WITH RADIOACTIVE SEED  AND SENTINEL LYMPH NODE BIOPSY;  Surgeon: Jovita Kussmaul, MD;  Location: [facility];  Service: General;  Laterality: Right;  . CATARACT EXTRACTION W/PHACO Left 02/11/2017   Procedure: CATARACT EXTRACTION PHACO AND INTRAOCULAR LENS PLACEMENT (IOC);  Surgeon: Leandrew Koyanagi, MD;  Location: [facility];  Service: Ophthalmology;  Laterality: Left;  [place] 1:02.8 AP% 13.7 CDE 8.58 FLUID PACK LOT # [id number]  . CATARACT EXTRACTION W/PHACO Right 03/10/2017   Procedure: CATARACT EXTRACTION PHACO AND INTRAOCULAR LENS PLACEMENT ([place]) Right symfony toric lens;  Surgeon: Leandrew Koyanagi, MD;  Location: [facility];  Service: Ophthalmology;  Laterality: Right;  symfony toric lens prefers early  . CT of chest  10/09   bronchiectasis  . ENDOMETRIAL BIOPSY    . LAPAROSCOPY  1984    SOCIAL HISTORY: Social History   Socioeconomic History  . Marital status: Married    Spouse name: Not on file  . Number of children: 1  . Years of education: Not on file  . Highest education level: Not on file  Occupational History  . Occupation: Merchandiser, retail: UNEMPLOYED  Social Needs  . Financial resource strain: Not on file  . Food insecurity:    Worry: Not on file    Inability: Not on file  . Transportation needs:    Medical: Not on file    Non-medical: Not on file  Tobacco Use  . Smoking status: Current Every Day Smoker    Packs/day: 1.00    Years: 56.00  Pack years: 56.00    Types: Cigarettes  . Smokeless tobacco: Never Used  Substance and Sexual Activity  . Alcohol use: Yes    Alcohol/week: 1.2 oz    Types: 1 Glasses of wine, 1 Cans of beer per week    Comment:    . Drug use: No  . Sexual activity: Yes  Lifestyle  . Physical activity:    Days per week: Not on file    Minutes per session: Not on file  . Stress: Not on file  Relationships  . Social connections:    Talks on phone: Not on file    Gets together: Not on file    Attends religious service: Not on file    Active member  of club or organization: Not on file    Attends meetings of clubs or organizations: Not on file    Relationship status: Not on file  . Intimate partner violence:    Fear of current or ex partner: Not on file    Emotionally abused: Not on file    Physically abused: Not on file    Forced sexual activity: Not on file  Other Topics Concern  . Not on file  Social History Narrative   Daily Caffeine Use:  2 daily    FAMILY HISTORY: Family History  Problem Relation Age of Onset  . Heart disease Other   . Cancer Mother        pancreatic, liver  . Cancer Father        colon  . Colon cancer Father 65  . Thyroid disease Sister   . Colon polyps Sister   . Irritable bowel syndrome Sister   . Breast cancer Sister   . Cancer Sister   . Cancer Maternal Aunt        breast  . Breast cancer Maternal Aunt   . Cancer Paternal Grandmother        thyroid  . Miscarriages / [place] Cousin   . Cancer Cousin        breast  . Diabetes Cousin   . Thyroid disease Sister     ALLERGIES:  is allergic to adhesive [tape]; alendronate sodium; penicillins; varenicline tartrate; and doxycycline.  MEDICATIONS:  Current Outpatient Medications  Medication Sig Dispense Refill  . Calcium Citrate-Vitamin D (CITRACAL/VITAMIN D) 250-200 MG-UNIT TABS Take 4 tablets by mouth daily.     . cetirizine (ZYRTEC) 10 MG tablet Take 10 mg by mouth daily.      . cholecalciferol (VITAMIN D) 1000 units tablet Take 1 tablet by mouth daily.     . cyclobenzaprine (FLEXERIL) 10 MG tablet Take 1 tablet (10 mg total) by mouth at bedtime. (Patient taking differently: Take 10 mg by mouth daily as needed (migraine). ) 5 tablet 0  . escitalopram (LEXAPRO) 20 MG tablet Take 1 tablet (20 mg total) by mouth daily. 90 tablet 3  . fish oil-omega-3 fatty acids 1000 MG capsule Take 1 g by mouth daily.     Marland Kitchen ibuprofen (ADVIL,MOTRIN) 200 MG tablet Take 200 mg by mouth every 4 (four) hours as needed for headache.    . Lactobacillus  (ACIDOPHILUS) 100 MG CAPS Take 1 capsule by mouth daily.      . Magnesium 250 MG TABS Take 1 tablet by mouth daily as needed (constipation).     . Melatonin 5 MG TABS Take 5 mg by mouth at bedtime. As directed at bedtime.     . montelukast (SINGULAIR) 10 MG tablet Take 1 tablet (  10 mg total) by mouth at bedtime. 90 tablet 3  . Multiple Vitamin (MULTIVITAMIN) tablet Take 1 tablet by mouth daily.      Marland Kitchen Phenylephrine-Bromphen-DM (PHENYLEPHRINE COMPLEX PO) Take 1 tablet by mouth every 4 (four) hours as needed (allergies). Take 1 tablet up to every 4 hours as needed     . SUMAtriptan (IMITREX) 50 MG tablet Take 50 mg by mouth daily as needed. May repeat in 2 hours if headache persists or recurs.      No current facility-administered medications for this visit.     REVIEW OF SYSTEMS:   Constitutional: Denies fevers, chills or abnormal night sweats Eyes: Denies blurriness of vision, double vision or watery eyes Ears, nose, mouth, throat, and face: Denies mucositis or sore throat Respiratory: Denies cough, dyspnea or wheezes Cardiovascular: Denies palpitation, chest discomfort or lower extremity swelling Gastrointestinal:  Denies nausea, heartburn or change in bowel habits Skin: Denies abnormal skin rashes Lymphatics: Denies new lymphadenopathy or easy bruising Neurological:Denies numbness, tingling or new weaknesses Behavioral/Psych: Mood is stable, no new changes  Breast: Recent right lumpectomy All other systems were reviewed with the patient and are negative.  PHYSICAL EXAMINATION: ECOG PERFORMANCE STATUS: 1 - Symptomatic but completely ambulatory  Vitals:   02/08/18 1302  BP: 139/69  Pulse: 80  Resp: 20  Temp: 98.4 F (36.9 C)  SpO2: 98%   Filed Weights   02/08/18 1302  Weight: 121 lb 1.6 oz (54.9 kg)    GENERAL:alert, no distress and comfortable SKIN: skin color, texture, turgor are normal, no rashes or significant lesions EYES: normal, conjunctiva are pink and  non-injected, sclera clear OROPHARYNX:no exudate, no erythema and lips, buccal mucosa, and tongue normal  NECK: supple, thyroid normal size, non-tender, without nodularity LYMPH:  no palpable lymphadenopathy in the cervical, axillary or inguinal LUNGS: clear to auscultation and percussion with normal breathing effort HEART: regular rate & rhythm and no murmurs and no lower extremity edema ABDOMEN:abdomen soft, non-tender and normal bowel sounds Musculoskeletal:no cyanosis of digits and no clubbing  PSYCH: alert & oriented x 3 with fluent speech NEURO: no focal motor/sensory deficits  LABORATORY DATA:  I have reviewed the data as listed Lab Results  Component Value Date   WBC 7.7 01/21/2018   HGB 14.8 01/21/2018   HCT 43.8 01/21/2018   MCV 93.2 01/21/2018   PLT 240 01/21/2018   Lab Results  Component Value Date   NA 138 01/21/2018   K 3.9 01/21/2018   CL 102 01/21/2018   CO2 25 01/21/2018    RADIOGRAPHIC STUDIES: I have personally reviewed the radiological reports and agreed with the findings in the report.  ASSESSMENT AND PLAN:  Malignant neoplasm of lower-inner quadrant of right breast of female, estrogen receptor positive ([place]) 01/31/2018: Right lumpectomy: IDC with DCIS 0.4 cm, margins negative, 0/2 lymph nodes negative, grade 2, ER 100%, PR 100%, HER-2 negative ratio 1.19, T1 a N0 stage I a  Pathology counseling: I discussed the final pathology report of the patient provided  a copy of this report. I discussed the margins as well as lymph node surgeries. We also discussed the final staging along with previously performed ER/PR and HER-2/neu testing.  Recommendation: 1. Adjuvant radiation therapy  2. followed by adjuvant antiestrogen therapy with letrozole 2.5 mg daily x5 years  I recommended genetic counseling because her mother had pancreatic cancer her sister had breast cancer as well as couple of onset and cousins had breast cancers.  Letrozole counseling:We  discussed the risks and  benefits of anti-estrogen therapy with aromatase inhibitors. These include but not limited to insomnia, hot flashes, mood changes, vaginal dryness, bone density loss, and weight gain. We strongly believe that the benefits far outweigh the risks. Patient understands these risks and consented to starting treatment. Planned treatment duration is 5 years.  Return to clinic after radiation therapy to begin antiestrogen therapy  All questions were answered. The patient knows to call the clinic with any problems, questions or concerns.    Harriette Ohara, MD 02/08/18

## 2018-02-14 NOTE — Progress Notes (Signed)
Location of Breast Cancer: Malignant neoplasm of lower-inner quadrant of right breast of female, estrogen receptor positive.  Right Breast Ultrasound: 3:30 position 5 cm from the nipple 4 mm area of hypoechoic mass.  Right axillary ultrasound was negative.  Histology per Pathology Report: Right Breast 01/31/2018   Receptor Status: ER(+ 100%), PR (+ 100%), Her2-neu (-), Ki-()  Did patient present with symptoms (if so, please note symptoms) or was this found on screening mammography?: Routine mammogram detected abnormality in the right breast.  Past/Anticipated interventions by surgeon, if any: Right Lumpectomy 01/31/2018 Dr. Marlou Starks Initial Diagnosis      Right lumpectomy: IDC with DCIS 0.4 cm, margins negative, 0/2 lymph nodes negative, grade 2, ER 100%, PR 100%, HER-2 negative ratio 1.19, T1 a N0 stage I a    Past/Anticipated interventions by medical oncology, if any: Chemotherapy. Dr. Lindi Adie 02/08/2018 1. Adjuvant radiation therapy  2. followed by adjuvant antiestrogen therapy with letrozole 2.5 mg daily x5 years 3. I recommended genetic counseling because her mother had pancreatic cancer her sister had breast cancer as well as couple of onset and cousins had breast cancers. 4. Return to clinic after radiation therapy to begin antiestrogen therapy.  Lymphedema issues, if any: Right underarm  Pain issues, if any:  8/10 pain associated with lymphedema  BP (!) 141/87   Pulse 79   Temp 97.6 F (36.4 C)   Resp 18   Ht 5' 3" (1.6 m)   Wt 119 lb (54 kg)   SpO2 100%   BMI 21.08 kg/m    Wt Readings from Last 3 Encounters:  02/15/18 119 lb (54 kg)  02/08/18 121 lb 1.6 oz (54.9 kg)  01/21/18 121 lb 12.8 oz (55.2 kg)   SAFETY ISSUES:  Prior radiation? No  Pacemaker/ICD? No  Possible current pregnancy? No, Abdominal hysterectomy  Is the patient on methotrexate? No   Current Complaints / other details:  Questions about length of treatment and if a compression sleeve would be  beneficial.    Cori Razor, RN 02/14/2018,11:21 AM

## 2018-02-15 ENCOUNTER — Other Ambulatory Visit: Payer: Self-pay

## 2018-02-15 ENCOUNTER — Encounter: Payer: Self-pay | Admitting: Radiation Oncology

## 2018-02-15 ENCOUNTER — Ambulatory Visit
Admission: RE | Admit: 2018-02-15 | Discharge: 2018-02-15 | Disposition: A | Discharge status: Home / Self Care | Payer: Medicare Other | Source: Ambulatory Visit | Attending: Radiation Oncology | Admitting: Radiation Oncology

## 2018-02-15 ENCOUNTER — Telehealth: Payer: Self-pay | Admitting: *Deleted

## 2018-02-15 VITALS — BP 141/87 | HR 79 | Temp 97.6°F | Resp 18 | Ht 63.0 in | Wt 119.0 lb

## 2018-02-15 DIAGNOSIS — Z800 Family history of malignant neoplasm of digestive organs: Secondary | ICD-10-CM | POA: Insufficient documentation

## 2018-02-15 DIAGNOSIS — R222 Localized swelling, mass and lump, trunk: Secondary | ICD-10-CM | POA: Diagnosis not present

## 2018-02-15 DIAGNOSIS — F418 Other specified anxiety disorders: Secondary | ICD-10-CM | POA: Insufficient documentation

## 2018-02-15 DIAGNOSIS — F17210 Nicotine dependence, cigarettes, uncomplicated: Secondary | ICD-10-CM | POA: Diagnosis not present

## 2018-02-15 DIAGNOSIS — M8580 Other specified disorders of bone density and structure, unspecified site: Secondary | ICD-10-CM | POA: Diagnosis not present

## 2018-02-15 DIAGNOSIS — C50311 Malignant neoplasm of lower-inner quadrant of right female breast: Secondary | ICD-10-CM

## 2018-02-15 DIAGNOSIS — I10 Essential (primary) hypertension: Secondary | ICD-10-CM | POA: Diagnosis not present

## 2018-02-15 DIAGNOSIS — K589 Irritable bowel syndrome without diarrhea: Secondary | ICD-10-CM | POA: Diagnosis not present

## 2018-02-15 DIAGNOSIS — F419 Anxiety disorder, unspecified: Secondary | ICD-10-CM | POA: Diagnosis not present

## 2018-02-15 DIAGNOSIS — Z79899 Other long term (current) drug therapy: Secondary | ICD-10-CM | POA: Diagnosis not present

## 2018-02-15 DIAGNOSIS — Z809 Family history of malignant neoplasm, unspecified: Secondary | ICD-10-CM | POA: Diagnosis not present

## 2018-02-15 DIAGNOSIS — Z923 Personal history of irradiation: Secondary | ICD-10-CM | POA: Diagnosis not present

## 2018-02-15 DIAGNOSIS — Z170 Estrogen receptor positive status [ER+]: Secondary | ICD-10-CM | POA: Insufficient documentation

## 2018-02-15 DIAGNOSIS — Z853 Personal history of malignant neoplasm of breast: Secondary | ICD-10-CM | POA: Diagnosis not present

## 2018-02-15 DIAGNOSIS — M129 Arthropathy, unspecified: Secondary | ICD-10-CM | POA: Diagnosis not present

## 2018-02-15 DIAGNOSIS — Z803 Family history of malignant neoplasm of breast: Secondary | ICD-10-CM | POA: Diagnosis not present

## 2018-02-15 DIAGNOSIS — G4700 Insomnia, unspecified: Secondary | ICD-10-CM | POA: Diagnosis not present

## 2018-02-15 DIAGNOSIS — K219 Gastro-esophageal reflux disease without esophagitis: Secondary | ICD-10-CM | POA: Insufficient documentation

## 2018-02-15 DIAGNOSIS — Z86010 Personal history of colonic polyps: Secondary | ICD-10-CM | POA: Insufficient documentation

## 2018-02-15 NOTE — Progress Notes (Signed)
Radiation Oncology         [phone] ________________________________  Name: Cheryl Aguirre        MRN: [id number]  Date of Service: 02/15/2018 DOB: 20-Sep-1946  CC:Tower, Wynelle Fanny, MD  Tower, Wynelle Fanny, MD     REFERRING PHYSICIAN: Tower, Wynelle Fanny, MD   DIAGNOSIS: The encounter diagnosis was Malignant neoplasm of lower-inner quadrant of right breast of female, estrogen receptor positive ([place]).   HISTORY OF PRESENT ILLNESS: Cheryl Aguirre is a 72 y.o. female seen in the multidisciplinary breast clinic for a new diagnosis of right breast cancer. The patient was noted to have a screening detected mass in the 3:30 position on mammogram. She proceeded with diagnostic imaging that measured this site at 4 mm. She underwent biopsy that revealed a ER/PR positive invasive ductal carcinoma and on 01/31/18 underwent right lumpectomy with sentinel node biopsy. Final pathology revealed a grade 2, 4 mm invasive ductal carcinoma with DCIS, and 2 of the sampled nodes were both negative, her margins were clear. She does not need chemotherapy and comes today to discuss options of adjuvant radiotherapy.    PREVIOUS RADIATION THERAPY: No   PAST MEDICAL HISTORY:  Past Medical History:  Diagnosis Date  . Anxiety   . Arthritis    left hip  . Chronic headaches   . Colon polyps   . Depression   . GERD (gastroesophageal reflux disease)   . Hypertension    elevated at times.  no meds  . IBS (irritable bowel syndrome)   . Insomnia   . Osteopenia   . Tobacco abuse        PAST SURGICAL HISTORY: Past Surgical History:  Procedure Laterality Date  . ABDOMINAL HYSTERECTOMY     bleeding, fibroid, endometriosis (total)  . BREAST LUMPECTOMY WITH RADIOACTIVE SEED AND SENTINEL LYMPH NODE BIOPSY Right 01/31/2018   Procedure: BREAST LUMPECTOMY WITH RADIOACTIVE SEED AND SENTINEL LYMPH NODE BIOPSY;  Surgeon: Jovita Kussmaul, MD;  Location: [place];  Service: General;  Laterality: Right;  . CATARACT EXTRACTION  W/PHACO Left 02/11/2017   Procedure: CATARACT EXTRACTION PHACO AND INTRAOCULAR LENS PLACEMENT (IOC);  Surgeon: Leandrew Koyanagi, MD;  Location: [facility];  Service: Ophthalmology;  Laterality: Left;  [place] 1:02.8 AP% 13.7 CDE 8.58 FLUID PACK LOT # [id number]  . CATARACT EXTRACTION W/PHACO Right 03/10/2017   Procedure: CATARACT EXTRACTION PHACO AND INTRAOCULAR LENS PLACEMENT ([place]) Right symfony toric lens;  Surgeon: Leandrew Koyanagi, MD;  Location: [place];  Service: Ophthalmology;  Laterality: Right;  symfony toric lens prefers early  . CT of chest  10/09   bronchiectasis  . ENDOMETRIAL BIOPSY    . LAPAROSCOPY  1984     FAMILY HISTORY:  Family History  Problem Relation Age of Onset  . Heart disease Other   . Cancer Mother        pancreatic, liver  . Cancer Father        colon  . Colon cancer Father 27  . Thyroid disease Sister   . Colon polyps Sister   . Irritable bowel syndrome Sister   . Breast cancer Sister   . Cancer Sister   . Cancer Maternal Aunt        breast  . Breast cancer Maternal Aunt   . Cancer Paternal Grandmother        thyroid  . Miscarriages / [place] Cousin   . Cancer Cousin        breast  . Diabetes Cousin   . Thyroid  disease Sister      SOCIAL HISTORY:  reports that she has been smoking cigarettes.  She has a 56.00 pack-year smoking history. She has never used smokeless tobacco. She reports that she drinks about 1.2 oz of alcohol per week. She reports that she does not use drugs. The patient is married and lives in [place].   ALLERGIES: Adhesive [tape]; Alendronate sodium; Penicillins; Varenicline tartrate; and Doxycycline   MEDICATIONS:  Current Outpatient Medications  Medication Sig Dispense Refill  . Calcium Citrate-Vitamin D (CITRACAL/VITAMIN D) 250-200 MG-UNIT TABS Take 4 tablets by mouth daily.     . cetirizine (ZYRTEC) 10 MG tablet Take 10 mg by mouth daily.      . cholecalciferol (VITAMIN D) 1000 units tablet Take 1  tablet by mouth daily.     . cyclobenzaprine (FLEXERIL) 10 MG tablet Take 1 tablet (10 mg total) by mouth at bedtime. (Patient taking differently: Take 10 mg by mouth daily as needed (migraine). ) 5 tablet 0  . escitalopram (LEXAPRO) 20 MG tablet Take 1 tablet (20 mg total) by mouth daily. 90 tablet 3  . fish oil-omega-3 fatty acids 1000 MG capsule Take 1 g by mouth daily.     Marland Kitchen ibuprofen (ADVIL,MOTRIN) 200 MG tablet Take 200 mg by mouth every 4 (four) hours as needed for headache.    . Lactobacillus (ACIDOPHILUS) 100 MG CAPS Take 1 capsule by mouth daily.      . Magnesium 250 MG TABS Take 1 tablet by mouth daily as needed (constipation).     . Melatonin 5 MG TABS Take 5 mg by mouth at bedtime. As directed at bedtime.     . montelukast (SINGULAIR) 10 MG tablet Take 1 tablet (10 mg total) by mouth at bedtime. 90 tablet 3  . Multiple Vitamin (MULTIVITAMIN) tablet Take 1 tablet by mouth daily.      Marland Kitchen Phenylephrine-Bromphen-DM (PHENYLEPHRINE COMPLEX PO) Take 1 tablet by mouth every 4 (four) hours as needed (allergies). Take 1 tablet up to every 4 hours as needed     . SUMAtriptan (IMITREX) 50 MG tablet Take 50 mg by mouth daily as needed. May repeat in 2 hours if headache persists or recurs.      No current facility-administered medications for this encounter.      REVIEW OF SYSTEMS: On review of systems, the patient reports that she is doing well overall. She denies any chest pain, shortness of breath, cough, fevers, chills, night sweats, unintended weight changes. She denies any bowel or bladder disturbances, and denies abdominal pain, nausea or vomiting. She denies any new musculoskeletal or joint aches or pains. A complete review of systems is obtained and is otherwise negative.     PHYSICAL EXAM:  Wt Readings from Last 3 Encounters:  02/15/18 119 lb (54 kg)  02/08/18 121 lb 1.6 oz (54.9 kg)  01/21/18 121 lb 12.8 oz (55.2 kg)   Temp Readings from Last 3 Encounters:  02/15/18 97.6 F (36.4  C)  02/08/18 98.4 F (36.9 C) (Oral)  01/31/18 (!) 97.4 F (36.3 C)   BP Readings from Last 3 Encounters:  02/15/18 (!) 141/87  02/08/18 139/69  01/31/18 (!) 120/56   Pulse Readings from Last 3 Encounters:  02/15/18 79  02/08/18 80  01/31/18 83     In general this is a well appearing caucasian female in no acute distress. She is alert and oriented x4 and appropriate throughout the examination. HEENT reveals that the patient is normocephalic, atraumatic. EOMs are intact. Skin  is intact without any evidence of gross lesions. Cardiopulmonary assessment is negative for acute distress and she exhibits normal effort. Right breast exam is performed and reveals mild induration deep to her axillary site and her lumpectomy site is along the inframammary fold, both appear healed and there is some mild edema of the chest wall laterally below the axilla. No erythema is noted of either of these sites. Abdomen has active bowel sounds in all quadrants and is intact. The abdomen is soft, non tender, non distended. Lower extremities are negative for pretibial pitting edema, deep calf tenderness, cyanosis or clubbing.   ECOG = 1  0 - Asymptomatic (Fully active, able to carry on all predisease activities without restriction)  1 - Symptomatic but completely ambulatory (Restricted in physically strenuous activity but ambulatory and able to carry out work of a light or sedentary nature. For example, light housework, office work)  2 - Symptomatic, <50% in bed during the day (Ambulatory and capable of all self care but unable to carry out any work activities. Up and about more than 50% of waking hours)  3 - Symptomatic, >50% in bed, but not bedbound (Capable of only limited self-care, confined to bed or chair 50% or more of waking hours)  4 - Bedbound (Completely disabled. Cannot carry on any self-care. Totally confined to bed or chair)  5 - Death   Eustace Pen MM, Creech RH, Tormey DC, et al. [phone]). "Toxicity  and response criteria of the [facility]". [place] Oncol. 5 (6): 649-55    LABORATORY DATA:  Lab Results  Component Value Date   WBC 7.7 01/21/2018   HGB 14.8 01/21/2018   HCT 43.8 01/21/2018   MCV 93.2 01/21/2018   PLT 240 01/21/2018   Lab Results  Component Value Date   NA 138 01/21/2018   K 3.9 01/21/2018   CL 102 01/21/2018   CO2 25 01/21/2018   Lab Results  Component Value Date   ALT 18 01/21/2018   AST 28 01/21/2018   ALKPHOS 82 01/21/2018   BILITOT 0.6 01/21/2018      RADIOGRAPHY: Chest 2 View  Result Date: 01/21/2018 CLINICAL DATA:  Preoperative evaluation for lumpectomy. Hypertension. EXAM: CHEST - 2 VIEW COMPARISON:  Chest radiograph and chest CT August 07, 2008 FINDINGS: There are scattered areas of scarring bilaterally. There is no edema or consolidation. The heart size and pulmonary vascularity are normal. No adenopathy. There is degenerative change in the thoracic spine. IMPRESSION: No edema or consolidation. Scattered areas of scarring bilaterally. No adenopathy. Electronically Signed   By: Lowella Grip III M.D.   On: 01/21/2018 13:29   Nm Sentinel Node Inj-no Rpt (breast)  Result Date: 01/31/2018 Sulfur colloid was injected by the nuclear medicine technologist for melanoma sentinel node.   Mm Breast Surgical Specimen  Result Date: 01/31/2018 CLINICAL DATA:  Patient is post radioactive seed localization and subsequent surgical excision of biopsy-proven malignancy right breast. EXAM: SPECIMEN RADIOGRAPH OF THE RIGHT BREAST COMPARISON:  Previous exam(s). FINDINGS: Status post excision of the right breast. The radioactive seed and biopsy marker clip are present, completely intact, and were marked for pathology. Results were called to the OR at the time of dictation. IMPRESSION: Specimen radiograph of the right breast. Electronically Signed   By: Marin Olp M.D.   On: 01/31/2018 09:56   Mm Rt Radioactive Seed Loc Mammo  Guide  Result Date: 01/28/2018 CLINICAL DATA:  Biopsy-proven grade 1-2 invasive ductal carcinoma and DCIS involving the LOWER  INNER QUADRANT of the RIGHT breast at POSTERIOR depth. Radioactive seed localization prior to lumpectomy which is scheduled for Monday, April 22. EXAM: MAMMOGRAPHIC GUIDED RADIOACTIVE SEED LOCALIZATION OF THE RIGHT BREAST COMPARISON:  Previous exam(s). FINDINGS: Patient presents for radioactive seed localization prior to RIGHT breast lumpectomy. I met with the patient and we discussed the procedure of seed localization including benefits and alternatives. We discussed the high likelihood of a successful procedure. We discussed the risks of the procedure including infection, bleeding, tissue injury and further surgery. We discussed the low dose of radioactivity involved in the procedure. Informed, written consent was given. The usual time-out protocol was performed immediately prior to the procedure. Using mammographic guidance, sterile technique with chlorhexidine as skin antisepsis, 1% lidocaine as local anesthetic, an I-125 radioactive seed was used to localize the coil shaped tissue marker clip associated with the mass in the LOWER INNER QUADRANT of the RIGHT breast at POSTERIOR depth using a MEDIAL approach. The follow-up mammogram images confirm the seed in the expected location immediately adjacent to the heart shaped tissue marker clip and the biopsied mass. Images were marked for Dr. Marlou Starks. Follow-up survey of the patient confirms the presence of the radioactive seed. Order number of I-125 seed: [id number] Total activity: 0.253 mCi Reference Date: 01/28/2018 The patient tolerated the procedure well and was released from the [facility]. She was given instructions regarding seed removal. IMPRESSION: Radioactive seed localization of the RIGHT breast. No apparent complications. Electronically Signed   By: Evangeline Dakin M.D.   On: 01/28/2018 14:55       IMPRESSION/PLAN: 1. Stage  IA, [id number], grade 2, ER/PR positive invasive ductal carcinoma with DCIS of the right breast. Dr. Lisbeth Renshaw discusses the pathology findings and reviews the nature of invasive breast disease. She has completed her surgical resection and is healing well. She is counseled on the rationale for adjuvant external radiotherapy to the breast followed by antiestrogen therapy. We discussed the risks, benefits, short, and long term effects of radiotherapy, and the patient is interested in proceeding. Dr. Lisbeth Renshaw discusses the delivery and logistics of radiotherapy and anticipates a course of 4 weeks of radiotherapy. Written consent is obtained and placed in the chart, a copy was provided to the patient. She will return in about two weeks for simulation.  2. Right chest wall edema. There appears to be some early features of lymphedema and she is being referred for eval and treatment of this with PT.  In a visit lasting 60 minutes, greater than 50% of the time was spent face to face discussing her case, and coordinating the patient's care.  The above documentation reflects my direct findings during this shared patient visit. Please see the separate note by Dr. Lisbeth Renshaw on this date for the remainder of the patient's plan of care.    Carola Rhine, PAC

## 2018-02-15 NOTE — Telephone Encounter (Signed)
CALLED PATIENT TO INFORM OF PT APPT. FOR 02-23-18- ARRIVAL TIME - 9:15 AM @ [facility], LVM FOR A RETURN CALL

## 2018-02-16 ENCOUNTER — Telehealth: Payer: Self-pay | Admitting: Radiation Oncology

## 2018-02-16 ENCOUNTER — Encounter: Payer: Self-pay | Admitting: General Practice

## 2018-02-16 NOTE — Telephone Encounter (Signed)
Confirmed PT appt., pt states that was changed to 5/16.

## 2018-02-16 NOTE — Progress Notes (Unsigned)
[place] Psychosocial Distress Screening Clinical Social Work  Clinical Social Work was referred by distress screening protocol.  The patient scored a 8 on the Psychosocial Distress Thermometer which indicates moderate distress. Clinical Social Worker Edwyna Shell to assess for distress and other psychosocial needs. CSW and patient discussed common feeling and emotions when being diagnosed with cancer, and the importance of support during treatment. CSW informed patient of the support team and support services at [facility]. CSW provided contact information and encouraged patient to call with any questions or concerns.  No needs/concerns at this time, patient will recontact CSW as needed.  Pain is major issue, anticipates help after connecting w lymphedema clinic next week.  ONCBCN DISTRESS SCREENING 02/15/2018  Screening Type Initial Screening  Distress experienced in past week (1-10) 8  Physical Problem type Pain    Clinical Social Worker follow up needed: No.  If yes, follow up plan:   Edwyna Shell, LCSW Clinical Social Worker Phone:  [phone]

## 2018-02-23 ENCOUNTER — Ambulatory Visit: Payer: Medicare Other | Admitting: Physical Therapy

## 2018-02-23 ENCOUNTER — Ambulatory Visit
Admission: RE | Admit: 2018-02-23 | Discharge: 2018-02-23 | Disposition: A | Discharge status: Home / Self Care | Payer: Medicare Other | Source: Ambulatory Visit | Attending: Radiation Oncology | Admitting: Radiation Oncology

## 2018-02-23 DIAGNOSIS — C50311 Malignant neoplasm of lower-inner quadrant of right female breast: Secondary | ICD-10-CM | POA: Diagnosis not present

## 2018-02-23 DIAGNOSIS — Z170 Estrogen receptor positive status [ER+]: Secondary | ICD-10-CM | POA: Diagnosis not present

## 2018-02-23 DIAGNOSIS — Z510 Encounter for antineoplastic radiation therapy: Secondary | ICD-10-CM | POA: Diagnosis not present

## 2018-02-23 NOTE — Progress Notes (Signed)
  Radiation Oncology         [phone] ________________________________  Name: SARAHANNE NOVAKOWSKI MRN: [id number]  Date: 02/23/2018  DOB: 10-09-1946   DIAGNOSIS:     ICD-10-CM   1. Malignant neoplasm of lower-inner quadrant of right breast of female, estrogen receptor positive ([place]) C50.311    Z17.0     SIMULATION AND TREATMENT PLANNING NOTE  The patient presented for simulation prior to beginning her course of radiation treatment for her diagnosis of right-sided breast cancer. The patient was placed in a supine position on a breast board. A customized vac-lock bag was constructed and this complex treatment device will be used on a daily basis during her treatment. In this fashion, a CT scan was obtained through the chest area and an isocenter was placed near the chest wall within the breast.  The patient will be planned to receive a course of radiation initially to a dose of 42.56 Gy. This will consist of a whole breast radiotherapy technique. To accomplish this, 2 customized blocks have been designed which will correspond to medial and lateral whole breast tangent fields. This treatment will be accomplished at 2.67 Gy per fraction. A forward planning technique will also be evaluated to determine if this approach improves the plan. It is anticipated that the patient will then receive a 8 Gy boost to the seroma cavity which has been contoured. This will be accomplished at 2 Gy per fraction.   This initial treatment will consist of a 3-D conformal technique. The seroma has been contoured as the primary target structure. Additionally, dose volume histograms of both this target as well as the lungs and heart will also be evaluated. Such an approach is necessary to ensure that the target area is adequately covered while the nearby critical  normal structures are adequately spared.  Plan:  The final anticipated total dose therefore will correspond to 52.56  Gy.    _______________________________   Jodelle Gross, MD, PhD

## 2018-02-23 NOTE — Progress Notes (Signed)
  Radiation Oncology         [phone] ________________________________  Name: STARR URIAS MRN: [id number]  Date: 02/23/2018  DOB: 1946/03/19  Optical Surface Tracking Plan:  Since intensity modulated radiotherapy (IMRT) and 3D conformal radiation treatment methods are predicated on accurate and precise positioning for treatment, intrafraction motion monitoring is medically necessary to ensure accurate and safe treatment delivery.  The ability to quantify intrafraction motion without excessive ionizing radiation dose can only be performed with optical surface tracking. Accordingly, surface imaging offers the opportunity to obtain 3D measurements of patient position throughout IMRT and 3D treatments without excessive radiation exposure.  I am ordering optical surface tracking for this patient's upcoming course of radiotherapy. ________________________________  Kyung Rudd, MD 02/23/2018 5:14 PM    Reference:   Ursula Alert, J, et al. Surface imaging-based analysis of intrafraction motion for breast radiotherapy patients.Journal of [place], n. 6, nov. 2014. ISSN [id number].   Available at: <http://www.jacmp.org/index.php/jacmp/article/view/4957>.

## 2018-02-24 ENCOUNTER — Telehealth: Payer: Self-pay | Admitting: Hematology and Oncology

## 2018-02-24 ENCOUNTER — Encounter: Payer: Self-pay | Admitting: Physical Therapy

## 2018-02-24 ENCOUNTER — Ambulatory Visit: Payer: Medicare Other | Attending: Radiation Oncology | Admitting: Physical Therapy

## 2018-02-24 DIAGNOSIS — Z483 Aftercare following surgery for neoplasm: Secondary | ICD-10-CM | POA: Insufficient documentation

## 2018-02-24 DIAGNOSIS — M6281 Muscle weakness (generalized): Secondary | ICD-10-CM | POA: Diagnosis not present

## 2018-02-24 NOTE — Therapy (Signed)
[place], [place], [address] Phone: [phone]   Fax:  [phone]  Physical Therapy Evaluation  Patient Details  Name: Cheryl Aguirre MRN: [id number] Date of Birth: 27-Jan-1946 Referring Provider: Shona Simpson    Encounter Date: 02/24/2018  PT End of Session - 02/24/18 1736    Visit Number  1    Number of Visits  5    Date for PT Re-Evaluation  04/26/18  pt will not start treatment til after radiation     PT Start Time  1430    PT Stop Time  1515    PT Time Calculation (min)  45 min    Activity Tolerance  Patient tolerated treatment well    Behavior During Therapy  [facility] for tasks assessed/performed       Past Medical History:  Diagnosis Date  . Anxiety   . Arthritis    left hip  . Chronic headaches   . Colon polyps   . Depression   . GERD (gastroesophageal reflux disease)   . Hypertension    elevated at times.  no meds  . IBS (irritable bowel syndrome)   . Insomnia   . Osteopenia   . Tobacco abuse     Past Surgical History:  Procedure Laterality Date  . ABDOMINAL HYSTERECTOMY     bleeding, fibroid, endometriosis (total)  . BREAST LUMPECTOMY WITH RADIOACTIVE SEED AND SENTINEL LYMPH NODE BIOPSY Right 01/31/2018   Procedure: BREAST LUMPECTOMY WITH RADIOACTIVE SEED AND SENTINEL LYMPH NODE BIOPSY;  Surgeon: Jovita Kussmaul, MD;  Location: [facility];  Service: General;  Laterality: Right;  . CATARACT EXTRACTION W/PHACO Left 02/11/2017   Procedure: CATARACT EXTRACTION PHACO AND INTRAOCULAR LENS PLACEMENT (IOC);  Surgeon: Leandrew Koyanagi, MD;  Location: [facility];  Service: Ophthalmology;  Laterality: Left;  [place] 1:02.8 AP% 13.7 CDE 8.58 FLUID PACK LOT # [id number] H  . CATARACT EXTRACTION W/PHACO Right 03/10/2017   Procedure: CATARACT EXTRACTION PHACO AND INTRAOCULAR LENS PLACEMENT ([place]) Right symfony toric lens;  Surgeon: Leandrew Koyanagi, MD;  Location: [facility];  Service:  Ophthalmology;  Laterality: Right;  symfony toric lens prefers early  . CT of chest  10/09   bronchiectasis  . ENDOMETRIAL BIOPSY    . LAPAROSCOPY  1984    There were no vitals filed for this visit.   Subjective Assessment - 02/24/18 1456    Subjective  pt had a hard time with swelling in her axilla immediately after surgery     Pertinent History  right breast cancer( ER/PR postitive)  with lumpectomy and 2 sentinel lymph nodes removed on 01/31/2018 . Pt will not need chemotherapy but will have radiation starting May 22.  She reports a history of osteopenia so she says she will be on tamoxofen     Currently in Pain?  Yes    Pain Score  2     Pain Location  Axilla    Pain Orientation  Right    Pain Descriptors / Indicators  Aching;Dull    Pain Type  Acute pain    Pain Radiating Towards  down her arm     Pain Onset  1 to 4 weeks ago    Aggravating Factors   lying on it, , pressure from a bra makes it worse          [facility] PT Assessment - 02/24/18 0001      Assessment   Medical Diagnosis  right breast cancer     Referring Provider  Shona Simpson     Onset Date/Surgical Date  01/31/18    Hand Dominance  Right      Precautions   Precautions  Other (comment)      Restrictions   Weight Bearing Restrictions  No      Balance Screen   Has the patient fallen in the past 6 months  No    Has the patient had a decrease in activity level because of a fear of falling?   No    Is the patient reluctant to leave their home because of a fear of falling?   No      Home Film/video editor residence    Living Arrangements  Spouse/significant other    Available Help at Discharge  Available PRN/intermittently      Prior Function   Level of [place]  Retired    Leisure  wants to start walking and strengtheing       Cognition   Overall Cognitive Status  Within Functional Limits for tasks assessed      Observation/Other Assessments    Observations  pt with fullness at axillary incision     Other Surveys   -- lymphedema life impact scale 22.06 % impaired       Posture/Postural Control   Posture/Postural Control  Postural limitations    Postural Limitations  Rounded Shoulders;Increased thoracic kyphosis      ROM / Strength   AROM / PROM / Strength  AROM      AROM   Right Shoulder Flexion  150 Degrees    Right Shoulder ABduction  170 Degrees    Left Shoulder Flexion  150 Degrees    Left Shoulder ABduction  170 Degrees      Strength   Overall Strength Comments  Pt is thin with generalized decreased muscle mass. She is able to move her arms freely overhead without signs of discomfort       Palpation   Palpation comment  pt has about a 4cm x 4cm firm area at right axilla just proximal to lymph node dissection incision that is best appreciated with her arm almost down at her side. There is possibly axillary cording into upper arm, but not down to elbow , Pt does not have a "pulling" sensation         LYMPHEDEMA/ONCOLOGY QUESTIONNAIRE - 02/24/18 1458      Type   Cancer Type  breast cancer       Right Upper Extremity Lymphedema   10 cm Proximal to Olecranon Process  24.5 cm    Olecranon Process  24 cm    15 cm Proximal to Ulnar Styloid Process  23 cm    Just Proximal to Ulnar Styloid Process  15.1 cm    Across Hand at [employer]  19 cm    At [place] of 2nd Digit  6 cm      Left Upper Extremity Lymphedema   10 cm Proximal to Olecranon Process  25 cm    Olecranon Process  24 cm    15 cm Proximal to Ulnar Styloid Process  23 cm    Just Proximal to Ulnar Styloid Process  15.3 cm    Across Hand at [employer]  18.7 cm    At [place] of 2nd Digit  6 cm             Outpatient Rehab from 02/24/2018 in [place]  Lymphedema Life Impact Scale Total Score  22.06 %      Objective measurements completed on examination: See above findings.      [facility]  Treatment/Exercise - 02/24/18 0001      Self-Care   Self-Care  Other Self-Care Comments    Other Self-Care Comments   gave flyer about ABC class for more information about lymphedema       Exercises   Exercises  Shoulder;Other Exercises    Other Exercises   encouraged pt to walk throughout radiation to combat fatigue       Shoulder Exercises: Supine   Other Supine Exercises  supine dowel rod flexion, abduction , external rotation and "chicken wings"              PT Education - 02/24/18 1736    Education provided  Yes    Education Details  ABC class and supine dowel rod exercise     Person(s) Educated  Patient    Methods  Explanation;Demonstration;Handout    Comprehension  Verbalized understanding;Returned demonstration          PT Long Term Goals - 02/24/18 1742      PT LONG TERM GOAL #1   Title  Pt will report the pain and fullness in her right axilla is decreased by 75%    Time  8    Period  Weeks    Status  New      PT LONG TERM GOAL #2   Title  Pt will be indpenedent in a home program for strengthening    Time  8    Period  Weeks    Status  New             Plan - 02/24/18 1737    Clinical Impression Statement  Mrs Hams presents with painful fullness near site of axillary sentinel node removal that she says is much improved since surgery.  We talked about manual lymph drainage, stretching and manual therapy treatment options, but she hopes it will continue to improve on its own.  She wants to focus on radiation and come for PT when that is over to learn strengthening exercises. Pt will call back when she is ready to start PT treatment     History and Personal Factors relevant to plan of care:  recent surgery history of osteopenia     Clinical Presentation  Evolving    Clinical Presentation due to:  to have radiation     Clinical Decision Making  Moderate    Rehab Potential  Good    Clinical Impairments Affecting Rehab Potential  as above     PT  Frequency  2x / week    PT Duration  3 weeks    PT Treatment/Interventions  ADLs/Self Care Home Management;Therapeutic exercise;Therapeutic activities;Patient/family education;Compression bandaging;Manual lymph drainage;Manual techniques;Scar mobilization    PT Next Visit Plan  Reevaluate axillary fullness and treat as indicated. instruct in Strength ABC program     Consulted and Agree with Plan of Care  Patient       Patient will benefit from skilled therapeutic intervention in order to improve the following deficits and impairments:  Pain, Increased fascial restricitons, Postural dysfunction, Decreased strength  Visit Diagnosis: Aftercare following surgery for neoplasm - Plan: PT plan of care cert/re-cert  Muscle weakness (generalized) - Plan: PT plan of care cert/re-cert     Problem List Patient Active Problem List   Diagnosis Date Noted  . Malignant neoplasm of lower-inner quadrant of  right breast of female, estrogen receptor positive ([place]) 02/08/2018  . Lump of breast, right 11/30/2017  . Estrogen deficiency 11/23/2016  . Hyperglycemia 11/14/2016  . Screening for thyroid disorder 11/14/2016  . Family history of colon cancer 11/22/2015  . Colon cancer screening 11/22/2015  . Encounter for Medicare annual wellness exam 10/31/2013  . Headache(784.0) 11/01/2012  . Gynecologic exam normal 09/15/2011  . Routine general medical examination at a health care facility 09/07/2011  . ONYCHOMYCOSIS 07/24/2010  . PURE HYPERCHOLESTEROLEMIA 07/21/2010  . COLONIC POLYPS, HYPERPLASTIC, HX OF 02/18/2010  . Nonspecific (abnormal) findings on radiological and other examination of body structure 08/02/2008  . [place] LUNG FIELD 08/02/2008  . Osteopenia 07/20/2007  . ANXIETY 02/11/2007  . TOBACCO ABUSE 02/11/2007  . Depression with anxiety 02/11/2007  . GERD 02/11/2007  . INSOMNIA 02/11/2007  . Fibrocystic breast changes 02/11/2007   Donato Heinz. Owens Shark  PT  [place] 02/24/2018, [place], [place], [address] Phone: [phone]   Fax:  [phone]  Name: Cheryl Aguirre MRN: [id number] Date of Birth: [birth year]

## 2018-02-24 NOTE — Telephone Encounter (Signed)
Per 5/16 sch msg.  No appt w/ provider folloiwng XRT.Marland KitchenMarland KitchenScheduled before XRT.

## 2018-02-24 NOTE — Patient Instructions (Signed)

## 2018-02-25 DIAGNOSIS — Z510 Encounter for antineoplastic radiation therapy: Secondary | ICD-10-CM | POA: Diagnosis not present

## 2018-02-25 DIAGNOSIS — C50311 Malignant neoplasm of lower-inner quadrant of right female breast: Secondary | ICD-10-CM | POA: Diagnosis not present

## 2018-02-25 DIAGNOSIS — Z170 Estrogen receptor positive status [ER+]: Secondary | ICD-10-CM | POA: Diagnosis not present

## 2018-03-02 ENCOUNTER — Ambulatory Visit: Payer: Medicare Other

## 2018-03-03 ENCOUNTER — Ambulatory Visit
Admission: RE | Admit: 2018-03-03 | Discharge: 2018-03-03 | Disposition: A | Discharge status: Home / Self Care | Payer: Medicare Other | Source: Ambulatory Visit | Attending: Radiation Oncology | Admitting: Radiation Oncology

## 2018-03-03 DIAGNOSIS — C50311 Malignant neoplasm of lower-inner quadrant of right female breast: Secondary | ICD-10-CM

## 2018-03-03 DIAGNOSIS — Z510 Encounter for antineoplastic radiation therapy: Secondary | ICD-10-CM | POA: Diagnosis not present

## 2018-03-03 DIAGNOSIS — Z170 Estrogen receptor positive status [ER+]: Secondary | ICD-10-CM | POA: Diagnosis not present

## 2018-03-03 MED ORDER — RADIAPLEXRX EX GEL
Freq: Once | CUTANEOUS | Status: AC
  Administered 2018-03-03: 17:00:00 via TOPICAL

## 2018-03-03 NOTE — Progress Notes (Signed)
Pt here for patient teaching.  Pt given Radiation and You booklet, skin care instructions and Radiaplex gel.  Reviewed areas of pertinence such as fatigue, hair loss, skin changes, breast tenderness and breast swelling . Pt able to give teach back of to pat skin and use unscented/gentle soap,apply Radiaplex bid, avoid applying anything to skin within 4 hours of treatment, avoid wearing an under wire bra and to use an electric razor if they must shave. Pt verbalizes understanding of information given and will contact nursing with any questions or concerns.    Cori Razor, RN

## 2018-03-04 ENCOUNTER — Ambulatory Visit
Admission: RE | Admit: 2018-03-04 | Discharge: 2018-03-04 | Disposition: A | Discharge status: Home / Self Care | Payer: Medicare Other | Source: Ambulatory Visit | Attending: Radiation Oncology | Admitting: Radiation Oncology

## 2018-03-04 DIAGNOSIS — C50311 Malignant neoplasm of lower-inner quadrant of right female breast: Secondary | ICD-10-CM | POA: Diagnosis not present

## 2018-03-04 DIAGNOSIS — Z510 Encounter for antineoplastic radiation therapy: Secondary | ICD-10-CM | POA: Diagnosis not present

## 2018-03-04 DIAGNOSIS — Z170 Estrogen receptor positive status [ER+]: Secondary | ICD-10-CM | POA: Diagnosis not present

## 2018-03-08 ENCOUNTER — Ambulatory Visit
Admission: RE | Admit: 2018-03-08 | Discharge: 2018-03-08 | Disposition: A | Discharge status: Home / Self Care | Payer: Medicare Other | Source: Ambulatory Visit | Attending: Radiation Oncology | Admitting: Radiation Oncology

## 2018-03-08 DIAGNOSIS — C50311 Malignant neoplasm of lower-inner quadrant of right female breast: Secondary | ICD-10-CM | POA: Diagnosis not present

## 2018-03-08 DIAGNOSIS — Z510 Encounter for antineoplastic radiation therapy: Secondary | ICD-10-CM | POA: Diagnosis not present

## 2018-03-08 DIAGNOSIS — Z170 Estrogen receptor positive status [ER+]: Secondary | ICD-10-CM | POA: Diagnosis not present

## 2018-03-09 ENCOUNTER — Ambulatory Visit
Admission: RE | Admit: 2018-03-09 | Discharge: 2018-03-09 | Disposition: A | Discharge status: Home / Self Care | Payer: Medicare Other | Source: Ambulatory Visit | Attending: Radiation Oncology | Admitting: Radiation Oncology

## 2018-03-09 DIAGNOSIS — C50311 Malignant neoplasm of lower-inner quadrant of right female breast: Secondary | ICD-10-CM | POA: Diagnosis not present

## 2018-03-09 DIAGNOSIS — Z510 Encounter for antineoplastic radiation therapy: Secondary | ICD-10-CM | POA: Diagnosis not present

## 2018-03-09 DIAGNOSIS — Z170 Estrogen receptor positive status [ER+]: Secondary | ICD-10-CM | POA: Diagnosis not present

## 2018-03-10 ENCOUNTER — Ambulatory Visit
Admission: RE | Admit: 2018-03-10 | Discharge: 2018-03-10 | Disposition: A | Discharge status: Home / Self Care | Payer: Medicare Other | Source: Ambulatory Visit | Attending: Radiation Oncology | Admitting: Radiation Oncology

## 2018-03-10 DIAGNOSIS — C50311 Malignant neoplasm of lower-inner quadrant of right female breast: Secondary | ICD-10-CM | POA: Diagnosis not present

## 2018-03-10 DIAGNOSIS — Z510 Encounter for antineoplastic radiation therapy: Secondary | ICD-10-CM | POA: Diagnosis not present

## 2018-03-10 DIAGNOSIS — Z170 Estrogen receptor positive status [ER+]: Secondary | ICD-10-CM | POA: Diagnosis not present

## 2018-03-11 ENCOUNTER — Ambulatory Visit
Admission: RE | Admit: 2018-03-11 | Discharge: 2018-03-11 | Disposition: A | Discharge status: Home / Self Care | Payer: Medicare Other | Source: Ambulatory Visit | Attending: Radiation Oncology | Admitting: Radiation Oncology

## 2018-03-11 DIAGNOSIS — Z510 Encounter for antineoplastic radiation therapy: Secondary | ICD-10-CM | POA: Diagnosis not present

## 2018-03-11 DIAGNOSIS — C50311 Malignant neoplasm of lower-inner quadrant of right female breast: Secondary | ICD-10-CM

## 2018-03-11 DIAGNOSIS — Z170 Estrogen receptor positive status [ER+]: Principal | ICD-10-CM

## 2018-03-11 MED ORDER — ALRA NON-METALLIC DEODORANT (RAD-ONC)
1.0000 "application " | Freq: Once | TOPICAL | Status: AC
  Administered 2018-03-11: 1 via TOPICAL

## 2018-03-14 ENCOUNTER — Ambulatory Visit
Admission: RE | Admit: 2018-03-14 | Discharge: 2018-03-14 | Disposition: A | Discharge status: Home / Self Care | Payer: Medicare Other | Source: Ambulatory Visit | Attending: Radiation Oncology | Admitting: Radiation Oncology

## 2018-03-14 DIAGNOSIS — Z510 Encounter for antineoplastic radiation therapy: Secondary | ICD-10-CM | POA: Insufficient documentation

## 2018-03-14 DIAGNOSIS — C50311 Malignant neoplasm of lower-inner quadrant of right female breast: Secondary | ICD-10-CM | POA: Diagnosis not present

## 2018-03-14 DIAGNOSIS — Z170 Estrogen receptor positive status [ER+]: Secondary | ICD-10-CM | POA: Diagnosis not present

## 2018-03-15 ENCOUNTER — Ambulatory Visit
Admission: RE | Admit: 2018-03-15 | Discharge: 2018-03-15 | Disposition: A | Discharge status: Home / Self Care | Payer: Medicare Other | Source: Ambulatory Visit | Attending: Radiation Oncology | Admitting: Radiation Oncology

## 2018-03-15 DIAGNOSIS — Z170 Estrogen receptor positive status [ER+]: Secondary | ICD-10-CM | POA: Diagnosis not present

## 2018-03-15 DIAGNOSIS — C50311 Malignant neoplasm of lower-inner quadrant of right female breast: Secondary | ICD-10-CM | POA: Diagnosis not present

## 2018-03-15 DIAGNOSIS — Z510 Encounter for antineoplastic radiation therapy: Secondary | ICD-10-CM | POA: Diagnosis not present

## 2018-03-16 ENCOUNTER — Ambulatory Visit
Admission: RE | Admit: 2018-03-16 | Discharge: 2018-03-16 | Disposition: A | Discharge status: Home / Self Care | Payer: Medicare Other | Source: Ambulatory Visit | Attending: Radiation Oncology | Admitting: Radiation Oncology

## 2018-03-16 DIAGNOSIS — C50311 Malignant neoplasm of lower-inner quadrant of right female breast: Secondary | ICD-10-CM | POA: Diagnosis not present

## 2018-03-16 DIAGNOSIS — Z510 Encounter for antineoplastic radiation therapy: Secondary | ICD-10-CM | POA: Diagnosis not present

## 2018-03-16 DIAGNOSIS — Z170 Estrogen receptor positive status [ER+]: Secondary | ICD-10-CM | POA: Diagnosis not present

## 2018-03-17 ENCOUNTER — Ambulatory Visit
Admission: RE | Admit: 2018-03-17 | Discharge: 2018-03-17 | Disposition: A | Discharge status: Home / Self Care | Payer: Medicare Other | Source: Ambulatory Visit | Attending: Radiation Oncology | Admitting: Radiation Oncology

## 2018-03-17 DIAGNOSIS — Z170 Estrogen receptor positive status [ER+]: Secondary | ICD-10-CM | POA: Diagnosis not present

## 2018-03-17 DIAGNOSIS — Z510 Encounter for antineoplastic radiation therapy: Secondary | ICD-10-CM | POA: Diagnosis not present

## 2018-03-17 DIAGNOSIS — C50311 Malignant neoplasm of lower-inner quadrant of right female breast: Secondary | ICD-10-CM | POA: Diagnosis not present

## 2018-03-18 ENCOUNTER — Ambulatory Visit
Admission: RE | Admit: 2018-03-18 | Discharge: 2018-03-18 | Disposition: A | Discharge status: Home / Self Care | Payer: Medicare Other | Source: Ambulatory Visit | Attending: Radiation Oncology | Admitting: Radiation Oncology

## 2018-03-18 DIAGNOSIS — C50311 Malignant neoplasm of lower-inner quadrant of right female breast: Secondary | ICD-10-CM | POA: Diagnosis not present

## 2018-03-18 DIAGNOSIS — Z170 Estrogen receptor positive status [ER+]: Secondary | ICD-10-CM | POA: Diagnosis not present

## 2018-03-18 DIAGNOSIS — Z510 Encounter for antineoplastic radiation therapy: Secondary | ICD-10-CM | POA: Diagnosis not present

## 2018-03-21 ENCOUNTER — Ambulatory Visit
Admission: RE | Admit: 2018-03-21 | Discharge: 2018-03-21 | Disposition: A | Discharge status: Home / Self Care | Payer: Medicare Other | Source: Ambulatory Visit | Attending: Radiation Oncology | Admitting: Radiation Oncology

## 2018-03-21 DIAGNOSIS — Z170 Estrogen receptor positive status [ER+]: Secondary | ICD-10-CM | POA: Diagnosis not present

## 2018-03-21 DIAGNOSIS — Z510 Encounter for antineoplastic radiation therapy: Secondary | ICD-10-CM | POA: Diagnosis not present

## 2018-03-21 DIAGNOSIS — C50311 Malignant neoplasm of lower-inner quadrant of right female breast: Secondary | ICD-10-CM | POA: Diagnosis not present

## 2018-03-22 ENCOUNTER — Ambulatory Visit
Admission: RE | Admit: 2018-03-22 | Discharge: 2018-03-22 | Disposition: A | Discharge status: Home / Self Care | Payer: Medicare Other | Source: Ambulatory Visit | Attending: Radiation Oncology | Admitting: Radiation Oncology

## 2018-03-22 DIAGNOSIS — Z170 Estrogen receptor positive status [ER+]: Secondary | ICD-10-CM | POA: Diagnosis not present

## 2018-03-22 DIAGNOSIS — C50311 Malignant neoplasm of lower-inner quadrant of right female breast: Secondary | ICD-10-CM | POA: Diagnosis not present

## 2018-03-22 DIAGNOSIS — Z510 Encounter for antineoplastic radiation therapy: Secondary | ICD-10-CM | POA: Diagnosis not present

## 2018-03-23 ENCOUNTER — Inpatient Hospital Stay: Payer: Medicare Other | Attending: Hematology and Oncology | Admitting: Genetic Counselor

## 2018-03-23 ENCOUNTER — Ambulatory Visit
Admission: RE | Admit: 2018-03-23 | Discharge: 2018-03-23 | Disposition: A | Discharge status: Home / Self Care | Payer: Medicare Other | Source: Ambulatory Visit | Attending: Radiation Oncology | Admitting: Radiation Oncology

## 2018-03-23 ENCOUNTER — Inpatient Hospital Stay: Payer: Medicare Other

## 2018-03-23 ENCOUNTER — Encounter: Payer: Self-pay | Admitting: Genetic Counselor

## 2018-03-23 DIAGNOSIS — C50311 Malignant neoplasm of lower-inner quadrant of right female breast: Secondary | ICD-10-CM | POA: Diagnosis not present

## 2018-03-23 DIAGNOSIS — Z808 Family history of malignant neoplasm of other organs or systems: Secondary | ICD-10-CM | POA: Diagnosis not present

## 2018-03-23 DIAGNOSIS — Z803 Family history of malignant neoplasm of breast: Secondary | ICD-10-CM | POA: Diagnosis not present

## 2018-03-23 DIAGNOSIS — Z7183 Encounter for nonprocreative genetic counseling: Secondary | ICD-10-CM | POA: Diagnosis not present

## 2018-03-23 DIAGNOSIS — Z86010 Personal history of colonic polyps: Secondary | ICD-10-CM

## 2018-03-23 DIAGNOSIS — Z170 Estrogen receptor positive status [ER+]: Secondary | ICD-10-CM | POA: Diagnosis not present

## 2018-03-23 DIAGNOSIS — Z800 Family history of malignant neoplasm of digestive organs: Secondary | ICD-10-CM

## 2018-03-23 DIAGNOSIS — Z510 Encounter for antineoplastic radiation therapy: Secondary | ICD-10-CM | POA: Diagnosis not present

## 2018-03-23 DIAGNOSIS — M810 Age-related osteoporosis without current pathological fracture: Secondary | ICD-10-CM | POA: Insufficient documentation

## 2018-03-23 NOTE — Progress Notes (Signed)
REFERRING PROVIDER: Nicholas Lose, MD [place], [place] [id number]  PRIMARY PROVIDER:  Abner Greenspan, MD  PRIMARY REASON FOR VISIT:  1. COLONIC POLYPS, HYPERPLASTIC, HX OF   2. Family history of breast cancer   3. Family history of thyroid cancer   4. Family history of pancreatic cancer   5. Family history of colon cancer   6. Malignant neoplasm of lower-inner quadrant of right breast of female, estrogen receptor positive ([place])      HISTORY OF PRESENT ILLNESS:   Cheryl Aguirre, a 72 y.o. female, was seen for a [facility] cancer genetics consultation at the request of Dr. Lindi Adie due to a personal and family history of cancer.  Cheryl Aguirre presents to clinic today to discuss the possibility of a hereditary predisposition to cancer, genetic testing, and to further clarify her future cancer risks, as well as potential cancer risks for family members.   In 2019, at the age of 67, Cheryl Aguirre was diagnosed with invasive ductal carcinoma of the right breast.  The tumor is ER+/PR+/Her2-. This was treated with lumpectomy, radiation and tamoxifen. She has a colonoscopy every 5 years due to her father's diagnosis of colon cancer.   CANCER HISTORY:    Malignant neoplasm of lower-inner quadrant of right breast of female, estrogen receptor positive ([place])   01/31/2018 Initial Diagnosis    Right lumpectomy: IDC with DCIS 0.4 cm, margins negative, 0/2 lymph nodes negative, grade 2, ER 100%, PR 100%, HER-2 negative ratio 1.19, T1 a N0 stage I a      02/16/2018 Cancer Staging    Staging form: Breast, AJCC 8th Edition - Pathologic: Stage IA (pT1a, pN0, cM0, G2, ER+, PR+, HER2-) - Signed by Gardenia Phlegm, NP on 02/16/2018        HORMONAL RISK FACTORS:  Menarche was at age 89.  First live birth at age 30.  OCP use for approximately 17 years.  Ovaries intact: no.  Hysterectomy: yes.  Menopausal status: postmenopausal.  HRT use: 3-4 years. Colonoscopy: yes;  normal. Mammogram within the last year: yes. Number of breast biopsies: 1. Up to date with pelvic exams:  yes. Any excessive radiation exposure in the past:  no  Past Medical History:  Diagnosis Date  . Anxiety   . Arthritis    left hip  . Chronic headaches   . Colon polyps   . Depression   . Family history of breast cancer   . Family history of colon cancer   . Family history of pancreatic cancer   . Family history of thyroid cancer   . GERD (gastroesophageal reflux disease)   . Hypertension    elevated at times.  no meds  . IBS (irritable bowel syndrome)   . Insomnia   . Osteopenia   . Tobacco abuse     Past Surgical History:  Procedure Laterality Date  . ABDOMINAL HYSTERECTOMY     bleeding, fibroid, endometriosis (total)  . BREAST LUMPECTOMY WITH RADIOACTIVE SEED AND SENTINEL LYMPH NODE BIOPSY Right 01/31/2018   Procedure: BREAST LUMPECTOMY WITH RADIOACTIVE SEED AND SENTINEL LYMPH NODE BIOPSY;  Surgeon: Jovita Kussmaul, MD;  Location: [facility];  Service: General;  Laterality: Right;  . CATARACT EXTRACTION W/PHACO Left 02/11/2017   Procedure: CATARACT EXTRACTION PHACO AND INTRAOCULAR LENS PLACEMENT (IOC);  Surgeon: Leandrew Koyanagi, MD;  Location: [facility];  Service: Ophthalmology;  Laterality: Left;  [place] 1:02.8 AP% 13.7 CDE 8.58 FLUID PACK LOT # [id number] H  . CATARACT EXTRACTION W/PHACO Right  03/10/2017   Procedure: CATARACT EXTRACTION PHACO AND INTRAOCULAR LENS PLACEMENT ([place]) Right symfony toric lens;  Surgeon: Leandrew Koyanagi, MD;  Location: [facility];  Service: Ophthalmology;  Laterality: Right;  symfony toric lens prefers early  . CT of chest  10/09   bronchiectasis  . ENDOMETRIAL BIOPSY    . LAPAROSCOPY  1984    Social History   Socioeconomic History  . Marital status: Married    Spouse name: Not on file  . Number of children: 1  . Years of education: Not on file  . Highest education level: Not on file  Occupational History  . Occupation:  Merchandiser, retail: UNEMPLOYED  Social Needs  . Financial resource strain: Not on file  . Food insecurity:    Worry: Not on file    Inability: Not on file  . Transportation needs:    Medical: Not on file    Non-medical: Not on file  Tobacco Use  . Smoking status: Current Every Day Smoker    Packs/day: 1.00    Years: 56.00    Pack years: 56.00    Types: Cigarettes  . Smokeless tobacco: Never Used  Substance and Sexual Activity  . Alcohol use: Yes    Alcohol/week: 1.2 oz    Types: 1 Glasses of wine, 1 Cans of beer per week    Comment: occ  . Drug use: No  . Sexual activity: Yes  Lifestyle  . Physical activity:    Days per week: Not on file    Minutes per session: Not on file  . Stress: Not on file  Relationships  . Social connections:    Talks on phone: Not on file    Gets together: Not on file    Attends religious service: Not on file    Active member of club or organization: Not on file    Attends meetings of clubs or organizations: Not on file    Relationship status: Not on file  Other Topics Concern  . Not on file  Social History Narrative   Daily Caffeine Use:  2 daily     FAMILY HISTORY:  We obtained a detailed, 4-generation family history.  Significant diagnoses are listed below: Family History  Problem Relation Age of Onset  . Heart disease Other   . Cancer Mother        pancreatic, liver  . Cancer Father        colon  . Colon cancer Father 50  . Thyroid disease Sister   . Colon polyps Sister   . Irritable bowel syndrome Sister   . Breast cancer Sister 67  . Melanoma Sister 63  . Breast cancer Maternal Aunt        dx under 39  . Colon cancer Paternal Grandmother        dx in her 70s  . Thyroid cancer Paternal Grandmother        dx in her 37s  . Miscarriages / [place] Cousin   . Diabetes Cousin   . Breast cancer Cousin 17  . Thyroid disease Sister   . Skin cancer Brother   . Thyroid disease Sister   . Glaucoma Maternal Aunt     The  patient has one son who is cancer free.  She has three sisters and a brother.  One sister had melanoma at 42 and colon cancer at 72.  Her brother had skin cancer at 55.  Both parents are deceased.  There father died of colon  cancer at 78 and her mother had pancreatic cancer at 36 and died at 93.  The patient's mother was one of 10 children. One sister had breast cancer under the age of 34.  She went on to have other recurrent cancers, some in her abdomen.  Another sister had a daughter with breast cancer around age 20.  There were no other reported cancer on the maternal side.  The patient's father had colon cancer at 75.  He had two brothers who were cancer free. The grandmother had colon cancer in her 48's and died from thyroid cancer.  There are no other reported cancers on the paternal side.  Cheryl Aguirre is unaware of previous family history of genetic testing for hereditary cancer risks. Patient's maternal ancestors are of [place], [place] and [place] descent, and paternal ancestors are of [place] and [place] descent. There is no reported Ashkenazi Jewish ancestry. There is no known consanguinity.  GENETIC COUNSELING ASSESSMENT: Cheryl Aguirre is a 72 y.o. female with a personal history of breast cancer and family history of colon, breast, pancreatic cancer and melanoma which is somewhat suggestive of a hereditary cancer syndrome and predisposition to cancer. We, therefore, discussed and recommended the following at today's visit.   DISCUSSION: We discussed that about 5-10% of breast cancer is hereditary with most cases due to BRCA mutations.  There are other genes associated with hereditary breast cancer syndromes, most commonly we see ATM, CHEK2 and PALB2.  Based on her father's early onset of colon cancer, we are concerned about Lynch syndrome.  Based on this bilineal risk, genetic testing should be implemented for both breast and colon cancer syndromes, as well as melanoma syndromes based on the  melanoma and pancreatic cancer combination in the family.  We reviewed the characteristics, features and inheritance patterns of hereditary cancer syndromes. We also discussed genetic testing, including the appropriate family members to test, the process of testing, insurance coverage and turn-around-time for results. We discussed the implications of a negative, positive and/or variant of uncertain significant result. We recommended Cheryl Aguirre pursue genetic testing for the multi cancer gene panel. The Multi-Gene Panel offered by [facility] includes sequencing and/or deletion duplication testing of the following 83 genes: ALK, APC, ATM, AXIN2,BAP1,  BARD1, BLM, BMPR1A, BRCA1, BRCA2, BRIP1, CASR, CDC73, CDH1, CDK4, CDKN1B, CDKN1C, CDKN2A (p14ARF), CDKN2A (p16INK4a), CEBPA, CHEK2, CTNNA1, DICER1, DIS3L2, EGFR (c.2369C>T, p.Thr790Met variant only), EPCAM (Deletion/duplication testing only), FH, FLCN, GATA2, GPC3, GREM1 (Promoter region deletion/duplication testing only), HOXB13 (c.251G>A, p.Gly84Glu), HRAS, KIT, MAX, MEN1, MET, MITF (c.952G>A, p.Glu318Lys variant only), MLH1, MSH2, MSH3, MSH6, MUTYH, NBN, NF1, NF2, NTHL1, PALB2, PDGFRA, PHOX2B, PMS2, POLD1, POLE, POT1, PRKAR1A, PTCH1, PTEN, RAD50, RAD51C, RAD51D, RB1, RECQL4, RET, RUNX1, SDHAF2, SDHA (sequence changes only), SDHB, SDHC, SDHD, SMAD4, SMARCA4, SMARCB1, SMARCE1, STK11, SUFU, TERT, TERT, TMEM127, TP53, TSC1, TSC2, VHL, WRN and WT1.   Based on Cheryl Aguirre's personal and family history of cancer, she meets medical criteria for genetic testing. Despite that she meets criteria, she may still have an out of pocket cost. We discussed that if her out of pocket cost for testing is over $100, the laboratory will call and confirm whether she wants to proceed with testing.  If the out of pocket cost of testing is less than $100 she will be billed by the genetic testing laboratory.   PLAN: After considering the risks, benefits, and limitations, Cheryl Aguirre   provided informed consent to pursue genetic testing and the blood sample was sent to [facility]  for analysis of the Multi cancer panel. Results should be available within approximately 2-3 weeks' time, at which point they will be disclosed by telephone to Cheryl Aguirre, as will any additional recommendations warranted by these results. Cheryl Aguirre will receive a summary of her genetic counseling visit and a copy of her results once available. This information will also be available in Epic. We encouraged Cheryl Aguirre to remain in contact with cancer genetics annually so that we can continuously update the family history and inform her of any changes in cancer genetics and testing that may be of benefit for her family. Cheryl Aguirre questions were answered to her satisfaction today. Our contact information was provided should additional questions or concerns arise.  Lastly, we encouraged Cheryl Aguirre to remain in contact with cancer genetics annually so that we can continuously update the family history and inform her of any changes in cancer genetics and testing that may be of benefit for this family.   Ms.  Aguirre questions were answered to her satisfaction today. Our contact information was provided should additional questions or concerns arise. Thank you for the referral and allowing [place] to share in the care of your patient.   Helaina Stefano P. Florene Glen, [place], [facility] Certified Genetic Counselor [email or web address] phone: [phone]  The patient was seen for a total of 45 minutes in face-to-face genetic counseling.  This patient was discussed with Drs. Magrinat, Lindi Adie and/or Burr Medico who agrees with the above.    _______________________________________________________________________ For Office Staff:  Number of people involved in session: 2 Was an Intern/ student involved with case: no

## 2018-03-24 ENCOUNTER — Ambulatory Visit
Admission: RE | Admit: 2018-03-24 | Discharge: 2018-03-24 | Disposition: A | Discharge status: Home / Self Care | Payer: Medicare Other | Source: Ambulatory Visit | Attending: Radiation Oncology | Admitting: Radiation Oncology

## 2018-03-24 DIAGNOSIS — C50311 Malignant neoplasm of lower-inner quadrant of right female breast: Secondary | ICD-10-CM | POA: Diagnosis not present

## 2018-03-24 DIAGNOSIS — Z170 Estrogen receptor positive status [ER+]: Secondary | ICD-10-CM | POA: Diagnosis not present

## 2018-03-24 DIAGNOSIS — Z510 Encounter for antineoplastic radiation therapy: Secondary | ICD-10-CM | POA: Diagnosis not present

## 2018-03-25 ENCOUNTER — Ambulatory Visit
Admission: RE | Admit: 2018-03-25 | Discharge: 2018-03-25 | Disposition: A | Discharge status: Home / Self Care | Payer: Medicare Other | Source: Ambulatory Visit | Attending: Radiation Oncology | Admitting: Radiation Oncology

## 2018-03-25 DIAGNOSIS — Z170 Estrogen receptor positive status [ER+]: Secondary | ICD-10-CM | POA: Diagnosis not present

## 2018-03-25 DIAGNOSIS — C50311 Malignant neoplasm of lower-inner quadrant of right female breast: Secondary | ICD-10-CM | POA: Diagnosis not present

## 2018-03-25 DIAGNOSIS — Z510 Encounter for antineoplastic radiation therapy: Secondary | ICD-10-CM | POA: Diagnosis not present

## 2018-03-28 ENCOUNTER — Ambulatory Visit
Admission: RE | Admit: 2018-03-28 | Discharge: 2018-03-28 | Disposition: A | Discharge status: Home / Self Care | Payer: Medicare Other | Source: Ambulatory Visit | Attending: Radiation Oncology | Admitting: Radiation Oncology

## 2018-03-28 DIAGNOSIS — C50311 Malignant neoplasm of lower-inner quadrant of right female breast: Secondary | ICD-10-CM | POA: Diagnosis not present

## 2018-03-28 DIAGNOSIS — Z170 Estrogen receptor positive status [ER+]: Secondary | ICD-10-CM | POA: Diagnosis not present

## 2018-03-28 DIAGNOSIS — Z510 Encounter for antineoplastic radiation therapy: Secondary | ICD-10-CM | POA: Diagnosis not present

## 2018-03-29 ENCOUNTER — Ambulatory Visit
Admission: RE | Admit: 2018-03-29 | Discharge: 2018-03-29 | Disposition: A | Discharge status: Home / Self Care | Payer: Medicare Other | Source: Ambulatory Visit | Attending: Radiation Oncology | Admitting: Radiation Oncology

## 2018-03-29 ENCOUNTER — Inpatient Hospital Stay (HOSPITAL_BASED_OUTPATIENT_CLINIC_OR_DEPARTMENT_OTHER): Payer: Medicare Other | Admitting: Hematology and Oncology

## 2018-03-29 DIAGNOSIS — Z510 Encounter for antineoplastic radiation therapy: Secondary | ICD-10-CM | POA: Diagnosis not present

## 2018-03-29 DIAGNOSIS — Z170 Estrogen receptor positive status [ER+]: Secondary | ICD-10-CM

## 2018-03-29 DIAGNOSIS — M810 Age-related osteoporosis without current pathological fracture: Secondary | ICD-10-CM | POA: Diagnosis not present

## 2018-03-29 DIAGNOSIS — C50311 Malignant neoplasm of lower-inner quadrant of right female breast: Secondary | ICD-10-CM | POA: Diagnosis not present

## 2018-03-29 MED ORDER — TAMOXIFEN CITRATE 20 MG PO TABS: 20.0000 mg | ORAL_TABLET | Freq: Every day | ORAL | 3 refills | Status: DC

## 2018-03-29 NOTE — Progress Notes (Signed)
Patient Care Team: Tower, Wynelle Fanny, MD as PCP - General Leandrew Koyanagi, MD as Referring Physician (Ophthalmology) Mauri Pole, MD as Consulting Physician (Gastroenterology)  DIAGNOSIS:  Encounter Diagnosis  Name Primary?  . Malignant neoplasm of lower-inner quadrant of right breast of female, estrogen receptor positive ([place])     SUMMARY OF ONCOLOGIC HISTORY:   Malignant neoplasm of lower-inner quadrant of right breast of female, estrogen receptor positive ([place])   01/31/2018 Initial Diagnosis    Right lumpectomy: IDC with DCIS 0.4 cm, margins negative, 0/2 lymph nodes negative, grade 2, ER 100%, PR 100%, HER-2 negative ratio 1.19, T1 a N0 stage I a      02/16/2018 Cancer Staging    Staging form: Breast, AJCC 8th Edition - Pathologic: Stage IA (pT1a, pN0, cM0, G2, ER+, PR+, HER2-) - Signed by Gardenia Phlegm, NP on 02/16/2018      03/03/2018 - 03/29/2018 Radiation Therapy    Adjuvant radiation therapy       CHIEF COMPLIANT: Follow-up after radiation therapy is complete  INTERVAL HISTORY: Cheryl Aguirre is a 72 year old with above-mentioned history of right breast cancer treated with lumpectomy and radiation and is here today to discuss the further adjuvant treatment plan.  She tolerated radiation fairly well with a mild radiation dermatitis.  She denies any significant fatigue.  REVIEW OF SYSTEMS:   Constitutional: Denies fevers, chills or abnormal weight loss Eyes: Denies blurriness of vision Ears, nose, mouth, throat, and face: Denies mucositis or sore throat Respiratory: Denies cough, dyspnea or wheezes Cardiovascular: Denies palpitation, chest discomfort Gastrointestinal:  Denies nausea, heartburn or change in bowel habits Skin: Denies abnormal skin rashes Lymphatics: Denies new lymphadenopathy or easy bruising Neurological:Denies numbness, tingling or new weaknesses Behavioral/Psych: Mood is stable, no new changes  Extremities: No lower extremity  edema Breast: Mild radiation dermatitis All other systems were reviewed with the patient and are negative.  I have reviewed the past medical history, past surgical history, social history and family history with the patient and they are unchanged from previous note.  ALLERGIES:  is allergic to adhesive [tape]; alendronate sodium; penicillins; varenicline tartrate; and doxycycline.  MEDICATIONS:  Current Outpatient Medications  Medication Sig Dispense Refill  . Calcium Citrate-Vitamin D (CITRACAL/VITAMIN D) 250-200 MG-UNIT TABS Take 4 tablets by mouth daily.     . cetirizine (ZYRTEC) 10 MG tablet Take 10 mg by mouth daily.      . cholecalciferol (VITAMIN D) 1000 units tablet Take 1 tablet by mouth daily.     . cyclobenzaprine (FLEXERIL) 10 MG tablet Take 1 tablet (10 mg total) by mouth at bedtime. (Patient taking differently: Take 10 mg by mouth daily as needed (migraine). ) 5 tablet 0  . escitalopram (LEXAPRO) 20 MG tablet Take 1 tablet (20 mg total) by mouth daily. 90 tablet 3  . fish oil-omega-3 fatty acids 1000 MG capsule Take 1 g by mouth daily.     Marland Kitchen ibuprofen (ADVIL,MOTRIN) 200 MG tablet Take 200 mg by mouth every 4 (four) hours as needed for headache.    . Lactobacillus (ACIDOPHILUS) 100 MG CAPS Take 1 capsule by mouth daily.      . Magnesium 250 MG TABS Take 1 tablet by mouth daily as needed (constipation).     . Melatonin 5 MG TABS Take 5 mg by mouth at bedtime. As directed at bedtime.     . montelukast (SINGULAIR) 10 MG tablet Take 1 tablet (10 mg total) by mouth at bedtime. 90 tablet 3  .  Multiple Vitamin (MULTIVITAMIN) tablet Take 1 tablet by mouth daily.      Marland Kitchen Phenylephrine-Bromphen-DM (PHENYLEPHRINE COMPLEX PO) Take 1 tablet by mouth every 4 (four) hours as needed (allergies). Take 1 tablet up to every 4 hours as needed     . SUMAtriptan (IMITREX) 50 MG tablet Take 50 mg by mouth daily as needed. May repeat in 2 hours if headache persists or recurs.     . tamoxifen (NOLVADEX)  20 MG tablet Take 1 tablet (20 mg total) by mouth daily. 90 tablet 3   No current facility-administered medications for this visit.     PHYSICAL EXAMINATION: ECOG PERFORMANCE STATUS: 1 - Symptomatic but completely ambulatory  Vitals:   03/29/18 1456  BP: (!) 151/69  Pulse: 96  Resp: 18  Temp: 98.1 F (36.7 C)  SpO2: 97%   Filed Weights   03/29/18 1456  Weight: 117 lb 6.4 oz (53.3 kg)    GENERAL:alert, no distress and comfortable SKIN: skin color, texture, turgor are normal, no rashes or significant lesions EYES: normal, Conjunctiva are pink and non-injected, sclera clear OROPHARYNX:no exudate, no erythema and lips, buccal mucosa, and tongue normal  NECK: supple, thyroid normal size, non-tender, without nodularity LYMPH:  no palpable lymphadenopathy in the cervical, axillary or inguinal LUNGS: clear to auscultation and percussion with normal breathing effort HEART: regular rate & rhythm and no murmurs and no lower extremity edema ABDOMEN:abdomen soft, non-tender and normal bowel sounds MUSCULOSKELETAL:no cyanosis of digits and no clubbing  NEURO: alert & oriented x 3 with fluent speech, no focal motor/sensory deficits EXTREMITIES: No lower extremity edema  LABORATORY DATA:  I have reviewed the data as listed CMP Latest Ref Rng & Units 01/21/2018 11/26/2017 11/20/2016  Glucose 65 - 99 mg/dL 102(H) 104(H) 112(H)  BUN 6 - 20 mg/dL _0 Creatinine 0.44 - 1.00 mg/dL 0.89 0.83 0.89  Sodium 135 - 145 mmol/L 138 138 138  Potassium 3.5 - 5.1 mmol/L 3.9 4.2 4.5  Chloride 101 - 111 mmol/L 102 101 104  CO2 22 - 32 mmol/L 25 32 30  Calcium 8.9 - 10.3 mg/dL 9.7 9.5 9.7  Total Protein 6.5 - 8.1 g/dL 6.9 7.0 7.2  Total Bilirubin 0.3 - 1.2 mg/dL 0.6 0.6 0.4  Alkaline Phos 38 - 126 U/L 82 82 97  AST 15 - 41 U/L _1 ALT 14 - 54 U/L _2 Lab Results  Component Value Date   WBC 7.7 01/21/2018   HGB 14.8 01/21/2018   HCT 43.8 01/21/2018   MCV 93.2 01/21/2018   PLT  240 01/21/2018   NEUTROABS 4.5 01/21/2018    ASSESSMENT & PLAN:  Malignant neoplasm of lower-inner quadrant of right breast of female, estrogen receptor positive ([place]) 01/31/2018: Right lumpectomy: IDC with DCIS 0.4 cm, margins negative, 0/2 lymph nodes negative, grade 2, ER 100%, PR 100%, HER-2 negative ratio 1.19, T1 a N0 stage I a  Adjuvant radiation therapy 03/03/2018-03/29/2018 Treatment plan: Tamoxifen 20 mg daily to start 04/11/2018 (because the patient has severe osteoporosis we are not using aromatase inhibitor therapy)  Tamoxifen counseling: We discussed the risks and benefits of tamoxifen. These include but not limited to insomnia, hot flashes, mood changes, vaginal dryness, and weight gain. Although rare, serious side effects including risk of blood clots were also discussed. We strongly believe that the benefits far outweigh the risks. Patient understands these risks and consented to starting treatment. Planned treatment duration is 10 years.  Return to  clinic in 3 months for survivorship care plan visit  No orders of the defined types were placed in this encounter.  The patient has a good understanding of the overall plan. she agrees with it. she will call with any problems that may develop before the next visit here.   Harriette Ohara, MD 03/29/18

## 2018-03-29 NOTE — Assessment & Plan Note (Signed)
01/31/2018: Right lumpectomy: IDC with DCIS 0.4 cm, margins negative, 0/2 lymph nodes negative, grade 2, ER 100%, PR 100%, HER-2 negative ratio 1.19, T1 a N0 stage I a  Adjuvant radiation therapy 03/03/2018-03/29/2018 Treatment plan: Letrozole 2.5 mg daily x5 years to start 04/11/2018  Letrozole counseling: We discussed the risks and benefits of anti-estrogen therapy with aromatase inhibitors. These include but not limited to insomnia, hot flashes, mood changes, vaginal dryness, bone density loss, and weight gain. We strongly believe that the benefits far outweigh the risks. Patient understands these risks and consented to starting treatment. Planned treatment duration is 5-7 years.  Return to clinic in 3 months for survivorship care plan visit

## 2018-03-30 ENCOUNTER — Telehealth: Payer: Self-pay | Admitting: Genetic Counselor

## 2018-03-30 ENCOUNTER — Encounter: Payer: Self-pay | Admitting: Genetic Counselor

## 2018-03-30 ENCOUNTER — Ambulatory Visit
Admission: RE | Admit: 2018-03-30 | Discharge: 2018-03-30 | Disposition: A | Discharge status: Home / Self Care | Payer: Medicare Other | Source: Ambulatory Visit | Attending: Radiation Oncology | Admitting: Radiation Oncology

## 2018-03-30 ENCOUNTER — Telehealth: Payer: Self-pay | Admitting: Adult Health

## 2018-03-30 ENCOUNTER — Ambulatory Visit: Payer: Medicare Other

## 2018-03-30 DIAGNOSIS — Z510 Encounter for antineoplastic radiation therapy: Secondary | ICD-10-CM | POA: Diagnosis not present

## 2018-03-30 DIAGNOSIS — Z1379 Encounter for other screening for genetic and chromosomal anomalies: Secondary | ICD-10-CM | POA: Insufficient documentation

## 2018-03-30 DIAGNOSIS — Z170 Estrogen receptor positive status [ER+]: Secondary | ICD-10-CM | POA: Diagnosis not present

## 2018-03-30 DIAGNOSIS — C50311 Malignant neoplasm of lower-inner quadrant of right female breast: Secondary | ICD-10-CM | POA: Diagnosis not present

## 2018-03-30 NOTE — Telephone Encounter (Signed)
Mailed patient calendar of upcoming September appointments.  °

## 2018-03-30 NOTE — Telephone Encounter (Signed)
LM on VM with good news.  Asked that she CB. 

## 2018-03-31 ENCOUNTER — Ambulatory Visit
Admission: RE | Admit: 2018-03-31 | Discharge: 2018-03-31 | Disposition: A | Discharge status: Home / Self Care | Payer: Medicare Other | Source: Ambulatory Visit | Attending: Radiation Oncology | Admitting: Radiation Oncology

## 2018-03-31 ENCOUNTER — Telehealth: Payer: Self-pay | Admitting: Genetic Counselor

## 2018-03-31 ENCOUNTER — Encounter: Payer: Self-pay | Admitting: *Deleted

## 2018-03-31 ENCOUNTER — Ambulatory Visit: Payer: Self-pay | Admitting: Genetic Counselor

## 2018-03-31 ENCOUNTER — Encounter: Payer: Self-pay | Admitting: Radiation Oncology

## 2018-03-31 DIAGNOSIS — Z170 Estrogen receptor positive status [ER+]: Secondary | ICD-10-CM | POA: Diagnosis not present

## 2018-03-31 DIAGNOSIS — C50311 Malignant neoplasm of lower-inner quadrant of right female breast: Secondary | ICD-10-CM | POA: Diagnosis not present

## 2018-03-31 DIAGNOSIS — Z1379 Encounter for other screening for genetic and chromosomal anomalies: Secondary | ICD-10-CM

## 2018-03-31 DIAGNOSIS — Z510 Encounter for antineoplastic radiation therapy: Secondary | ICD-10-CM | POA: Diagnosis not present

## 2018-03-31 NOTE — Progress Notes (Signed)
HPI:  Ms. Bovard was previously seen in the [place] clinic due to a personal and family history of cancer and concerns regarding a hereditary predisposition to cancer. Please refer to our prior cancer genetics clinic note for more information regarding Ms. Mauri's medical, social and family histories, and our assessment and recommendations, at the time. Ms. Sistare recent genetic test results were disclosed to her, as were recommendations warranted by these results. These results and recommendations are discussed in more detail below.  CANCER HISTORY:    Malignant neoplasm of lower-inner quadrant of right breast of female, estrogen receptor positive ([place])   01/31/2018 Initial Diagnosis    Right lumpectomy: IDC with DCIS 0.4 cm, margins negative, 0/2 lymph nodes negative, grade 2, ER 100%, PR 100%, HER-2 negative ratio 1.19, T1 a N0 stage I a      02/16/2018 Cancer Staging    Staging form: Breast, AJCC 8th Edition - Pathologic: Stage IA (pT1a, pN0, cM0, G2, ER+, PR+, HER2-) - Signed by Gardenia Phlegm, NP on 02/16/2018      03/03/2018 - 03/29/2018 Radiation Therapy    Adjuvant radiation therapy      03/29/2018 Genetic Testing    Negative genetic testing on the multi-cancer panel.  The Multi-Gene Panel offered by [facility] includes sequencing and/or deletion duplication testing of the following 83 genes: ALK, APC, ATM, AXIN2,BAP1,  BARD1, BLM, BMPR1A, BRCA1, BRCA2, BRIP1, CASR, CDC73, CDH1, CDK4, CDKN1B, CDKN1C, CDKN2A (p14ARF), CDKN2A (p16INK4a), CEBPA, CHEK2, CTNNA1, DICER1, DIS3L2, EGFR (c.2369C>T, p.Thr790Met variant only), EPCAM (Deletion/duplication testing only), FH, FLCN, GATA2, GPC3, GREM1 (Promoter region deletion/duplication testing only), HOXB13 (c.251G>A, p.Gly84Glu), HRAS, KIT, MAX, MEN1, MET, MITF (c.952G>A, p.Glu318Lys variant only), MLH1, MSH2, MSH3, MSH6, MUTYH, NBN, NF1, NF2, NTHL1, PALB2, PDGFRA, PHOX2B, PMS2, POLD1, POLE, POT1, PRKAR1A, PTCH1, PTEN,  RAD50, RAD51C, RAD51D, RB1, RECQL4, RET, RUNX1, SDHAF2, SDHA (sequence changes only), SDHB, SDHC, SDHD, SMAD4, SMARCA4, SMARCB1, SMARCE1, STK11, SUFU, TERT, TERT, TMEM127, TP53, TSC1, TSC2, VHL, WRN and WT1.  The report date is March 29, 2018.       FAMILY HISTORY:  We obtained a detailed, 4-generation family history.  Significant diagnoses are listed below: Family History  Problem Relation Age of Onset  . Heart disease Other   . Cancer Mother        pancreatic, liver  . Cancer Father        colon  . Colon cancer Father 53  . Thyroid disease Sister   . Colon polyps Sister   . Irritable bowel syndrome Sister   . Breast cancer Sister 24  . Melanoma Sister 28  . Breast cancer Maternal Aunt        dx under 90  . Colon cancer Paternal Grandmother        dx in her 21s  . Thyroid cancer Paternal Grandmother        dx in her 41s  . Miscarriages / [place] Cousin   . Diabetes Cousin   . Breast cancer Cousin 33  . Thyroid disease Sister   . Skin cancer Brother   . Thyroid disease Sister   . Glaucoma Maternal Aunt      GENETIC TEST RESULTS: Genetic testing reported out on March 29, 2018 through the Multi-cancer panel found no deleterious mutations.  The Multi-Gene Panel offered by [facility] includes sequencing and/or deletion duplication testing of the following 83 genes: ALK, APC, ATM, AXIN2,BAP1,  BARD1, BLM, BMPR1A, BRCA1, BRCA2, BRIP1, CASR, CDC73, CDH1, CDK4, CDKN1B, CDKN1C, CDKN2A (p14ARF), CDKN2A (p16INK4a),  CEBPA, CHEK2, CTNNA1, DICER1, DIS3L2, EGFR (c.2369C>T, p.Thr790Met variant only), EPCAM (Deletion/duplication testing only), FH, FLCN, GATA2, GPC3, GREM1 (Promoter region deletion/duplication testing only), HOXB13 (c.251G>A, p.Gly84Glu), HRAS, KIT, MAX, MEN1, MET, MITF (c.952G>A, p.Glu318Lys variant only), MLH1, MSH2, MSH3, MSH6, MUTYH, NBN, NF1, NF2, NTHL1, PALB2, PDGFRA, PHOX2B, PMS2, POLD1, POLE, POT1, PRKAR1A, PTCH1, PTEN, RAD50, RAD51C, RAD51D, RB1, RECQL4, RET, RUNX1, SDHAF2,  SDHA (sequence changes only), SDHB, SDHC, SDHD, SMAD4, SMARCA4, SMARCB1, SMARCE1, STK11, SUFU, TERT, TERT, TMEM127, TP53, TSC1, TSC2, VHL, WRN and WT1.  The test report has been scanned into EPIC and is located under the Molecular Pathology section of the Results Review tab.    We discussed with Ms. Osentoski that since the current genetic testing is not perfect, it is possible there may be a gene mutation in one of these genes that current testing cannot detect, but that chance is small.  We also discussed, that it is possible that another gene that has not yet been discovered, or that we have not yet tested, is responsible for the cancer diagnoses in the family, and it is, therefore, important to remain in touch with cancer genetics in the future so that we can continue to offer Ms. Lanpher the most up to date genetic testing.   CANCER SCREENING RECOMMENDATIONS:  This result is reassuring and indicates that Ms. Velasquez likely does not have an increased risk for a future cancer due to a mutation in one of these genes. This normal test also suggests that Ms. Coull's cancer was most likely not due to an inherited predisposition associated with one of these genes.  Most cancers happen by chance and this negative test suggests that her cancer falls into this category.  We, therefore, recommended she continue to follow the cancer management and screening guidelines provided by her oncology and primary healthcare provider.   An individual's cancer risk and medical management are not determined by genetic test results alone. Overall cancer risk assessment incorporates additional factors, including personal medical history, family history, and any available genetic information that may result in a personalized plan for cancer prevention and surveillance.  RECOMMENDATIONS FOR FAMILY MEMBERS:  Women in this family might be at some increased risk of developing cancer, over the general population risk, simply due  to the family history of cancer.  We recommended women in this family have a yearly mammogram beginning at age 68, or 46 years younger than the earliest onset of cancer, an annual clinical breast exam, and perform monthly breast self-exams. Women in this family should also have a gynecological exam as recommended by their primary provider. All family members should have a colonoscopy by age 1.  FOLLOW-UP: Lastly, we discussed with Ms. Heinle that cancer genetics is a rapidly advancing field and it is possible that new genetic tests will be appropriate for her and/or her family members in the future. We encouraged her to remain in contact with cancer genetics on an annual basis so we can update her personal and family histories and let her know of advances in cancer genetics that may benefit this family.   Our contact number was provided. Ms. Freeman questions were answered to her satisfaction, and she knows she is welcome to call us at anytime with additional questions or concerns.   Roma Kayser, MS, [facility] Certified Genetic Counselor [email or web address]

## 2018-03-31 NOTE — Telephone Encounter (Signed)
Revealed negative genetic testing.  Discussed that we do not know why she has breast cancer or why there is cancer in the family. It could be due to a different gene that we are not testing, or maybe our current technology may not be able to pick something up.  It will be important for her to keep in contact with genetics to keep up with whether additional testing may be needed. 

## 2018-04-06 NOTE — Progress Notes (Signed)
  Radiation Oncology         [phone] ________________________________  Name: Cheryl Aguirre MRN: [id number]  Date: 03/31/2018  DOB: 10-Jun-1946  End of Treatment Note  Diagnosis:   72 y.o. female with Stage IA, pT1aN0M0, grade 2, ER/PR positive invasive ductal carcinoma with DCIS of the right breast    Indication for treatment:  Curative       Radiation treatment dates:   03/03/2018 - 03/31/2018  Site/dose:    1. Right Breast / 42.56 Gy in 16 fractions 2. Right Breast Boost / 8 Gy in 4 fractions  Beams/energy:    1. 3D / 6X Photon 2. 3D / 12E Electron  Narrative: The patient tolerated radiation treatment relatively well.  She did experience mild fatigue and some expected skin irritation as she progressed through treatment with mild-moderate erythema but no desquamation. She is applying Radiaplex to her skin as ordered.  Plan: The patient has completed radiation treatment. The patient will return to radiation oncology clinic for routine followup in one month. I advised them to call or return sooner if they have any questions or concerns related to their recovery or treatment.  ------------------------------------------------  Jodelle Gross, MD, PhD  This document serves as a record of services personally performed by Kyung Rudd, MD. It was created on his behalf by Rae Lips, a trained medical scribe. The creation of this record is based on the scribe's personal observations and the provider's statements to them. This document has been checked and approved by the attending provider.

## 2018-05-02 ENCOUNTER — Ambulatory Visit
Admission: RE | Admit: 2018-05-02 | Discharge: 2018-05-02 | Disposition: A | Discharge status: Home / Self Care | Payer: Medicare Other | Source: Ambulatory Visit | Attending: Radiation Oncology | Admitting: Radiation Oncology

## 2018-05-02 ENCOUNTER — Encounter: Payer: Self-pay | Admitting: Radiation Oncology

## 2018-05-02 VITALS — BP 138/71 | HR 92 | Temp 98.4°F | Resp 18 | Ht 63.0 in | Wt 117.0 lb

## 2018-05-02 DIAGNOSIS — Z880 Allergy status to penicillin: Secondary | ICD-10-CM | POA: Diagnosis not present

## 2018-05-02 DIAGNOSIS — Z79899 Other long term (current) drug therapy: Secondary | ICD-10-CM | POA: Insufficient documentation

## 2018-05-02 DIAGNOSIS — Z170 Estrogen receptor positive status [ER+]: Secondary | ICD-10-CM | POA: Diagnosis not present

## 2018-05-02 DIAGNOSIS — Z888 Allergy status to other drugs, medicaments and biological substances status: Secondary | ICD-10-CM | POA: Insufficient documentation

## 2018-05-02 DIAGNOSIS — Z881 Allergy status to other antibiotic agents status: Secondary | ICD-10-CM | POA: Diagnosis not present

## 2018-05-02 DIAGNOSIS — C50311 Malignant neoplasm of lower-inner quadrant of right female breast: Secondary | ICD-10-CM

## 2018-05-02 DIAGNOSIS — Z923 Personal history of irradiation: Secondary | ICD-10-CM | POA: Insufficient documentation

## 2018-05-02 DIAGNOSIS — C50911 Malignant neoplasm of unspecified site of right female breast: Secondary | ICD-10-CM | POA: Insufficient documentation

## 2018-05-02 NOTE — Progress Notes (Signed)
Radiation Oncology         [phone] ________________________________  Name: Cheryl Aguirre MRN: [id number]  Date of Service: 05/02/2018 DOB: [birth year]  Post Treatment Note  CC: Tower, Wynelle Fanny, MD  Autumn Messing III, MD  Diagnosis:  Stage IA, pT1aN0M0, grade 2, ER/PR positive invasive ductal carcinoma with DCIS of the right breast     Interval Since Last Radiation: 5 weeks   03/03/2018 - 03/31/2018: 1. Right Breast / 42.56 Gy in 16 fractions 2. Right Breast Boost / 8 Gy in 4 fractions    Narrative:  The patient returns today for routine follow-up.  She tolerated radiotherapy well but did have a brisk skin reaction. No wet desquamation was noted however.                              On review of systems, the patient states she is doing much better since radiation ended. She's using vitamin E containing Bio Oil on her breast. She has not started Tamoxifen yet but is concerned about possible side effects. No other complaints are noted.  ALLERGIES:  is allergic to adhesive [tape]; alendronate sodium; penicillins; varenicline tartrate; and doxycycline.  Meds: Current Outpatient Medications  Medication Sig Dispense Refill  . Calcium Citrate-Vitamin D (CITRACAL/VITAMIN D) 250-200 MG-UNIT TABS Take 4 tablets by mouth daily.     . cetirizine (ZYRTEC) 10 MG tablet Take 10 mg by mouth daily.      . cholecalciferol (VITAMIN D) 1000 units tablet Take 1 tablet by mouth daily.     . cyclobenzaprine (FLEXERIL) 10 MG tablet Take 1 tablet (10 mg total) by mouth at bedtime. (Patient taking differently: Take 10 mg by mouth daily as needed (migraine). ) 5 tablet 0  . escitalopram (LEXAPRO) 20 MG tablet Take 1 tablet (20 mg total) by mouth daily. 90 tablet 3  . fish oil-omega-3 fatty acids 1000 MG capsule Take 1 g by mouth daily.     Marland Kitchen ibuprofen (ADVIL,MOTRIN) 200 MG tablet Take 200 mg by mouth every 4 (four) hours as needed for headache.    . Lactobacillus (ACIDOPHILUS) 100 MG CAPS Take 1  capsule by mouth daily.      . Magnesium 250 MG TABS Take 1 tablet by mouth daily as needed (constipation).     . Melatonin 5 MG TABS Take 5 mg by mouth at bedtime. As directed at bedtime.     . montelukast (SINGULAIR) 10 MG tablet Take 1 tablet (10 mg total) by mouth at bedtime. 90 tablet 3  . Multiple Vitamin (MULTIVITAMIN) tablet Take 1 tablet by mouth daily.      Marland Kitchen Phenylephrine-Bromphen-DM (PHENYLEPHRINE COMPLEX PO) Take 1 tablet by mouth every 4 (four) hours as needed (allergies). Take 1 tablet up to every 4 hours as needed     . SUMAtriptan (IMITREX) 50 MG tablet Take 50 mg by mouth daily as needed. May repeat in 2 hours if headache persists or recurs.     . tamoxifen (NOLVADEX) 20 MG tablet Take 1 tablet (20 mg total) by mouth daily. 90 tablet 3   No current facility-administered medications for this encounter.     Physical Findings:  vitals were not taken for this visit.   /10 In general this is a well appearing caucasian female in no acute distress. She's alert and oriented x4 and appropriate throughout the examination. Cardiopulmonary assessment is negative for acute distress and she exhibits normal effort. The right  breast is intact with mild hyperpigmentation along the medial chest at a site where she notes prior desquamation. No cellulitic changes are appreciated.  Lab Findings: Lab Results  Component Value Date   WBC 7.7 01/21/2018   HGB 14.8 01/21/2018   HCT 43.8 01/21/2018   MCV 93.2 01/21/2018   PLT 240 01/21/2018     Radiographic Findings: No results found.  Impression/Plan: 1.  Stage IA, pT1aN0M0, grade 2, ER/PR positive invasive ductal carcinoma with DCIS of the right breast. The patient is recovering well from the effects of radiotherapy and we will see her back as needed moving forward. She will begin Tamoxifen soon and notify Dr. Lindi Adie if she has any concerns about side effects of the medication.  2. Survivorship. She has an appointment in September for  [facility] and we did also review the activities going on each month at the cancer center which support survivorship goals.          Carola Rhine, PAC

## 2018-06-27 ENCOUNTER — Encounter: Payer: Self-pay | Admitting: Adult Health

## 2018-06-27 ENCOUNTER — Inpatient Hospital Stay: Payer: Medicare Other | Attending: Hematology and Oncology | Admitting: Adult Health

## 2018-06-27 ENCOUNTER — Telehealth: Payer: Self-pay | Admitting: Adult Health

## 2018-06-27 VITALS — BP 139/66 | HR 86 | Temp 98.1°F | Resp 18 | Ht 63.0 in | Wt 118.3 lb

## 2018-06-27 DIAGNOSIS — K5900 Constipation, unspecified: Secondary | ICD-10-CM | POA: Diagnosis not present

## 2018-06-27 DIAGNOSIS — Z170 Estrogen receptor positive status [ER+]: Secondary | ICD-10-CM | POA: Insufficient documentation

## 2018-06-27 DIAGNOSIS — M8588 Other specified disorders of bone density and structure, other site: Secondary | ICD-10-CM

## 2018-06-27 DIAGNOSIS — C50311 Malignant neoplasm of lower-inner quadrant of right female breast: Secondary | ICD-10-CM | POA: Insufficient documentation

## 2018-06-27 DIAGNOSIS — Z122 Encounter for screening for malignant neoplasm of respiratory organs: Secondary | ICD-10-CM

## 2018-06-27 NOTE — Progress Notes (Signed)
CLINIC:  Survivorship   REASON FOR VISIT:  Routine follow-up post-treatment for a recent history of breast cancer.  BRIEF ONCOLOGIC HISTORY:    Malignant neoplasm of lower-inner quadrant of right breast of female, estrogen receptor positive ([place])   01/31/2018 Initial Diagnosis    Right lumpectomy: IDC with DCIS 0.4 cm, margins negative, 0/2 lymph nodes negative, grade 2, ER 100%, PR 100%, HER-2 negative ratio 1.19, T1 a N0 stage I a    02/16/2018 Cancer Staging    Staging form: Breast, AJCC 8th Edition - Pathologic: Stage IA (pT1a, pN0, cM0, G2, ER+, PR+, HER2-) - Signed by Gardenia Phlegm, NP on 02/16/2018    03/03/2018 - 03/29/2018 Radiation Therapy    Adjuvant radiation therapy    03/29/2018 Genetic Testing    Negative genetic testing on the multi-cancer panel.  The Multi-Gene Panel offered by [facility] includes sequencing and/or deletion duplication testing of the following 83 genes: ALK, APC, ATM, AXIN2,BAP1,  BARD1, BLM, BMPR1A, BRCA1, BRCA2, BRIP1, CASR, CDC73, CDH1, CDK4, CDKN1B, CDKN1C, CDKN2A (p14ARF), CDKN2A (p16INK4a), CEBPA, CHEK2, CTNNA1, DICER1, DIS3L2, EGFR (c.2369C>T, p.Thr790Met variant only), EPCAM (Deletion/duplication testing only), FH, FLCN, GATA2, GPC3, GREM1 (Promoter region deletion/duplication testing only), HOXB13 (c.251G>A, p.Gly84Glu), HRAS, KIT, MAX, MEN1, MET, MITF (c.952G>A, p.Glu318Lys variant only), MLH1, MSH2, MSH3, MSH6, MUTYH, NBN, NF1, NF2, NTHL1, PALB2, PDGFRA, PHOX2B, PMS2, POLD1, POLE, POT1, PRKAR1A, PTCH1, PTEN, RAD50, RAD51C, RAD51D, RB1, RECQL4, RET, RUNX1, SDHAF2, SDHA (sequence changes only), SDHB, SDHC, SDHD, SMAD4, SMARCA4, SMARCB1, SMARCE1, STK11, SUFU, TERT, TERT, TMEM127, TP53, TSC1, TSC2, VHL, WRN and WT1.  The report date is March 29, 2018.    03/2018 -  Anti-estrogen oral therapy    Tamoxifen daily     INTERVAL HISTORY:  Ms. Kuri presents to the [facility] today for our initial meeting to review her survivorship care plan  detailing her treatment course for breast cancer, as well as monitoring long-term side effects of that treatment, education regarding health maintenance, screening, and overall wellness and health promotion.     Overall, Ms. Goodridge reports feeling quite well.  She is taking Tamoxifen daily.  She notes that she is tolerating it well.  She does feel hot, but says that she always feels that way.  Emerson still has shooting pain at her surgical site along with tenderness throughout her entire breast.  She is also having difficulty with indigestion, particularly with radiation.  It has improved, some, but is still intermittent.  She also is having constipation she struggles with.   She is a current every day smoker of over one pack per day for about 53 years.     REVIEW OF SYSTEMS:  Review of Systems  Constitutional: Negative for appetite change, chills, fatigue, fever and unexpected weight change.  HENT:   Negative for hearing loss, lump/mass and trouble swallowing.   Eyes: Negative for eye problems and icterus.  Cardiovascular: Negative for chest pain, leg swelling and palpitations.  Gastrointestinal: Positive for constipation. Negative for abdominal distention, abdominal pain, blood in stool, diarrhea, nausea and vomiting.  Endocrine: Negative for hot flashes.  Skin: Negative for itching and rash.  Neurological: Negative for dizziness, extremity weakness, headaches and numbness.  Hematological: Negative for adenopathy. Does not bruise/bleed easily.  Psychiatric/Behavioral: Negative for depression. The patient is not nervous/anxious.   Breast: Denies any new nodularity, masses, tenderness, nipple changes, or nipple discharge.      ONCOLOGY TREATMENT TEAM:  1. Surgeon:  Dr. Marlou Starks at [facility] 2. Medical Oncologist: Dr.  Gudena  3. Radiation Oncologist: Dr. Lisbeth Renshaw    PAST MEDICAL/SURGICAL HISTORY:  Past Medical History:  Diagnosis Date  . Anxiety   . Arthritis    left hip    . Chronic headaches   . Colon polyps   . Depression   . Family history of breast cancer   . Family history of colon cancer   . Family history of pancreatic cancer   . Family history of thyroid cancer   . GERD (gastroesophageal reflux disease)   . Hypertension    elevated at times.  no meds  . IBS (irritable bowel syndrome)   . Insomnia   . Osteopenia   . Tobacco abuse    Past Surgical History:  Procedure Laterality Date  . ABDOMINAL HYSTERECTOMY     bleeding, fibroid, endometriosis (total)  . BREAST LUMPECTOMY WITH RADIOACTIVE SEED AND SENTINEL LYMPH NODE BIOPSY Right 01/31/2018   Procedure: BREAST LUMPECTOMY WITH RADIOACTIVE SEED AND SENTINEL LYMPH NODE BIOPSY;  Surgeon: Jovita Kussmaul, MD;  Location: [place];  Service: General;  Laterality: Right;  . CATARACT EXTRACTION W/PHACO Left 02/11/2017   Procedure: CATARACT EXTRACTION PHACO AND INTRAOCULAR LENS PLACEMENT (IOC);  Surgeon: Leandrew Koyanagi, MD;  Location: [facility];  Service: Ophthalmology;  Laterality: Left;  [place] 1:02.8 AP% 13.7 CDE 8.58 FLUID PACK LOT # [id number]  . CATARACT EXTRACTION W/PHACO Right 03/10/2017   Procedure: CATARACT EXTRACTION PHACO AND INTRAOCULAR LENS PLACEMENT ([place]) Right symfony toric lens;  Surgeon: Leandrew Koyanagi, MD;  Location: [place];  Service: Ophthalmology;  Laterality: Right;  symfony toric lens prefers early  . CT of chest  10/09   bronchiectasis  . ENDOMETRIAL BIOPSY    . LAPAROSCOPY  1984     ALLERGIES:  Allergies  Allergen Reactions  . Adhesive [Tape]     Tears skin  . Alendronate Sodium     REACTION: muscle, joint pain, indigestion  . Penicillins Other (See Comments)    Has patient had a PCN reaction causing immediate rash, facial/tongue/throat swelling, SOB or lightheadedness with hypotension: yes Has patient had a PCN reaction causing severe rash involving mucus membranes or skin necrosis: no Has patient had a PCN reaction that required hospitalization: no Has  patient had a PCN reaction occurring within the last 10 years: no If all of the above answers are "NO", then may proceed with Cephalosporin use. **CHILDHOOD**  . Varenicline Tartrate Nausea Only       . Doxycycline Nausea And Vomiting and Rash          CURRENT MEDICATIONS:  Outpatient Encounter Medications as of 06/27/2018  Medication Sig Note  . Calcium Citrate-Vitamin D (CITRACAL/VITAMIN D) 250-200 MG-UNIT TABS Take 4 tablets by mouth daily.    . cetirizine (ZYRTEC) 10 MG tablet Take 10 mg by mouth daily.     . cholecalciferol (VITAMIN D) 1000 units tablet Take 1 tablet by mouth daily.    . cyclobenzaprine (FLEXERIL) 10 MG tablet Take 1 tablet (10 mg total) by mouth at bedtime. (Patient taking differently: Take 10 mg by mouth daily as needed (migraine). )   . escitalopram (LEXAPRO) 20 MG tablet Take 1 tablet (20 mg total) by mouth daily.   . fish oil-omega-3 fatty acids 1000 MG capsule Take 1 g by mouth daily.    Marland Kitchen ibuprofen (ADVIL,MOTRIN) 200 MG tablet Take 200 mg by mouth every 4 (four) hours as needed for headache.   . Lactobacillus (ACIDOPHILUS) 100 MG CAPS Take 1 capsule by mouth daily.     Marland Kitchen  Magnesium 250 MG TABS Take 1 tablet by mouth daily as needed (constipation).    . Melatonin 5 MG TABS Take 5 mg by mouth at bedtime. As directed at bedtime.    . montelukast (SINGULAIR) 10 MG tablet Take 1 tablet (10 mg total) by mouth at bedtime.   . Multiple Vitamin (MULTIVITAMIN) tablet Take 1 tablet by mouth daily.     Marland Kitchen Phenylephrine-Bromphen-DM (PHENYLEPHRINE COMPLEX PO) Take 1 tablet by mouth every 4 (four) hours as needed (allergies). Take 1 tablet up to every 4 hours as needed    . SUMAtriptan (IMITREX) 50 MG tablet Take 50 mg by mouth daily as needed. May repeat in 2 hours if headache persists or recurs.  02/15/2018: Only when she has a migraine  . tamoxifen (NOLVADEX) 20 MG tablet Take 1 tablet (20 mg total) by mouth daily.    No facility-administered encounter medications on file as  of 06/27/2018.      ONCOLOGIC FAMILY HISTORY:  Family History  Problem Relation Age of Onset  . Heart disease Other   . Cancer Mother        pancreatic, liver  . Cancer Father        colon  . Colon cancer Father 35  . Thyroid disease Sister   . Colon polyps Sister   . Irritable bowel syndrome Sister   . Breast cancer Sister 52  . Melanoma Sister 107  . Breast cancer Maternal Aunt        dx under 57  . Colon cancer Paternal Grandmother        dx in her 73s  . Thyroid cancer Paternal Grandmother        dx in her 80s  . Miscarriages / [place] Cousin   . Diabetes Cousin   . Breast cancer Cousin 39  . Thyroid disease Sister   . Skin cancer Brother   . Thyroid disease Sister   . Glaucoma Maternal Aunt      GENETIC COUNSELING/TESTING: negative  SOCIAL HISTORY:  Social History   Socioeconomic History  . Marital status: Married    Spouse name: Not on file  . Number of children: 1  . Years of education: Not on file  . Highest education level: Not on file  Occupational History  . Occupation: Merchandiser, retail: UNEMPLOYED  Social Needs  . Financial resource strain: Not on file  . Food insecurity:    Worry: Not on file    Inability: Not on file  . Transportation needs:    Medical: Not on file    Non-medical: Not on file  Tobacco Use  . Smoking status: Current Every Day Smoker    Packs/day: 1.00    Years: 56.00    Pack years: 56.00    Types: Cigarettes  . Smokeless tobacco: Never Used  Substance and Sexual Activity  . Alcohol use: Yes    Alcohol/week: 2.0 standard drinks    Types: 1 Glasses of wine, 1 Cans of beer per week    Comment: occ  . Drug use: No  . Sexual activity: Yes  Lifestyle  . Physical activity:    Days per week: Not on file    Minutes per session: Not on file  . Stress: Not on file  Relationships  . Social connections:    Talks on phone: Not on file    Gets together: Not on file    Attends religious service: Not on file     Active member of club  or organization: Not on file    Attends meetings of clubs or organizations: Not on file    Relationship status: Not on file  . Intimate partner violence:    Fear of current or ex partner: Not on file    Emotionally abused: Not on file    Physically abused: Not on file    Forced sexual activity: Not on file  Other Topics Concern  . Not on file  Social History Narrative   Daily Caffeine Use:  2 daily      PHYSICAL EXAMINATION:  Vital Signs:   Vitals:   06/27/18 1410  BP: 139/66  Pulse: 86  Resp: 18  Temp: 98.1 F (36.7 C)  SpO2: 99%   Filed Weights   06/27/18 1410  Weight: 118 lb 4.8 oz (53.7 kg)   General: Well-nourished, well-appearing female in no acute distress.  She is accompanied in clinic by her husband today.   HEENT: Head is normocephalic.  Pupils equal and reactive to light. Conjunctivae clear without exudate.  Sclerae anicteric. Oral mucosa is pink, moist.  Oropharynx is pink without lesions or erythema.  Lymph: No cervical, supraclavicular, or infraclavicular lymphadenopathy noted on palpation.  Cardiovascular: Regular rate and rhythm.Marland Kitchen Respiratory: Clear to auscultation bilaterally. Chest expansion symmetric; breathing non-labored.  Breasts: right breast s/p lumpectomy, mild amt of scar tissue present, radiation changes noted, no nodules or masses noted; left breast without nodules, masses, skin or nipple changes GI: Abdomen soft and round; non-tender, non-distended. Bowel sounds normoactive.  GU: Deferred.  Neuro: No focal deficits. Steady gait.  Psych: Mood and affect normal and appropriate for situation.  Extremities: No edema. MSK: No focal spinal tenderness to palpation.  Full range of motion in bilateral upper extremities Skin: Warm and dry.  LABORATORY DATA:  None for this visit.  DIAGNOSTIC IMAGING:  None for this visit.      ASSESSMENT AND PLAN:  Ms.. Kolarik is a pleasant 72 y.o. female with Stage IA right breast  invasive ductal carcinoma, ER+/PR+/HER2-, diagnosed in 01/2018, treated with lumpectomy, adjuvant radiation therapy, and anti-estrogen therapy with Tamoxifen beginning in 03/2018.  She presents to the Survivorship Clinic for our initial meeting and routine follow-up post-completion of treatment for breast cancer.    1. Stage IA right breast cancer:  Ms. Borys is continuing to recover from definitive treatment for breast cancer. She will follow-up with her medical oncologist, Dr. Lindi Adie in 6 months with history and physical exam per surveillance protocol.  She will continue her anti-estrogen therapy with Tamoxifen.  She is due for mammogram in 12/2018 and I ordered this today.  Thus far, she is tolerating the Tamoxifen well, with minimal side effects.  Today, a comprehensive survivorship care plan and treatment summary was reviewed with the patient today detailing her breast cancer diagnosis, treatment course, potential late/long-term effects of treatment, appropriate follow-up care with recommendations for the future, and patient education resources.  A copy of this summary, along with a letter will be sent to the patient's primary care provider via mail/fax/In Basket message after today's visit.    2. Constipation: Miralax PRN if needed  3. Bone health:  Given Ms. Mcglothlin's age/history of breast cancer and tobacco use, she is at risk for bone demineralization.  I counseled her that Tamoxfien has a protective effect on the bones.  She was given education on specific activities to promote bone health.  Typically, I will defer to PCP regarding future bone density testing and management, but since patient hasn't been  tested in a few years, I am happy to order bone density testing at time of mammograms at her request.    4. Cancer screening:  Due to Ms. Broom's history and her age, she should receive screening for skin cancers, colon cancer, and lung cancer.  The information and recommendations are listed  on the patient's comprehensive care plan/treatment summary and were reviewed in detail with the patient.  I ordered CT lung cancer screening today.    5. Health maintenance and wellness promotion: Ms. Volk was encouraged to consume 5-7 servings of fruits and vegetables per day. We reviewed the "Nutrition Rainbow" handout, as well as the handout "Take Control of Your Health and Reduce Your Cancer Risk" from the [place].  She was also encouraged to engage in moderate to vigorous exercise for 30 minutes per day most days of the week. We discussed the [employer] fitness program, which is designed for cancer survivors to help them become more physically fit after cancer treatments.  She was instructed to limit her alcohol consumption and I offered to assist in helping her quit smoking when/if she is ready.     6. Support services/counseling: It is not uncommon for this period of the patient's cancer care trajectory to be one of many emotions and stressors.  We discussed an opportunity for her to participate in the next session of [facility] ("Finding Your New Normal") support group series designed for patients after they have completed treatment.   Ms. Capers was encouraged to take advantage of our many other support services programs, support groups, and/or counseling in coping with her new life as a cancer survivor after completing anti-cancer treatment.  She was given information regarding our available services and encouraged to contact me with any questions or for help enrolling in any of our support group/programs.    Dispo:   -Return to cancer center for f/u with Dr. Lindi Adie in 6 months -Mammogram due in 12/2018 -Bone density 12/2018 -CT lung cancer screening -She is welcome to return back to the Survivorship Clinic at any time; no additional follow-up needed at this time.  -Consider referral back to survivorship as a long-term survivor for continued surveillance  A total of (30)  minutes of face-to-face time was spent with this patient with greater than 50% of that time in counseling and care-coordination.   Gardenia Phlegm, NP Survivorship Program [facility] [phone]   Note: PRIMARY CARE PROVIDER Abner Greenspan, [place] [phone]

## 2018-06-27 NOTE — Telephone Encounter (Signed)
Gave avs and calendar ° °

## 2018-07-12 HISTORY — PX: BASAL CELL CARCINOMA EXCISION: SHX1214

## 2018-07-19 ENCOUNTER — Telehealth: Payer: Self-pay | Admitting: *Deleted

## 2018-07-19 DIAGNOSIS — L989 Disorder of the skin and subcutaneous tissue, unspecified: Secondary | ICD-10-CM | POA: Insufficient documentation

## 2018-07-19 NOTE — Telephone Encounter (Signed)
Copied from [place] [id number]. Topic: Referral - Request >> Jul 19, 2018  2:59 PM Mcneil, Ja-Kwan wrote: Reason for CRM: Pt states she needs a referral to a Dermatologist who also does plastic surgery due to spot on her check under the left eye that looks like a blister that appears to be getting bigger. Pt states it has been there for a while and she would like a referral to a Dermatologist. Cb# [phone]

## 2018-07-19 NOTE — Telephone Encounter (Signed)
Ref done  Will route to [facility] 

## 2018-07-21 NOTE — Telephone Encounter (Signed)
Spoke with patient. Patient wants to know who you would recommend personally for her to see. She wants a good dermatologist or a dermatologist that also is Psychiatric nurse if possible due to where the blister/spot is. She had a placed removed on her leg in the past by dermatologist and it had to be done 3 times and a big scar/spot is left from that. She wants to have minimal scar from this, if it has to be removed. Please review-Koa Palla V Edvin Albus, RMA

## 2018-07-22 NOTE — Telephone Encounter (Signed)
Cheryl Aguirre-was she ok with the practice we discussed? Thanks

## 2018-07-25 NOTE — Telephone Encounter (Signed)
Thanks

## 2018-07-25 NOTE — Telephone Encounter (Signed)
Scheduled patient an Appt with Dr Cindie Crumbly at [facility]. Also told her about getting referral to Dr Harlow Mares Plastic Surgery if it is needed but patient needs to see Dermatology first and she understood and was ok seeing Dr Pearline Cables.

## 2018-07-27 DIAGNOSIS — C44319 Basal cell carcinoma of skin of other parts of face: Secondary | ICD-10-CM | POA: Diagnosis not present

## 2018-07-27 DIAGNOSIS — D485 Neoplasm of uncertain behavior of skin: Secondary | ICD-10-CM | POA: Diagnosis not present

## 2018-08-03 DIAGNOSIS — C44319 Basal cell carcinoma of skin of other parts of face: Secondary | ICD-10-CM | POA: Diagnosis not present

## 2018-10-19 DIAGNOSIS — C44319 Basal cell carcinoma of skin of other parts of face: Secondary | ICD-10-CM | POA: Diagnosis not present

## 2018-11-22 DIAGNOSIS — C44319 Basal cell carcinoma of skin of other parts of face: Secondary | ICD-10-CM | POA: Diagnosis not present

## 2018-11-22 HISTORY — PX: BASAL CELL CARCINOMA EXCISION: SHX1214

## 2018-11-25 ENCOUNTER — Other Ambulatory Visit: Payer: Self-pay | Admitting: Family Medicine

## 2018-11-30 ENCOUNTER — Ambulatory Visit (INDEPENDENT_AMBULATORY_CARE_PROVIDER_SITE_OTHER): Payer: Medicare Other

## 2018-11-30 VITALS — BP 134/78 | HR 74 | Temp 97.9°F | Ht 64.25 in | Wt 117.9 lb

## 2018-11-30 DIAGNOSIS — Z0000 Encounter for general adult medical examination without abnormal findings: Secondary | ICD-10-CM | POA: Diagnosis not present

## 2018-11-30 DIAGNOSIS — R739 Hyperglycemia, unspecified: Secondary | ICD-10-CM

## 2018-11-30 DIAGNOSIS — E7800 Pure hypercholesterolemia, unspecified: Secondary | ICD-10-CM

## 2018-11-30 LAB — CBC WITH DIFFERENTIAL/PLATELET
BASOS ABS: 0 10*3/uL (ref 0.0–0.1)
Basophils Relative: 0.6 % (ref 0.0–3.0)
Eosinophils Absolute: 0.2 10*3/uL (ref 0.0–0.7)
Eosinophils Relative: 2.4 % (ref 0.0–5.0)
HCT: 42.6 % (ref 36.0–46.0)
Hemoglobin: 14.5 g/dL (ref 12.0–15.0)
LYMPHS ABS: 1.4 10*3/uL (ref 0.7–4.0)
Lymphocytes Relative: 18.3 % (ref 12.0–46.0)
MCHC: 34 g/dL (ref 30.0–36.0)
MCV: 94.7 fl (ref 78.0–100.0)
MONO ABS: 0.8 10*3/uL (ref 0.1–1.0)
Monocytes Relative: 10.2 % (ref 3.0–12.0)
NEUTROS ABS: 5.3 10*3/uL (ref 1.4–7.7)
NEUTROS PCT: 68.5 % (ref 43.0–77.0)
PLATELETS: 233 10*3/uL (ref 150.0–400.0)
RBC: 4.5 Mil/uL (ref 3.87–5.11)
RDW: 13.6 % (ref 11.5–15.5)
WBC: 7.7 10*3/uL (ref 4.0–10.5)

## 2018-11-30 LAB — LIPID PANEL
CHOLESTEROL: 171 mg/dL (ref 0–200)
HDL: 81.6 mg/dL (ref >39.00)
LDL Cholesterol: 74 mg/dL (ref 0–99)
NonHDL: 88.93
Total CHOL/HDL Ratio: 2
Triglycerides: 74 mg/dL (ref 0.0–149.0)
VLDL: 14.8 mg/dL (ref 0.0–40.0)

## 2018-11-30 LAB — COMPREHENSIVE METABOLIC PANEL
ALK PHOS: 62 U/L (ref 39–117)
ALT: 14 U/L (ref 0–35)
AST: 24 U/L (ref 0–37)
Albumin: 4.1 g/dL (ref 3.5–5.2)
BILIRUBIN TOTAL: 0.4 mg/dL (ref 0.2–1.2)
BUN: 17 mg/dL (ref 6–23)
CO2: 29 meq/L (ref 19–32)
Calcium: 9.4 mg/dL (ref 8.4–10.5)
Chloride: 103 mEq/L (ref 96–112)
Creatinine, Ser: 0.91 mg/dL (ref 0.40–1.20)
GFR: 60.89 mL/min (ref >60.00)
GLUCOSE: 102 mg/dL — AB (ref 70–99)
Potassium: 4.3 mEq/L (ref 3.5–5.1)
SODIUM: 140 meq/L (ref 135–145)
TOTAL PROTEIN: 7 g/dL (ref 6.0–8.3)

## 2018-11-30 LAB — HEMOGLOBIN A1C: HEMOGLOBIN A1C: 6 % (ref 4.6–6.5)

## 2018-11-30 LAB — TSH: TSH: 4.47 u[IU]/mL (ref 0.35–4.50)

## 2018-11-30 NOTE — Patient Instructions (Addendum)
Cheryl Aguirre , Thank you for taking time to come for your Medicare Wellness Visit. I appreciate your ongoing commitment to your health goals. Please review the following plan we discussed and let me know if I can assist you in the future.   These are the goals we discussed: Goals    . Increase physical activity     When weather permits, I will begin walking 60 minutes 3 days per week.        This is a list of the screening recommended for you and due dates:  Health Maintenance  Topic Date Due  . Mammogram  01/11/2019  . Colon Cancer Screening  08/26/2019  . Tetanus Vaccine  07/24/2020  . Flu Shot  Completed  . DEXA scan (bone density measurement)  Completed  .  Hepatitis C: One time screening is recommended by Center for Disease Control  (CDC) for  adults born from [birth year] through 1964.   Completed  . Pneumonia vaccines  Completed   Preventive Care for Adults  A healthy lifestyle and preventive care can promote health and wellness. Preventive health guidelines for adults include the following key practices.  . A routine yearly physical is a good way to check with your health care provider about your health and preventive screening. It is a chance to share any concerns and updates on your health and to receive a thorough exam.  . Visit your dentist for a routine exam and preventive care every 6 months. Brush your teeth twice a day and floss once a day. Good oral hygiene prevents tooth decay and gum disease.  . The frequency of eye exams is based on your age, health, family medical history, use  of contact lenses, and other factors. Follow your health care provider's recommendations for frequency of eye exams.  . Eat a healthy diet. Foods like vegetables, fruits, whole grains, low-fat dairy products, and lean protein foods contain the nutrients you need without too many calories. Decrease your intake of foods high in solid fats, added sugars, and salt. Eat the right amount of calories  for you. Get information about a proper diet from your health care provider, if necessary.  . Regular physical exercise is one of the most important things you can do for your health. Most adults should get at least 150 minutes of moderate-intensity exercise (any activity that increases your heart rate and causes you to sweat) each week. In addition, most adults need muscle-strengthening exercises on 2 or more days a week.  Silver Sneakers may be a benefit available to you. To determine eligibility, you may visit the website: www.silversneakers.com or contact program at 510-565-1343 Mon-Fri between 8AM-8PM.   . Maintain a healthy weight. The body mass index (BMI) is a screening tool to identify possible weight problems. It provides an estimate of body fat based on height and weight. Your health care provider can find your BMI and can help you achieve or maintain a healthy weight.   For adults 20 years and older: ? A BMI below 18.5 is considered underweight. ? A BMI of 18.5 to 24.9 is normal. ? A BMI of 25 to 29.9 is considered overweight. ? A BMI of 30 and above is considered obese.   . Maintain normal blood lipids and cholesterol levels by exercising and minimizing your intake of saturated fat. Eat a balanced diet with plenty of fruit and vegetables. Blood tests for lipids and cholesterol should begin at age 33 and be repeated every 5 years. If  your lipid or cholesterol levels are high, you are over 50, or you are at high risk for heart disease, you may need your cholesterol levels checked more frequently. Ongoing high lipid and cholesterol levels should be treated with medicines if diet and exercise are not working.  . If you smoke, find out from your health care provider how to quit. If you do not use tobacco, please do not start.  . If you choose to drink alcohol, please do not consume more than 2 drinks per day. One drink is considered to be 12 ounces (355 mL) of beer, 5 ounces (148 mL) of  wine, or 1.5 ounces (44 mL) of liquor.  . If you are 34-70 years old, ask your health care provider if you should take aspirin to prevent strokes.  . Use sunscreen. Apply sunscreen liberally and repeatedly throughout the day. You should seek shade when your shadow is shorter than you. Protect yourself by wearing long sleeves, pants, a wide-brimmed hat, and sunglasses year round, whenever you are outdoors.  . Once a month, do a whole body skin exam, using a mirror to look at the skin on your back. Tell your health care provider of new moles, moles that have irregular borders, moles that are larger than a pencil eraser, or moles that have changed in shape or color.

## 2018-11-30 NOTE — Progress Notes (Signed)
PCP notes:   Health maintenance:  None  Abnormal screenings:   None  Patient concerns:   None  Nurse concerns:  None  Next PCP appt:   12/02/18 @ 1040  I reviewed health advisor's note, was available for consultation, and agree with documentation and plan. Loura Pardon MD

## 2018-11-30 NOTE — Progress Notes (Signed)
Subjective:   AYZIA DAY is a 73 y.o. female who presents for Medicare Annual (Subsequent) preventive examination.  Review of Systems:  N/A Cardiac Risk Factors include: advanced age (>60men, >28 women);smoking/ tobacco exposure;dyslipidemia     Objective:     Vitals: BP 134/78 (BP Location: Left Arm, Patient Position: Sitting, Cuff Size: Normal)   Pulse 74   Temp 97.9 F (36.6 C) (Oral)   Ht 5' 4.25" (1.632 m) Comment: shoes  Wt 117 lb 14.4 oz (53.5 kg)   SpO2 97%   BMI 20.08 kg/m   Body mass index is 20.08 kg/m.  Advanced Directives 11/30/2018 05/02/2018 02/24/2018 02/15/2018 01/31/2018 01/21/2018 11/26/2017  Does Patient Have a Medical Advance Directive? Yes Yes Yes Yes Yes Yes Yes  Type of Paramedic of [place];Living will [place];Living will - [place];Living will Healthcare Power of [place];Living will [place];Living will  Does patient want to make changes to medical advance directive? - - - No - Patient declined No - Patient declined - -  Copy of [place] in Chart? Yes - validated most recent copy scanned in chart (See row information) - - Yes No - copy requested - Yes    Tobacco Social History   Tobacco Use  Smoking Status Current Every Day Smoker  . Packs/day: 1.00  . Years: 56.00  . Pack years: 56.00  . Types: Cigarettes  Smokeless Tobacco Never Used     Ready to quit: No Counseling given: No   Clinical Intake:  Pre-visit preparation completed: Yes  Pain Score: 3      Nutritional Status: BMI of 19-24  Normal Nutritional Risks: None Diabetes: No  How often do you need to have someone help you when you read instructions, pamphlets, or other written materials from your doctor or pharmacy?: 1 - Never What is the last grade level you completed in school?: 12th grade + 1 yr college  Interpreter Needed?:  No  Comments: pt lives with spouse Information entered by :: LPinson, LPN  Past Medical History:  Diagnosis Date  . Anxiety   . Arthritis    left hip  . Cancer ([place]) 2019   breast  . Chronic headaches   . Colon polyps   . Depression   . Family history of breast cancer   . Family history of colon cancer   . Family history of pancreatic cancer   . Family history of thyroid cancer   . GERD (gastroesophageal reflux disease)   . Hypertension    elevated at times.  no meds  . IBS (irritable bowel syndrome)   . Insomnia   . Osteopenia   . Tobacco abuse    Past Surgical History:  Procedure Laterality Date  . ABDOMINAL HYSTERECTOMY     bleeding, fibroid, endometriosis (total)  . BASAL CELL CARCINOMA EXCISION  11/22/2018  . BASAL CELL CARCINOMA EXCISION  07/12/2018  . BREAST LUMPECTOMY WITH RADIOACTIVE SEED AND SENTINEL LYMPH NODE BIOPSY Right 01/31/2018   Procedure: BREAST LUMPECTOMY WITH RADIOACTIVE SEED AND SENTINEL LYMPH NODE BIOPSY;  Surgeon: Jovita Kussmaul, MD;  Location: [facility];  Service: General;  Laterality: Right;  . BREAST SURGERY  2019  . CATARACT EXTRACTION W/PHACO Left 02/11/2017   Procedure: CATARACT EXTRACTION PHACO AND INTRAOCULAR LENS PLACEMENT (IOC);  Surgeon: Leandrew Koyanagi, MD;  Location: [facility];  Service: Ophthalmology;  Laterality: Left;  [place] 1:02.8 AP% 13.7 CDE 8.58 FLUID PACK  LOT # [id number]  . CATARACT EXTRACTION W/PHACO Right 03/10/2017   Procedure: CATARACT EXTRACTION PHACO AND INTRAOCULAR LENS PLACEMENT (IOC) Right symfony toric lens;  Surgeon: Leandrew Koyanagi, MD;  Location: [facility];  Service: Ophthalmology;  Laterality: Right;  symfony toric lens prefers early  . CT of chest  10/09   bronchiectasis  . ENDOMETRIAL BIOPSY    . LAPAROSCOPY  1984   Family History  Problem Relation Age of Onset  . Heart disease Other   . Cancer Mother        pancreatic, liver  . Cancer Father        colon  . Colon cancer Father 55  . Thyroid  disease Sister   . Colon polyps Sister   . Irritable bowel syndrome Sister   . Breast cancer Sister 9  . Melanoma Sister 57  . Breast cancer Maternal Aunt        dx under 42  . Colon cancer Paternal Grandmother        dx in her 12s  . Thyroid cancer Paternal Grandmother        dx in her 67s  . Miscarriages / [place] Cousin   . Diabetes Cousin   . Breast cancer Cousin 62  . Thyroid disease Sister   . Skin cancer Brother   . Thyroid disease Sister   . Glaucoma Maternal Aunt    Social History   Socioeconomic History  . Marital status: Married    Spouse name: Not on file  . Number of children: 1  . Years of education: Not on file  . Highest education level: Not on file  Occupational History  . Occupation: Merchandiser, retail: UNEMPLOYED  Social Needs  . Financial resource strain: Not on file  . Food insecurity:    Worry: Not on file    Inability: Not on file  . Transportation needs:    Medical: Not on file    Non-medical: Not on file  Tobacco Use  . Smoking status: Current Every Day Smoker    Packs/day: 1.00    Years: 56.00    Pack years: 56.00    Types: Cigarettes  . Smokeless tobacco: Never Used  Substance and Sexual Activity  . Alcohol use: Yes    Comment: occ  . Drug use: No  . Sexual activity: Yes  Lifestyle  . Physical activity:    Days per week: Not on file    Minutes per session: Not on file  . Stress: Not on file  Relationships  . Social connections:    Talks on phone: Not on file    Gets together: Not on file    Attends religious service: Not on file    Active member of club or organization: Not on file    Attends meetings of clubs or organizations: Not on file    Relationship status: Not on file  Other Topics Concern  . Not on file  Social History Narrative   Daily Caffeine Use:  2 daily    Outpatient Encounter Medications as of 11/30/2018  Medication Sig  . cetirizine (ZYRTEC) 10 MG tablet Take 10 mg by mouth daily.    .  cholecalciferol (VITAMIN D) 1000 units tablet Take 1 tablet by mouth daily.   . cyclobenzaprine (FLEXERIL) 10 MG tablet Take 1 tablet (10 mg total) by mouth at bedtime. (Patient taking differently: Take 10 mg by mouth daily as needed (migraine). )  . escitalopram (LEXAPRO) 20 MG tablet TAKE 1  TABLET DAILY  . fish oil-omega-3 fatty acids 1000 MG capsule Take 1 g by mouth daily.   Marland Kitchen ibuprofen (ADVIL,MOTRIN) 200 MG tablet Take 200 mg by mouth every 4 (four) hours as needed for headache.  . Lactobacillus (ACIDOPHILUS) 100 MG CAPS Take 1 capsule by mouth daily.    . Magnesium 250 MG TABS Take 1 tablet by mouth daily as needed (constipation).   . Melatonin 5 MG TABS Take 5 mg by mouth at bedtime. As directed at bedtime.   . montelukast (SINGULAIR) 10 MG tablet Take 1 tablet (10 mg total) by mouth at bedtime.  . Multiple Vitamin (MULTIVITAMIN) tablet Take 1 tablet by mouth daily.    . SUMAtriptan (IMITREX) 50 MG tablet Take 50 mg by mouth daily as needed. May repeat in 2 hours if headache persists or recurs.   . tamoxifen (NOLVADEX) 20 MG tablet Take 1 tablet (20 mg total) by mouth daily.  . Vitamin D-Vitamin K (VITAMIN K2-VITAMIN D3 PO) Take 1 capsule by mouth daily.  . [DISCONTINUED] Calcium Citrate-Vitamin D (CITRACAL/VITAMIN D) 250-200 MG-UNIT TABS Take 4 tablets by mouth daily.   . [DISCONTINUED] Phenylephrine-Bromphen-DM (PHENYLEPHRINE COMPLEX PO) Take 1 tablet by mouth every 4 (four) hours as needed (allergies). Take 1 tablet up to every 4 hours as needed    No facility-administered encounter medications on file as of 11/30/2018.     Activities of Daily Living In your present state of health, do you have any difficulty performing the following activities: 11/30/2018 01/21/2018  Hearing? N N  Vision? N N  Difficulty concentrating or making decisions? N N  Walking or climbing stairs? N N  Dressing or bathing? N N  Doing errands, shopping? N N  Preparing Food and eating ? N -  Using the Toilet?  N -  In the past six months, have you accidently leaked urine? Y -  Do you have problems with loss of bowel control? N -  Managing your Medications? N -  Managing your Finances? N -  Housekeeping or managing your Housekeeping? N -  Some recent data might be hidden    Patient Care Team: Tower, Wynelle Fanny, MD as PCP - General Leandrew Koyanagi, MD as Referring Physician (Ophthalmology) Mauri Pole, MD as Consulting Physician (Gastroenterology) Nicholas Lose, MD as Consulting Physician (Hematology and Oncology) Kyung Rudd, MD as Consulting Physician (Radiation Oncology) Jovita Kussmaul, MD as Consulting Physician (General Surgery) Delice Bison Charlestine Massed, NP as Nurse Practitioner (Hematology and Oncology)    Assessment:   This is a routine wellness examination for Jaeanna.  Vision Screening Comments: Vision exam in March 2019 with Dr. Wallace Going   Exercise Activities and Dietary recommendations Current Exercise Habits: The patient does not participate in regular exercise at present, Exercise limited by: None identified  Goals    . Increase physical activity     When weather permits, I will begin walking 60 minutes 3 days per week.        Fall Risk Fall Risk  11/30/2018 02/15/2018 11/26/2017 11/20/2016 11/22/2015  Falls in the past year? 0 No No Yes No  Comment - - - pt tripped and slid down stairs in home -  Number falls in past yr: - - - 1 -  Injury with Fall? - - - Yes -  Comment - - - pt stated there was pain to left elbow; ice pack placed; pt did not seek medical treatment -   Depression Screen [facility] 2/9 Scores 11/30/2018 02/15/2018 11/26/2017 11/20/2016  PHQ - 2 Score 0 0 0 0  PHQ- 9 Score 0 - 0 -     Cognitive Function MMSE - Mini Mental State Exam 11/30/2018 11/26/2017 11/20/2016  Orientation to time 5 5 5   Orientation to Place 5 5 5   Registration 3 3 3   Attention/ Calculation 0 0 0  Recall 3 3 3   Language- name 2 objects 0 0 0  Language- repeat 1 1 1   Language-  follow 3 step command 3 3 3   Language- read & follow direction 0 0 0  Write a sentence 0 0 0  Copy design 0 0 0  Total score 20 20 20      PLEASE NOTE: A Mini-Cog screen was completed. Maximum score is 20. A value of 0 denotes this part of [place] MMSE was not completed or the patient failed this part of the Mini-Cog screening.   Mini-Cog Screening Orientation to Time - Max 5 pts Orientation to Place - Max 5 pts Registration - Max 3 pts Recall - Max 3 pts Language Repeat - Max 1 pts Language Follow 3 Step Command - Max 3 pts     Immunization History  Administered Date(s) Administered  . Influenza Split 07/27/2011, 07/12/2013  . Influenza Whole 08/08/2002, 07/20/2007  . Influenza, High Dose Seasonal PF 08/01/2015, 07/12/2017, 07/22/2018  . Influenza, Seasonal, Injecte, Preservative Fre 08/04/2016  . Influenza-Unspecified 07/13/2014  . Pneumococcal Conjugate-13 11/19/2014  . Pneumococcal Polysaccharide-23 05/01/2005, 11/01/2012  . Td 03/13/1999, 07/24/2010  . Zoster 08/12/2014    Screening Tests Health Maintenance  Topic Date Due  . MAMMOGRAM  01/11/2019  . COLONOSCOPY  08/26/2019  . TETANUS/TDAP  07/24/2020  . INFLUENZA VACCINE  Completed  . DEXA SCAN  Completed  . Hepatitis C Screening  Completed  . PNA vac Low Risk Adult  Completed      Plan:     I have personally reviewed, addressed, and noted the following in the patient's chart:  A. Medical and social history B. Use of alcohol, tobacco or illicit drugs  C. Current medications and supplements D. Functional ability and status E.  Nutritional status F.  Physical activity G. Advance directives H. List of other physicians I.  Hospitalizations, surgeries, and ER visits in previous 12 months J.  [place] to include hearing, vision, cognitive, depression L. Referrals and appointments - none  In addition, I have reviewed and discussed with patient certain preventive protocols, quality metrics, and  best practice recommendations. A written personalized care plan for preventive services as well as general preventive health recommendations were provided to patient.  See attached scanned questionnaire for additional information.   Signed,   Lindell Noe, MHA, BS, LPN Health Coach

## 2018-12-02 ENCOUNTER — Encounter: Payer: Self-pay | Admitting: Family Medicine

## 2018-12-02 ENCOUNTER — Ambulatory Visit (INDEPENDENT_AMBULATORY_CARE_PROVIDER_SITE_OTHER): Payer: Medicare Other | Admitting: Family Medicine

## 2018-12-02 VITALS — BP 126/68 | HR 81 | Temp 97.7°F | Ht 64.25 in | Wt 118.1 lb

## 2018-12-02 DIAGNOSIS — Z1211 Encounter for screening for malignant neoplasm of colon: Secondary | ICD-10-CM

## 2018-12-02 DIAGNOSIS — M8589 Other specified disorders of bone density and structure, multiple sites: Secondary | ICD-10-CM

## 2018-12-02 DIAGNOSIS — Z170 Estrogen receptor positive status [ER+]: Secondary | ICD-10-CM | POA: Diagnosis not present

## 2018-12-02 DIAGNOSIS — F17200 Nicotine dependence, unspecified, uncomplicated: Secondary | ICD-10-CM

## 2018-12-02 DIAGNOSIS — C50311 Malignant neoplasm of lower-inner quadrant of right female breast: Secondary | ICD-10-CM | POA: Diagnosis not present

## 2018-12-02 DIAGNOSIS — F418 Other specified anxiety disorders: Secondary | ICD-10-CM

## 2018-12-02 DIAGNOSIS — E7800 Pure hypercholesterolemia, unspecified: Secondary | ICD-10-CM

## 2018-12-02 NOTE — Assessment & Plan Note (Signed)
3 y recall will be due in nov

## 2018-12-02 NOTE — Progress Notes (Signed)
Subjective:    Patient ID: Cheryl Aguirre, female    DOB: Oct 29, 1945, 73 y.o.   MRN: [id number]  HPI Here for annual f/u of chronic health problems   In general doing ok/feeling good  Has had breast cancer and several skin cancers  Radiation made her really tired   Wt Readings from Last 3 Encounters:  12/02/18 118 lb 1 oz (53.6 kg)  11/30/18 117 lb 14.4 oz (53.5 kg)  06/27/18 118 lb 4.8 oz (53.7 kg)  is eating enough/and well  Stays active -not fully back to exercise  20.11 kg/m   Had amw on 2/19 Reviewed-no gaps or concerns   Mammogram 4/19 - US guided bx was done  Treated for breast cancer R /est rec pos 2 ln as well  Lumpectomy and radiation tx completed Now on tamoxifen  One was ordered 9.19 Sister had breast cancer (older)    Hysterectomy in the past   Colonoscopy 11/17 with 3 y recall (adenoma)  Father had colon cancer   dexa 2/18 - osteopenia in the hip  Wants to get it with her mammogram  Taking vit D and K2   zostavax 11/15  Smoking status -has not changed  She is not ready to quit exp with stress   Prediabetes Lab Results  Component Value Date   HGBA1C 6.0 11/30/2018  stable  She tries not to eat a lot of sweets  Eats more produce for her carbs occ ice cream   Cholesterol  Lab Results  Component Value Date   CHOL 171 11/30/2018   CHOL 201 (H) 11/26/2017   CHOL 218 (H) 11/20/2016   Lab Results  Component Value Date   HDL 81.60 11/30/2018   HDL 75.10 11/26/2017   HDL 83.40 11/20/2016   Lab Results  Component Value Date   LDLCALC 74 11/30/2018   LDLCALC 108 (H) 11/26/2017   LDLCALC 118 (H) 11/20/2016   Lab Results  Component Value Date   TRIG 74.0 11/30/2018   TRIG 93.0 11/26/2017   TRIG 82.0 11/20/2016   Lab Results  Component Value Date   CHOLHDL 2 11/30/2018   CHOLHDL 3 11/26/2017   CHOLHDL 3 11/20/2016   Lab Results  Component Value Date   LDLDIRECT 138.2 11/01/2013   LDLDIRECT 109.6 10/26/2012    Improved  since last visit  Eating less sweets and desserts   Lab Results  Component Value Date   WBC 7.7 11/30/2018   HGB 14.5 11/30/2018   HCT 42.6 11/30/2018   MCV 94.7 11/30/2018   PLT 233.0 11/30/2018   Lab Results  Component Value Date   CREATININE 0.91 11/30/2018   BUN 17 11/30/2018   NA 140 11/30/2018   K 4.3 11/30/2018   CL 103 11/30/2018   CO2 29 11/30/2018   Lab Results  Component Value Date   ALT 14 11/30/2018   AST 24 11/30/2018   ALKPHOS 62 11/30/2018   BILITOT 0.4 11/30/2018    Lab Results  Component Value Date   TSH 4.47 11/30/2018    Patient Active Problem List   Diagnosis Date Noted  . Skin lesion 07/19/2018  . Genetic testing 03/30/2018  . Family history of breast cancer   . Family history of thyroid cancer   . Family history of pancreatic cancer   . Malignant neoplasm of lower-inner quadrant of right breast of female, estrogen receptor positive ([place]) 02/08/2018  . Lump of breast, right 11/30/2017  . Estrogen deficiency 11/23/2016  . Prediabetes 11/14/2016  .  Screening for thyroid disorder 11/14/2016  . Family history of colon cancer 11/22/2015  . Colon cancer screening 11/22/2015  . Encounter for Medicare annual wellness exam 10/31/2013  . Headache(784.0) 11/01/2012  . Gynecologic exam normal 09/15/2011  . Routine general medical examination at a health care facility 09/07/2011  . ONYCHOMYCOSIS 07/24/2010  . PURE HYPERCHOLESTEROLEMIA 07/21/2010  . COLONIC POLYPS, HYPERPLASTIC, HX OF 02/18/2010  . Nonspecific (abnormal) findings on radiological and other examination of body structure 08/02/2008  . [place] LUNG FIELD 08/02/2008  . Osteopenia 07/20/2007  . ANXIETY 02/11/2007  . TOBACCO ABUSE 02/11/2007  . Depression with anxiety 02/11/2007  . GERD 02/11/2007  . INSOMNIA 02/11/2007  . Fibrocystic breast changes 02/11/2007   Past Medical History:  Diagnosis Date  . Anxiety   . Arthritis    left hip  . Cancer ([place])  2019   breast  . Chronic headaches   . Colon polyps   . Depression   . Family history of breast cancer   . Family history of colon cancer   . Family history of pancreatic cancer   . Family history of thyroid cancer   . GERD (gastroesophageal reflux disease)   . Hypertension    elevated at times.  no meds  . IBS (irritable bowel syndrome)   . Insomnia   . Osteopenia   . Tobacco abuse    Past Surgical History:  Procedure Laterality Date  . ABDOMINAL HYSTERECTOMY     bleeding, fibroid, endometriosis (total)  . BASAL CELL CARCINOMA EXCISION  11/22/2018  . BASAL CELL CARCINOMA EXCISION  07/12/2018  . BREAST LUMPECTOMY WITH RADIOACTIVE SEED AND SENTINEL LYMPH NODE BIOPSY Right 01/31/2018   Procedure: BREAST LUMPECTOMY WITH RADIOACTIVE SEED AND SENTINEL LYMPH NODE BIOPSY;  Surgeon: Jovita Kussmaul, MD;  Location: [facility];  Service: General;  Laterality: Right;  . BREAST SURGERY  2019  . CATARACT EXTRACTION W/PHACO Left 02/11/2017   Procedure: CATARACT EXTRACTION PHACO AND INTRAOCULAR LENS PLACEMENT (IOC);  Surgeon: Leandrew Koyanagi, MD;  Location: [facility];  Service: Ophthalmology;  Laterality: Left;  [place] 1:02.8 AP% 13.7 CDE 8.58 FLUID PACK LOT # [id number]  . CATARACT EXTRACTION W/PHACO Right 03/10/2017   Procedure: CATARACT EXTRACTION PHACO AND INTRAOCULAR LENS PLACEMENT ([place]) Right symfony toric lens;  Surgeon: Leandrew Koyanagi, MD;  Location: [facility];  Service: Ophthalmology;  Laterality: Right;  symfony toric lens prefers early  . CT of chest  10/09   bronchiectasis  . ENDOMETRIAL BIOPSY    . LAPAROSCOPY  1984   Social History   Tobacco Use  . Smoking status: Current Every Day Smoker    Packs/day: 1.00    Years: 56.00    Pack years: 56.00    Types: Cigarettes  . Smokeless tobacco: Never Used  Substance Use Topics  . Alcohol use: Yes    Comment: occ  . Drug use: No   Family History  Problem Relation Age of Onset  . Heart disease Other   . Cancer Mother          pancreatic, liver  . Cancer Father        colon  . Colon cancer Father 47  . Thyroid disease Sister   . Colon polyps Sister   . Irritable bowel syndrome Sister   . Breast cancer Sister 22  . Melanoma Sister 88  . Breast cancer Maternal Aunt        dx under 75  . Colon cancer Paternal Grandmother  dx in her 31s  . Thyroid cancer Paternal Grandmother        dx in her 40s  . Miscarriages / [place] Cousin   . Diabetes Cousin   . Breast cancer Cousin 60  . Thyroid disease Sister   . Skin cancer Brother   . Thyroid disease Sister   . Glaucoma Maternal Aunt    Allergies  Allergen Reactions  . Adhesive [Tape]     Tears skin  . Alendronate Sodium     REACTION: muscle, joint pain, indigestion  . Penicillins Other (See Comments)    Has patient had a PCN reaction causing immediate rash, facial/tongue/throat swelling, SOB or lightheadedness with hypotension: yes Has patient had a PCN reaction causing severe rash involving mucus membranes or skin necrosis: no Has patient had a PCN reaction that required hospitalization: no Has patient had a PCN reaction occurring within the last 10 years: no If all of the above answers are "NO", then may proceed with Cephalosporin use. **CHILDHOOD**  . Varenicline Tartrate Nausea Only       . Doxycycline Nausea And Vomiting and Rash        Current Outpatient Medications on File Prior to Visit  Medication Sig Dispense Refill  . acetaminophen (TYLENOL) 500 MG tablet Take 500 mg by mouth every 8 (eight) hours as needed.    . cetirizine (ZYRTEC) 10 MG tablet Take 10 mg by mouth daily.      . cholecalciferol (VITAMIN D) 1000 units tablet Take 1 tablet by mouth daily.     . cyclobenzaprine (FLEXERIL) 10 MG tablet Take 1 tablet (10 mg total) by mouth at bedtime. (Patient taking differently: Take 10 mg by mouth daily as needed (migraine). ) 5 tablet 0  . escitalopram (LEXAPRO) 20 MG tablet TAKE 1 TABLET DAILY 90 tablet 1  . fish oil-omega-3  fatty acids 1000 MG capsule Take 1 g by mouth daily.     . Lactobacillus (ACIDOPHILUS) 100 MG CAPS Take 1 capsule by mouth daily.      . Magnesium 250 MG TABS Take 1 tablet by mouth daily as needed (constipation).     . Melatonin 5 MG TABS Take 5 mg by mouth at bedtime. As directed at bedtime.     . montelukast (SINGULAIR) 10 MG tablet Take 1 tablet (10 mg total) by mouth at bedtime. 90 tablet 3  . Multiple Vitamin (MULTIVITAMIN) tablet Take 1 tablet by mouth daily.      . SUMAtriptan (IMITREX) 50 MG tablet Take 50 mg by mouth daily as needed. May repeat in 2 hours if headache persists or recurs.     . tamoxifen (NOLVADEX) 20 MG tablet Take 1 tablet (20 mg total) by mouth daily. 90 tablet 3  . Vitamin D-Vitamin K (VITAMIN K2-VITAMIN D3 PO) Take 1 capsule by mouth daily.     No current facility-administered medications on file prior to visit.      Review of Systems  Constitutional: Positive for fatigue. Negative for activity change, appetite change, fever and unexpected weight change.  HENT: Negative for congestion, ear pain, rhinorrhea, sinus pressure and sore throat.   Eyes: Negative for pain, redness and visual disturbance.  Respiratory: Negative for cough, shortness of breath and wheezing.   Cardiovascular: Negative for chest pain and palpitations.  Gastrointestinal: Negative for abdominal pain, blood in stool, constipation and diarrhea.  Endocrine: Negative for polydipsia and polyuria.  Genitourinary: Negative for dysuria, frequency and urgency.  Musculoskeletal: Negative for arthralgias, back pain and myalgias.  Skin: Negative for pallor and rash.       Dry skin esp since tamoxifen  Allergic/Immunologic: Negative for environmental allergies.  Neurological: Negative for dizziness, syncope and headaches.  Hematological: Negative for adenopathy. Does not bruise/bleed easily.  Psychiatric/Behavioral: Negative for decreased concentration and dysphoric mood. The patient is not  nervous/anxious.        Stressors        Objective:   Physical Exam Constitutional:      General: She is not in acute distress.    Appearance: Normal appearance. She is well-developed and normal weight. She is not ill-appearing.  HENT:     Head: Normocephalic and atraumatic.     Right Ear: Tympanic membrane, ear canal and external ear normal.     Left Ear: Tympanic membrane, ear canal and external ear normal.     Nose: Nose normal.     Mouth/Throat:     Mouth: Mucous membranes are moist.     Pharynx: Oropharynx is clear.  Eyes:     General: No scleral icterus.    Conjunctiva/sclera: Conjunctivae normal.     Pupils: Pupils are equal, round, and reactive to light.  Neck:     Musculoskeletal: Normal range of motion and neck supple. No muscular tenderness.     Thyroid: No thyromegaly.     Vascular: No carotid bruit or JVD.  Cardiovascular:     Rate and Rhythm: Normal rate and regular rhythm.     Pulses: Normal pulses.     Heart sounds: Normal heart sounds. No gallop.   Pulmonary:     Effort: Pulmonary effort is normal. No respiratory distress.     Breath sounds: Normal breath sounds. No wheezing.     Comments: Slightly distant bs Chest:     Chest wall: No tenderness.  Abdominal:     General: Bowel sounds are normal. There is no distension or abdominal bruit.     Palpations: Abdomen is soft. There is no mass.     Tenderness: There is no abdominal tenderness.  Genitourinary:    Comments: Surgical changes to R breast (lumpectomy) and axilla  Breast exam: No mass, nodules, thickening, tenderness, bulging, retraction, inflamation, nipple discharge or skin changes noted.  No axillary or clavicular LA.     Musculoskeletal: Normal range of motion.        General: No tenderness.     Right lower leg: No edema.     Left lower leg: No edema.  Lymphadenopathy:     Cervical: No cervical adenopathy.  Skin:    General: Skin is warm and dry.     Capillary Refill: Capillary refill takes  less than 2 seconds.     Coloration: Skin is not pale.     Findings: No erythema or rash.     Comments: Some dryness Varicosities and spider veins on ankles/feet  Dressing over L eye -recent derm sx  Also well healed scar under L eye from past procedure   Solar lentigines diffusely   Neurological:     General: No focal deficit present.     Mental Status: She is alert.     Cranial Nerves: No cranial nerve deficit.     Motor: No abnormal muscle tone.     Coordination: Coordination normal.     Deep Tendon Reflexes: Reflexes are normal and symmetric. Reflexes normal.  Psychiatric:        Mood and Affect: Mood normal.           Assessment &  Plan:   Problem List Items Addressed This Visit      Musculoskeletal and Integument   Osteopenia - Primary    Due for dexa 2 y f/u (on tamoxifen)  She plans to do it at the same time as her mammogram in a few mo and will call for ref if needed Also watched by oncology No falls or fx Takes D and also K2        Other   PURE HYPERCHOLESTEROLEMIA    Disc goals for lipids and reasons to control them Rev last labs with pt Rev low sat fat diet in detail Good report with inc HDL and lower LDL  Commended       TOBACCO ABUSE    Disc in detail risks of smoking and possible outcomes including copd, vascular/ heart disease, cancer , respiratory and sinus infections  Pt voices understanding She is not ready to quit at this time       Depression with anxiety    After a tough personal year (breast and skin cancer) she still keeps a positive attitude Reviewed stressors/ coping techniques/symptoms/ support sources/ tx options and side effects in detail today Will continue lexapro      Colon cancer screening    3 y recall will be due in nov       Malignant neoplasm of lower-inner quadrant of right breast of female, estrogen receptor positive ([place])    Pt is doing well s/p lumpectomy/LN removal and radiation Good attitude as  well Tolerating tamoxifen (discussed side effects)  Continue oncol f/u  Will be due for mammogram in the spring and will do dexa at that time Had re assuring genetic testing

## 2018-12-02 NOTE — Assessment & Plan Note (Signed)
After a tough personal year (breast and skin cancer) she still keeps a positive attitude Reviewed stressors/ coping techniques/symptoms/ support sources/ tx options and side effects in detail today Will continue lexapro

## 2018-12-02 NOTE — Patient Instructions (Addendum)
When you have your mammogram date please alert me so I can order the bone density test (unless it is already ordered)   Take care of yourself  Stay active  No change in medications   When ready - think about quitting smoking   Continue oncology follow up appointments

## 2018-12-02 NOTE — Assessment & Plan Note (Signed)
Pt is doing well s/p lumpectomy/LN removal and radiation Good attitude as well Tolerating tamoxifen (discussed side effects)  Continue oncol f/u  Will be due for mammogram in the spring and will do dexa at that time Had re assuring genetic testing

## 2018-12-02 NOTE — Assessment & Plan Note (Signed)
More headaches since her breast cancer tx with radiation / also tamoxifen Magnesium otc helps - w/o side eff

## 2018-12-02 NOTE — Assessment & Plan Note (Signed)
Disc goals for lipids and reasons to control them Rev last labs with pt Rev low sat fat diet in detail Good report with inc HDL and lower LDL  Commended

## 2018-12-02 NOTE — Assessment & Plan Note (Signed)
Due for dexa 2 y f/u (on tamoxifen)  She plans to do it at the same time as her mammogram in a few mo and will call for ref if needed Also watched by oncology No falls or fx Takes D and also K2

## 2018-12-02 NOTE — Assessment & Plan Note (Signed)
Disc in detail risks of smoking and possible outcomes including copd, vascular/ heart disease, cancer , respiratory and sinus infections  Pt voices understanding She is not ready to quit at this time

## 2018-12-06 ENCOUNTER — Other Ambulatory Visit: Payer: Self-pay | Admitting: Family Medicine

## 2018-12-25 NOTE — Assessment & Plan Note (Signed)
01/31/2018:Right lumpectomy: IDC with DCIS 0.4 cm, margins negative, 0/2 lymph nodes negative, grade 2, ER 100%, PR 100%, HER-2 negative ratio 1.19, T1 a N0 stage I a  Adjuvant radiation therapy 03/03/2018-03/29/2018 Treatment plan: Tamoxifen 20 mg daily to start 04/11/2018 (because the patient has severe osteoporosis we are not using aromatase inhibitor therapy)  Tamoxifen Toxicities:  Breast Cancer Surveillance: 1. Breast Exam: 12/26/2018 2. Mammogram: to be done in April 2020  RTC in 1 year for follow up

## 2018-12-26 ENCOUNTER — Other Ambulatory Visit: Payer: Self-pay

## 2018-12-26 ENCOUNTER — Inpatient Hospital Stay: Payer: Medicare Other | Attending: Hematology and Oncology | Admitting: Hematology and Oncology

## 2018-12-26 VITALS — BP 126/67 | HR 87 | Temp 98.5°F | Resp 17 | Ht 64.25 in | Wt 115.9 lb

## 2018-12-26 DIAGNOSIS — M810 Age-related osteoporosis without current pathological fracture: Secondary | ICD-10-CM | POA: Insufficient documentation

## 2018-12-26 DIAGNOSIS — C50311 Malignant neoplasm of lower-inner quadrant of right female breast: Secondary | ICD-10-CM

## 2018-12-26 DIAGNOSIS — Z79810 Long term (current) use of selective estrogen receptor modulators (SERMs): Secondary | ICD-10-CM | POA: Insufficient documentation

## 2018-12-26 DIAGNOSIS — Z170 Estrogen receptor positive status [ER+]: Secondary | ICD-10-CM | POA: Insufficient documentation

## 2018-12-26 DIAGNOSIS — Z780 Asymptomatic menopausal state: Secondary | ICD-10-CM

## 2018-12-26 MED ORDER — TAMOXIFEN CITRATE 20 MG PO TABS: 20.0000 mg | ORAL_TABLET | Freq: Every day | ORAL | 3 refills | Status: DC

## 2018-12-26 NOTE — Progress Notes (Signed)
Patient Care Team: Tower, Wynelle Fanny, MD as PCP - General Leandrew Koyanagi, MD as Referring Physician (Ophthalmology) Mauri Pole, MD as Consulting Physician (Gastroenterology) Nicholas Lose, MD as Consulting Physician (Hematology and Oncology) Kyung Rudd, MD as Consulting Physician (Radiation Oncology) Jovita Kussmaul, MD as Consulting Physician (General Surgery) Delice Bison Charlestine Massed, NP as Nurse Practitioner (Hematology and Oncology)  DIAGNOSIS:  Encounter Diagnosis  Name Primary?  . Malignant neoplasm of lower-inner quadrant of right breast of female, estrogen receptor positive ([place])     SUMMARY OF ONCOLOGIC HISTORY:   Malignant neoplasm of lower-inner quadrant of right breast of female, estrogen receptor positive ([place])   01/31/2018 Initial Diagnosis    Right lumpectomy: IDC with DCIS 0.4 cm, margins negative, 0/2 lymph nodes negative, grade 2, ER 100%, PR 100%, HER-2 negative ratio 1.19, T1 a N0 stage I a    02/16/2018 Cancer Staging    Staging form: Breast, AJCC 8th Edition - Pathologic: Stage IA (pT1a, pN0, cM0, G2, ER+, PR+, HER2-) - Signed by Gardenia Phlegm, NP on 02/16/2018    03/03/2018 - 03/29/2018 Radiation Therapy    Adjuvant radiation therapy    03/29/2018 Genetic Testing    Negative genetic testing on the multi-cancer panel.  The Multi-Gene Panel offered by [facility] includes sequencing and/or deletion duplication testing of the following 83 genes: ALK, APC, ATM, AXIN2,BAP1,  BARD1, BLM, BMPR1A, BRCA1, BRCA2, BRIP1, CASR, CDC73, CDH1, CDK4, CDKN1B, CDKN1C, CDKN2A (p14ARF), CDKN2A (p16INK4a), CEBPA, CHEK2, CTNNA1, DICER1, DIS3L2, EGFR (c.2369C>T, p.Thr790Met variant only), EPCAM (Deletion/duplication testing only), FH, FLCN, GATA2, GPC3, GREM1 (Promoter region deletion/duplication testing only), HOXB13 (c.251G>A, p.Gly84Glu), HRAS, KIT, MAX, MEN1, MET, MITF (c.952G>A, p.Glu318Lys variant only), MLH1, MSH2, MSH3, MSH6, MUTYH, NBN, NF1, NF2, NTHL1, PALB2,  PDGFRA, PHOX2B, PMS2, POLD1, POLE, POT1, PRKAR1A, PTCH1, PTEN, RAD50, RAD51C, RAD51D, RB1, RECQL4, RET, RUNX1, SDHAF2, SDHA (sequence changes only), SDHB, SDHC, SDHD, SMAD4, SMARCA4, SMARCB1, SMARCE1, STK11, SUFU, TERT, TERT, TMEM127, TP53, TSC1, TSC2, VHL, WRN and WT1.  The report date is March 29, 2018.    03/2018 -  Anti-estrogen oral therapy    Tamoxifen daily     CHIEF COMPLIANT: Follow-up on tamoxifen therapy  INTERVAL HISTORY: Cheryl Aguirre is a 73 year old with above-mentioned history of right breast DCIS and invasive ductal carcinoma who is currently on tamoxifen therapy and appears to be tolerating it extremely well.  She denies any lumps or nodules in the breast.  Mammograms will need to be scheduled.  She also needs a bone density scheduled.  REVIEW OF SYSTEMS:   Constitutional: Denies fevers, chills or abnormal weight loss Eyes: Denies blurriness of vision Ears, nose, mouth, throat, and face: Denies mucositis or sore throat Respiratory: Denies cough, dyspnea or wheezes Cardiovascular: Denies palpitation, chest discomfort Gastrointestinal:  Denies nausea, heartburn or change in bowel habits Skin: Denies abnormal skin rashes Lymphatics: Denies new lymphadenopathy or easy bruising Neurological:Denies numbness, tingling or new weaknesses Behavioral/Psych: Mood is stable, no new changes  Extremities: No lower extremity edema Breast:  denies any pain or lumps or nodules in either breasts All other systems were reviewed with the patient and are negative.  I have reviewed the past medical history, past surgical history, social history and family history with the patient and they are unchanged from previous note.  ALLERGIES:  is allergic to adhesive [tape]; alendronate sodium; penicillins; varenicline tartrate; and doxycycline.  MEDICATIONS:  Current Outpatient Medications  Medication Sig Dispense Refill  . acetaminophen (TYLENOL) 500 MG tablet Take 500 mg by mouth  every 8  (eight) hours as needed.    . cetirizine (ZYRTEC) 10 MG tablet Take 10 mg by mouth daily.      . cholecalciferol (VITAMIN D) 1000 units tablet Take 1 tablet by mouth daily.     . cyclobenzaprine (FLEXERIL) 10 MG tablet Take 1 tablet (10 mg total) by mouth at bedtime. (Patient taking differently: Take 10 mg by mouth daily as needed (migraine). ) 5 tablet 0  . escitalopram (LEXAPRO) 20 MG tablet TAKE 1 TABLET DAILY 90 tablet 1  . fish oil-omega-3 fatty acids 1000 MG capsule Take 1 g by mouth daily.     . Lactobacillus (ACIDOPHILUS) 100 MG CAPS Take 1 capsule by mouth daily.      . Magnesium 250 MG TABS Take 1 tablet by mouth daily as needed (constipation).     . Melatonin 5 MG TABS Take 5 mg by mouth at bedtime. As directed at bedtime.     . montelukast (SINGULAIR) 10 MG tablet TAKE 1 TABLET AT BEDTIME 90 tablet 1  . Multiple Vitamin (MULTIVITAMIN) tablet Take 1 tablet by mouth daily.      . SUMAtriptan (IMITREX) 50 MG tablet Take 50 mg by mouth daily as needed. May repeat in 2 hours if headache persists or recurs.     . tamoxifen (NOLVADEX) 20 MG tablet Take 1 tablet (20 mg total) by mouth daily. 90 tablet 3  . Vitamin D-Vitamin K (VITAMIN K2-VITAMIN D3 PO) Take 1 capsule by mouth daily.     No current facility-administered medications for this visit.     PHYSICAL EXAMINATION: ECOG PERFORMANCE STATUS: 0 - Asymptomatic  Vitals:   12/26/18 1503  BP: 126/67  Pulse: 87  Resp: 17  Temp: 98.5 F (36.9 C)  SpO2: 100%   Filed Weights   12/26/18 1503  Weight: 115 lb 14.4 oz (52.6 kg)    GENERAL:alert, no distress and comfortable SKIN: skin color, texture, turgor are normal, no rashes or significant lesions EYES: normal, Conjunctiva are pink and non-injected, sclera clear OROPHARYNX:no exudate, no erythema and lips, buccal mucosa, and tongue normal  NECK: supple, thyroid normal size, non-tender, without nodularity LYMPH:  no palpable lymphadenopathy in the cervical, axillary or inguinal  LUNGS: clear to auscultation and percussion with normal breathing effort HEART: regular rate & rhythm and no murmurs and no lower extremity edema ABDOMEN:abdomen soft, non-tender and normal bowel sounds MUSCULOSKELETAL:no cyanosis of digits and no clubbing  NEURO: alert & oriented x 3 with fluent speech, no focal motor/sensory deficits EXTREMITIES: No lower extremity edema BREAST: No palpable masses or nodules in either right or left breasts. No palpable axillary supraclavicular or infraclavicular adenopathy no breast tenderness or nipple discharge. (exam performed in the presence of a chaperone)  LABORATORY DATA:  I have reviewed the data as listed CMP Latest Ref Rng & Units 11/30/2018 01/21/2018 11/26/2017  Glucose 70 - 99 mg/dL 102(H) 102(H) 104(H)  BUN 6 - 23 mg/dL 17 14 15   Creatinine 0.40 - 1.20 mg/dL 0.91 0.89 0.83  Sodium 135 - 145 mEq/L 140 138 138  Potassium 3.5 - 5.1 mEq/L 4.3 3.9 4.2  Chloride 96 - 112 mEq/L 103 102 101  CO2 19 - 32 mEq/L 29 25 32  Calcium 8.4 - 10.5 mg/dL 9.4 9.7 9.5  Total Protein 6.0 - 8.3 g/dL 7.0 6.9 7.0  Total Bilirubin 0.2 - 1.2 mg/dL 0.4 0.6 0.6  Alkaline Phos 39 - 117 U/L 62 82 82  AST 0 - 37 U/L 24 28 22  ALT 0 - 35 U/L 14 18 12     Lab Results  Component Value Date   WBC 7.7 11/30/2018   HGB 14.5 11/30/2018   HCT 42.6 11/30/2018   MCV 94.7 11/30/2018   PLT 233.0 11/30/2018   NEUTROABS 5.3 11/30/2018    ASSESSMENT & PLAN:  Malignant neoplasm of lower-inner quadrant of right breast of female, estrogen receptor positive ([place]) 01/31/2018:Right lumpectomy: IDC with DCIS 0.4 cm, margins negative, 0/2 lymph nodes negative, grade 2, ER 100%, PR 100%, HER-2 negative ratio 1.19, T1 a N0 stage I a  Adjuvant radiation therapy 03/03/2018-03/29/2018 Treatment plan: Tamoxifen 20 mg daily to start 04/11/2018 (because the patient has severe osteoporosis we are not using aromatase inhibitor therapy)  Tamoxifen Toxicities: Denies any toxicities. Patient is  generally hot natured and does not notice any difference.  Breast Cancer Surveillance: 1. Breast Exam: 12/26/2018 2. Mammogram: to be done in April 2020  RTC in 1 year for follow up    No orders of the defined types were placed in this encounter.  The patient has a good understanding of the overall plan. she agrees with it. she will call with any problems that may develop before the next visit here.   Harriette Ohara, MD 12/26/18

## 2019-02-17 ENCOUNTER — Other Ambulatory Visit: Payer: Medicare Other

## 2019-05-24 ENCOUNTER — Other Ambulatory Visit: Payer: Self-pay | Admitting: Family Medicine

## 2019-06-04 ENCOUNTER — Other Ambulatory Visit: Payer: Self-pay | Admitting: Family Medicine

## 2019-07-20 ENCOUNTER — Other Ambulatory Visit: Payer: Self-pay

## 2019-07-20 ENCOUNTER — Ambulatory Visit
Admission: RE | Admit: 2019-07-20 | Discharge: 2019-07-20 | Disposition: A | Discharge status: Home / Self Care | Payer: Medicare Other | Source: Ambulatory Visit | Attending: Hematology and Oncology | Admitting: Hematology and Oncology

## 2019-07-20 DIAGNOSIS — C50311 Malignant neoplasm of lower-inner quadrant of right female breast: Secondary | ICD-10-CM

## 2019-07-20 DIAGNOSIS — R922 Inconclusive mammogram: Secondary | ICD-10-CM | POA: Diagnosis not present

## 2019-07-20 DIAGNOSIS — Z170 Estrogen receptor positive status [ER+]: Secondary | ICD-10-CM

## 2019-07-20 HISTORY — DX: Personal history of irradiation: Z92.3

## 2019-08-07 ENCOUNTER — Encounter: Payer: Self-pay | Admitting: Gastroenterology

## 2019-10-10 ENCOUNTER — Ambulatory Visit
Admission: RE | Admit: 2019-10-10 | Discharge: 2019-10-10 | Disposition: A | Discharge status: Home / Self Care | Payer: Medicare Other | Source: Ambulatory Visit | Attending: Hematology and Oncology | Admitting: Hematology and Oncology

## 2019-10-10 ENCOUNTER — Other Ambulatory Visit: Payer: Self-pay

## 2019-10-10 DIAGNOSIS — Z780 Asymptomatic menopausal state: Secondary | ICD-10-CM

## 2019-11-20 ENCOUNTER — Other Ambulatory Visit: Payer: Self-pay | Admitting: Family Medicine

## 2019-12-11 ENCOUNTER — Ambulatory Visit (INDEPENDENT_AMBULATORY_CARE_PROVIDER_SITE_OTHER): Payer: Medicare Other

## 2019-12-11 ENCOUNTER — Other Ambulatory Visit: Payer: Self-pay

## 2019-12-11 DIAGNOSIS — Z0000 Encounter for general adult medical examination without abnormal findings: Secondary | ICD-10-CM

## 2019-12-11 DIAGNOSIS — F17210 Nicotine dependence, cigarettes, uncomplicated: Secondary | ICD-10-CM

## 2019-12-11 NOTE — Patient Instructions (Signed)
Cheryl Aguirre , Thank you for taking time to come for your Medicare Wellness Visit. I appreciate your ongoing commitment to your health goals. Please review the following plan we discussed and let me know if I can assist you in the future.   Screening recommendations/referrals: Colonoscopy: declined Mammogram: Up to date, completed 07/20/2019 Bone Density: Up to date, completed 10/10/2019 Recommended yearly ophthalmology/optometry visit for glaucoma screening and checkup Recommended yearly dental visit for hygiene and checkup  Vaccinations: Influenza vaccine: Up to date, completed 07/17/2019 Pneumococcal vaccine: Completed series Tdap vaccine: Up to date, completed 07/24/2010 Shingles vaccine: discussed    Advanced directives: Please bring a copy of your POA (Power of [place]) and/or Living Will to your next appointment.   Conditions/risks identified: hypercholesterolemia  Next appointment: 12/15/2019 @ 3:15 pm    Preventive Care 65 Years and Older, Female Preventive care refers to lifestyle choices and visits with your health care provider that can promote health and wellness. What does preventive care include?  A yearly physical exam. This is also called an annual well check.  Dental exams once or twice a year.  Routine eye exams. Ask your health care provider how often you should have your eyes checked.  Personal lifestyle choices, including:  Daily care of your teeth and gums.  Regular physical activity.  Eating a healthy diet.  Avoiding tobacco and drug use.  Limiting alcohol use.  Practicing safe sex.  Taking low-dose aspirin every day.  Taking vitamin and mineral supplements as recommended by your health care provider. What happens during an annual well check? The services and screenings done by your health care provider during your annual well check will depend on your age, overall health, lifestyle risk factors, and family history of disease. Counseling  Your  health care provider may ask you questions about your:  Alcohol use.  Tobacco use.  Drug use.  Emotional well-being.  Home and relationship well-being.  Sexual activity.  Eating habits.  History of falls.  Memory and ability to understand (cognition).  Work and work Statistician.  Reproductive health. Screening  You may have the following tests or measurements:  Height, weight, and BMI.  Blood pressure.  Lipid and cholesterol levels. These may be checked every 5 years, or more frequently if you are over 62 years old.  Skin check.  Lung cancer screening. You may have this screening every year starting at age 22 if you have a 30-pack-year history of smoking and currently smoke or have quit within the past 15 years.  Fecal occult blood test (FOBT) of the stool. You may have this test every year starting at age 8.  Flexible sigmoidoscopy or colonoscopy. You may have a sigmoidoscopy every 5 years or a colonoscopy every 10 years starting at age 21.  Hepatitis C blood test.  Hepatitis B blood test.  Sexually transmitted disease (STD) testing.  Diabetes screening. This is done by checking your blood sugar (glucose) after you have not eaten for a while (fasting). You may have this done every 1-3 years.  Bone density scan. This is done to screen for osteoporosis. You may have this done starting at age 26.  Mammogram. This may be done every 1-2 years. Talk to your health care provider about how often you should have regular mammograms. Talk with your health care provider about your test results, treatment options, and if necessary, the need for more tests. Vaccines  Your health care provider may recommend certain vaccines, such as:  Influenza vaccine. This is  recommended every year.  Tetanus, diphtheria, and acellular pertussis (Tdap, Td) vaccine. You may need a Td booster every 10 years.  Zoster vaccine. You may need this after age 55.  Pneumococcal 13-valent  conjugate (PCV13) vaccine. One dose is recommended after age 58.  Pneumococcal polysaccharide (PPSV23) vaccine. One dose is recommended after age 59. Talk to your health care provider about which screenings and vaccines you need and how often you need them. This information is not intended to replace advice given to you by your health care provider. Make sure you discuss any questions you have with your health care provider. Document Released: 10/25/2015 Document Revised: 06/17/2016 Document Reviewed: 07/30/2015 Elsevier Interactive Patient Education  2017 [place] Prevention in the Home Falls can cause injuries. They can happen to people of all ages. There are many things you can do to make your home safe and to help prevent falls. What can I do on the outside of my home?  Regularly fix the edges of walkways and driveways and fix any cracks.  Remove anything that might make you trip as you walk through a door, such as a raised step or threshold.  Trim any bushes or trees on the path to your home.  Use bright outdoor lighting.  Clear any walking paths of anything that might make someone trip, such as rocks or tools.  Regularly check to see if handrails are loose or broken. Make sure that both sides of any steps have handrails.  Any raised decks and porches should have guardrails on the edges.  Have any leaves, snow, or ice cleared regularly.  Use sand or salt on walking paths during winter.  Clean up any spills in your garage right away. This includes oil or grease spills. What can I do in the bathroom?  Use night lights.  Install grab bars by the toilet and in the tub and shower. Do not use towel bars as grab bars.  Use non-skid mats or decals in the tub or shower.  If you need to sit down in the shower, use a plastic, non-slip stool.  Keep the floor dry. Clean up any water that spills on the floor as soon as it happens.  Remove soap buildup in the tub or  shower regularly.  Attach bath mats securely with double-sided non-slip rug tape.  Do not have throw rugs and other things on the floor that can make you trip. What can I do in the bedroom?  Use night lights.  Make sure that you have a light by your bed that is easy to reach.  Do not use any sheets or blankets that are too big for your bed. They should not hang down onto the floor.  Have a firm chair that has side arms. You can use this for support while you get dressed.  Do not have throw rugs and other things on the floor that can make you trip. What can I do in the kitchen?  Clean up any spills right away.  Avoid walking on wet floors.  Keep items that you use a lot in easy-to-reach places.  If you need to reach something above you, use a strong step stool that has a grab bar.  Keep electrical cords out of the way.  Do not use floor polish or wax that makes floors slippery. If you must use wax, use non-skid floor wax.  Do not have throw rugs and other things on the floor that can make you trip.  What can I do with my stairs?  Do not leave any items on the stairs.  Make sure that there are handrails on both sides of the stairs and use them. Fix handrails that are broken or loose. Make sure that handrails are as long as the stairways.  Check any carpeting to make sure that it is firmly attached to the stairs. Fix any carpet that is loose or worn.  Avoid having throw rugs at the top or bottom of the stairs. If you do have throw rugs, attach them to the floor with carpet tape.  Make sure that you have a light switch at the top of the stairs and the bottom of the stairs. If you do not have them, ask someone to add them for you. What else can I do to help prevent falls?  Wear shoes that:  Do not have high heels.  Have rubber bottoms.  Are comfortable and fit you well.  Are closed at the toe. Do not wear sandals.  If you use a stepladder:  Make sure that it is fully  opened. Do not climb a closed stepladder.  Make sure that both sides of the stepladder are locked into place.  Ask someone to hold it for you, if possible.  Clearly mark and make sure that you can see:  Any grab bars or handrails.  First and last steps.  Where the edge of each step is.  Use tools that help you move around (mobility aids) if they are needed. These include:  Canes.  Walkers.  Scooters.  Crutches.  Turn on the lights when you go into a dark area. Replace any light bulbs as soon as they burn out.  Set up your furniture so you have a clear path. Avoid moving your furniture around.  If any of your floors are uneven, fix them.  If there are any pets around you, be aware of where they are.  Review your medicines with your doctor. Some medicines can make you feel dizzy. This can increase your chance of falling. Ask your doctor what other things that you can do to help prevent falls. This information is not intended to replace advice given to you by your health care provider. Make sure you discuss any questions you have with your health care provider. Document Released: 07/25/2009 Document Revised: 03/05/2016 Document Reviewed: 11/02/2014 Elsevier Interactive Patient Education  2017 [employer].

## 2019-12-11 NOTE — Progress Notes (Signed)
PCP notes:  Health Maintenance: Colonoscopy- declined due to COVID CT Chest- referral/order placed (Patient agreed to have this done)   Abnormal Screenings: none   Patient concerns: none   Nurse concerns: none   Next PCP appt.: 12/15/2019 @ 3:15 pm

## 2019-12-11 NOTE — Progress Notes (Signed)
Subjective:   Cheryl Aguirre is a 74 y.o. female who presents for Medicare Annual (Subsequent) preventive examination.  Review of Systems: N/A   This visit is being conducted through telemedicine via telephone at the nurse health advisor's home address due to the COVID-19 pandemic. This patient has given me verbal consent via doximity to conduct this visit, patient states they are participating from their home address. Patient and myself are on the telephone call. There is no referral for this visit. Some vital signs may be absent or patient reported.    Patient identification: identified by name, DOB, and current address   Cardiac Risk Factors include: advanced age (>83men, >7 women);Other (see comment);smoking/ tobacco exposure, Risk factor comments: hypercholesterolemia     Objective:     Vitals: There were no vitals taken for this visit.  There is no height or weight on file to calculate BMI.  Advanced Directives 12/11/2019 11/30/2018 05/02/2018 02/24/2018 02/15/2018 01/31/2018 01/21/2018  Does Patient Have a Medical Advance Directive? Yes Yes Yes Yes Yes Yes Yes  Type of Paramedic of [place];Living will [place];Living will [place];Living will - [place];Living will Healthcare Power of [place];Living will  Does patient want to make changes to medical advance directive? - - - - No - Patient declined No - Patient declined -  Copy of [place] in Chart? No - copy requested Yes - validated most recent copy scanned in chart (See row information) - - Yes No - copy requested -    Tobacco Social History   Tobacco Use  Smoking Status Current Every Day Smoker  . Packs/day: 1.00  . Years: 56.00  . Pack years: 56.00  . Types: Cigarettes  Smokeless Tobacco Never Used     Ready to quit: Not Answered Counseling given: Not Answered   Clinical  Intake:  Pre-visit preparation completed: Yes  Pain : No/denies pain     Nutritional Risks: None Diabetes: No  How often do you need to have someone help you when you read instructions, pamphlets, or other written materials from your doctor or pharmacy?: 1 - Never What is the last grade level you completed in school?: some college  Interpreter Needed?: No  Information entered by :: CJohnson, LPN  Past Medical History:  Diagnosis Date  . Anxiety   . Arthritis    left hip  . Cancer ([place]) 2019   breast  . Chronic headaches   . Colon polyps   . Depression   . Family history of breast cancer   . Family history of colon cancer   . Family history of pancreatic cancer   . Family history of thyroid cancer   . GERD (gastroesophageal reflux disease)   . Hypertension    elevated at times.  no meds  . IBS (irritable bowel syndrome)   . Insomnia   . Osteopenia   . Personal history of radiation therapy    2019  . Tobacco abuse    Past Surgical History:  Procedure Laterality Date  . ABDOMINAL HYSTERECTOMY     bleeding, fibroid, endometriosis (total)  . BASAL CELL CARCINOMA EXCISION  11/22/2018  . BASAL CELL CARCINOMA EXCISION  07/12/2018  . BREAST LUMPECTOMY Right 01/2018  . BREAST LUMPECTOMY WITH RADIOACTIVE SEED AND SENTINEL LYMPH NODE BIOPSY Right 01/31/2018   Procedure: BREAST LUMPECTOMY WITH RADIOACTIVE SEED AND SENTINEL LYMPH NODE BIOPSY;  Surgeon: Jovita Kussmaul, MD;  Location:  [facility];  Service: General;  Laterality: Right;  . BREAST SURGERY  2019  . CATARACT EXTRACTION W/PHACO Left 02/11/2017   Procedure: CATARACT EXTRACTION PHACO AND INTRAOCULAR LENS PLACEMENT (IOC);  Surgeon: Leandrew Koyanagi, MD;  Location: [facility] ORS;  Service: Ophthalmology;  Laterality: Left;  [place] 1:02.8 AP% 13.7 CDE 8.58 FLUID PACK LOT # [id number]  . CATARACT EXTRACTION W/PHACO Right 03/10/2017   Procedure: CATARACT EXTRACTION PHACO AND INTRAOCULAR LENS PLACEMENT ([place]) Right symfony toric lens;   Surgeon: Leandrew Koyanagi, MD;  Location: [facility];  Service: Ophthalmology;  Laterality: Right;  symfony toric lens prefers early  . CT of chest  10/09   bronchiectasis  . ENDOMETRIAL BIOPSY    . LAPAROSCOPY  1984   Family History  Problem Relation Age of Onset  . Heart disease Other   . Cancer Mother        pancreatic, liver  . Cancer Father        colon  . Colon cancer Father 13  . Thyroid disease Sister   . Colon polyps Sister   . Irritable bowel syndrome Sister   . Breast cancer Sister 45  . Melanoma Sister 74  . Breast cancer Maternal Aunt        dx under 11  . Colon cancer Paternal Grandmother        dx in her 77s  . Thyroid cancer Paternal Grandmother        dx in her 59s  . Miscarriages / [place] Cousin   . Diabetes Cousin   . Breast cancer Cousin 58  . Thyroid disease Sister   . Skin cancer Brother   . Thyroid disease Sister   . Glaucoma Maternal Aunt    Social History   Socioeconomic History  . Marital status: Married    Spouse name: Not on file  . Number of children: 1  . Years of education: Not on file  . Highest education level: Not on file  Occupational History  . Occupation: Merchandiser, retail: UNEMPLOYED  Tobacco Use  . Smoking status: Current Every Day Smoker    Packs/day: 1.00    Years: 56.00    Pack years: 56.00    Types: Cigarettes  . Smokeless tobacco: Never Used  Substance and Sexual Activity  . Alcohol use: Not Currently  . Drug use: No  . Sexual activity: Yes  Other Topics Concern  . Not on file  Social History Narrative   Daily Caffeine Use:  2 daily   Social Determinants of Health   Financial Resource Strain: Low Risk   . Difficulty of Paying Living Expenses: Not hard at all  Food Insecurity: No Food Insecurity  . Worried About Charity fundraiser in the Last Year: Never true  . Ran Out of Food in the Last Year: Never true  Transportation Needs: No Transportation Needs  . Lack of Transportation  (Medical): No  . Lack of Transportation (Non-Medical): No  Physical Activity: Inactive  . Days of Exercise per Week: 0 days  . Minutes of Exercise per Session: 0 min  Stress: No Stress Concern Present  . Feeling of Stress : Not at all  Social Connections:   . Frequency of Communication with Friends and Family: Not on file  . Frequency of Social Gatherings with Friends and Family: Not on file  . Attends Religious Services: Not on file  . Active Member of Clubs or Organizations: Not on file  . Attends Archivist Meetings: Not  on file  . Marital Status: Not on file    Outpatient Encounter Medications as of 12/11/2019  Medication Sig  . acetaminophen (TYLENOL) 500 MG tablet Take 500 mg by mouth every 8 (eight) hours as needed.  . cetirizine (ZYRTEC) 10 MG tablet Take 10 mg by mouth daily.    . cholecalciferol (VITAMIN D) 1000 units tablet Take 1 tablet by mouth daily.   . cyclobenzaprine (FLEXERIL) 10 MG tablet Take 1 tablet (10 mg total) by mouth at bedtime. (Patient taking differently: Take 10 mg by mouth daily as needed (migraine). )  . escitalopram (LEXAPRO) 20 MG tablet TAKE 1 TABLET DAILY  . fish oil-omega-3 fatty acids 1000 MG capsule Take 1 g by mouth daily.   . Lactobacillus (ACIDOPHILUS) 100 MG CAPS Take 1 capsule by mouth daily.    . Magnesium 250 MG TABS Take 1 tablet by mouth daily as needed (constipation).   . Melatonin 5 MG TABS Take 5 mg by mouth at bedtime. As directed at bedtime.   . montelukast (SINGULAIR) 10 MG tablet TAKE 1 TABLET AT BEDTIME  . Multiple Vitamin (MULTIVITAMIN) tablet Take 1 tablet by mouth daily.    . SUMAtriptan (IMITREX) 50 MG tablet Take 50 mg by mouth daily as needed. May repeat in 2 hours if headache persists or recurs.   . tamoxifen (NOLVADEX) 20 MG tablet Take 1 tablet (20 mg total) by mouth daily.  . Vitamin D-Vitamin K (VITAMIN K2-VITAMIN D3 PO) Take 1 capsule by mouth daily.   No facility-administered encounter medications on file as  of 12/11/2019.    Activities of Daily Living In your present state of health, do you have any difficulty performing the following activities: 12/11/2019  Hearing? N  Vision? N  Difficulty concentrating or making decisions? N  Walking or climbing stairs? N  Dressing or bathing? N  Doing errands, shopping? N  Preparing Food and eating ? N  Using the Toilet? N  In the past six months, have you accidently leaked urine? Y  Comment wears pads  Do you have problems with loss of bowel control? N  Managing your Medications? N  Managing your Finances? N  Housekeeping or managing your Housekeeping? N  Some recent data might be hidden    Patient Care Team: Tower, Wynelle Fanny, MD as PCP - General Leandrew Koyanagi, MD as Referring Physician (Ophthalmology) Mauri Pole, MD as Consulting Physician (Gastroenterology) Nicholas Lose, MD as Consulting Physician (Hematology and Oncology) Kyung Rudd, MD as Consulting Physician (Radiation Oncology) Jovita Kussmaul, MD as Consulting Physician (General Surgery) Delice Bison Charlestine Massed, NP as Nurse Practitioner (Hematology and Oncology)    Assessment:   This is a routine wellness examination for Sarsha.  Exercise Activities and Dietary recommendations Current Exercise Habits: The patient does not participate in regular exercise at present, Exercise limited by: None identified  Goals    . Increase physical activity     When weather permits, I will begin walking 60 minutes 3 days per week.     . Patient Stated     12/11/2019, I will maintain and continue medications as prescribed.        Fall Risk Fall Risk  12/11/2019 11/30/2018 02/15/2018 11/26/2017 11/20/2016  Falls in the past year? 0 0 No No Yes  Comment - - - - pt tripped and slid down stairs in home  Number falls in past yr: 0 - - - 1  Injury with Fall? 0 - - - Yes  Comment - - - -  pt stated there was pain to left elbow; ice pack placed; pt did not seek medical treatment  Risk for fall  due to : No Fall Risks - - - -  Follow up Falls evaluation completed;Falls prevention discussed - - - -   Is the patient's home free of loose throw rugs in walkways, pet beds, electrical cords, etc?   yes      Grab bars in the bathroom? no      Handrails on the stairs?   yes      Adequate lighting?   yes  Timed Get Up and Go performed: N/A  Depression Screen PHQ 2/9 Scores 12/11/2019 11/30/2018 02/15/2018 11/26/2017  PHQ - 2 Score 4 0 0 0  PHQ- 9 Score 4 0 - 0     Cognitive Function MMSE - Mini Mental State Exam 12/11/2019 11/30/2018 11/26/2017 11/20/2016  Orientation to time 5 5 5 5   Orientation to Place 5 5 5 5   Registration 3 3 3 3   Attention/ Calculation 5 0 0 0  Recall 3 3 3 3   Language- name 2 objects - 0 0 0  Language- repeat 1 1 1 1   Language- follow 3 step command - 3 3 3   Language- read & follow direction - 0 0 0  Write a sentence - 0 0 0  Copy design - 0 0 0  Total score - 20 20 20   Mini Cog  Mini-Cog screen was completed. Maximum score is 22. A value of 0 denotes this part of the MMSE was not completed or the patient failed this part of the Mini-Cog screening.       Immunization History  Administered Date(s) Administered  . Influenza Split 07/27/2011, 07/12/2013  . Influenza Whole 08/08/2002, 07/20/2007  . Influenza, High Dose Seasonal PF 08/01/2015, 07/12/2017, 07/22/2018, 07/17/2019  . Influenza, Seasonal, Injecte, Preservative Fre 08/04/2016  . Influenza-Unspecified 07/13/2014  . Pneumococcal Conjugate-13 11/19/2014  . Pneumococcal Polysaccharide-23 05/01/2005, 11/01/2012  . Td 03/13/1999, 07/24/2010  . Zoster 08/12/2014    Qualifies for Shingles Vaccine: Yes  Screening Tests Health Maintenance  Topic Date Due  . COLONOSCOPY  12/10/2020 (Originally 08/26/2019)  . MAMMOGRAM  07/19/2020  . TETANUS/TDAP  07/24/2020  . INFLUENZA VACCINE  Completed  . DEXA SCAN  Completed  . Hepatitis C Screening  Completed  . PNA vac Low Risk Adult  Completed    Cancer  Screenings: Lung: Low Dose CT Chest recommended if Age 64-80 years, 30 pack-year currently smoking OR have quit w/in 15 years. Patient does qualify. Patient wants to have this setup. Order in chart. Breast:  Up to date on Mammogram: Yes, completed 07/20/2019   Up to date of Bone Density/Dexa: Yes, completed 10/10/2019 Colorectal: declined  Additional Screenings:  Hepatitis C Screening: 11/20/2016     Plan:   Patient will maintain and continue medications as prescribed.    I have personally reviewed and noted the following in the patient's chart:   . Medical and social history . Use of alcohol, tobacco or illicit drugs  . Current medications and supplements . Functional ability and status . Nutritional status . Physical activity . Advanced directives . List of other physicians . Hospitalizations, surgeries, and ER visits in previous 12 months . Vitals . Screenings to include cognitive, depression, and falls . Referrals and appointments  In addition, I have reviewed and discussed with patient certain preventive protocols, quality metrics, and best practice recommendations. A written personalized care plan for preventive services as well as general  preventive health recommendations were provided to patient.     Andrez Grime, LPN  [id number]

## 2019-12-12 ENCOUNTER — Telehealth: Payer: Self-pay | Admitting: Family Medicine

## 2019-12-12 ENCOUNTER — Other Ambulatory Visit: Payer: Self-pay | Admitting: Family Medicine

## 2019-12-12 DIAGNOSIS — Z1329 Encounter for screening for other suspected endocrine disorder: Secondary | ICD-10-CM

## 2019-12-12 DIAGNOSIS — K219 Gastro-esophageal reflux disease without esophagitis: Secondary | ICD-10-CM

## 2019-12-12 DIAGNOSIS — Z0000 Encounter for general adult medical examination without abnormal findings: Secondary | ICD-10-CM

## 2019-12-12 DIAGNOSIS — E7800 Pure hypercholesterolemia, unspecified: Secondary | ICD-10-CM

## 2019-12-12 DIAGNOSIS — R7303 Prediabetes: Secondary | ICD-10-CM

## 2019-12-12 DIAGNOSIS — F17200 Nicotine dependence, unspecified, uncomplicated: Secondary | ICD-10-CM

## 2019-12-12 NOTE — Telephone Encounter (Signed)
-----   Message from Ellamae Sia sent at 12/04/2019  3:39 PM EST ----- Regarding: Lab orders for Wednesday, 3.3.21  AWV lab orders, please.

## 2019-12-13 ENCOUNTER — Other Ambulatory Visit: Payer: Medicare Other

## 2019-12-13 ENCOUNTER — Other Ambulatory Visit (INDEPENDENT_AMBULATORY_CARE_PROVIDER_SITE_OTHER): Payer: Medicare Other

## 2019-12-13 ENCOUNTER — Ambulatory Visit: Payer: Medicare Other

## 2019-12-13 DIAGNOSIS — E7800 Pure hypercholesterolemia, unspecified: Secondary | ICD-10-CM

## 2019-12-13 DIAGNOSIS — K219 Gastro-esophageal reflux disease without esophagitis: Secondary | ICD-10-CM | POA: Diagnosis not present

## 2019-12-13 DIAGNOSIS — Z1329 Encounter for screening for other suspected endocrine disorder: Secondary | ICD-10-CM | POA: Diagnosis not present

## 2019-12-13 DIAGNOSIS — R7303 Prediabetes: Secondary | ICD-10-CM | POA: Diagnosis not present

## 2019-12-13 LAB — LIPID PANEL
Cholesterol: 164 mg/dL (ref 0–200)
HDL: 70 mg/dL (ref >39.00)
LDL Cholesterol: 80 mg/dL (ref 0–99)
NonHDL: 94.45
Total CHOL/HDL Ratio: 2
Triglycerides: 74 mg/dL (ref 0.0–149.0)
VLDL: 14.8 mg/dL (ref 0.0–40.0)

## 2019-12-13 LAB — COMPREHENSIVE METABOLIC PANEL
ALT: 13 U/L (ref 0–35)
AST: 25 U/L (ref 0–37)
Albumin: 4 g/dL (ref 3.5–5.2)
Alkaline Phosphatase: 73 U/L (ref 39–117)
BUN: 17 mg/dL (ref 6–23)
CO2: 29 mEq/L (ref 19–32)
Calcium: 9.4 mg/dL (ref 8.4–10.5)
Chloride: 103 mEq/L (ref 96–112)
Creatinine, Ser: 0.86 mg/dL (ref 0.40–1.20)
GFR: 64.8 mL/min (ref >60.00)
Glucose, Bld: 98 mg/dL (ref 70–99)
Potassium: 4.5 mEq/L (ref 3.5–5.1)
Sodium: 139 mEq/L (ref 135–145)
Total Bilirubin: 0.5 mg/dL (ref 0.2–1.2)
Total Protein: 7.3 g/dL (ref 6.0–8.3)

## 2019-12-13 LAB — CBC WITH DIFFERENTIAL/PLATELET
Basophils Absolute: 0.1 10*3/uL (ref 0.0–0.1)
Basophils Relative: 0.8 % (ref 0.0–3.0)
Eosinophils Absolute: 0.2 10*3/uL (ref 0.0–0.7)
Eosinophils Relative: 2.7 % (ref 0.0–5.0)
HCT: 42.1 % (ref 36.0–46.0)
Hemoglobin: 14.2 g/dL (ref 12.0–15.0)
Lymphocytes Relative: 36.5 % (ref 12.0–46.0)
Lymphs Abs: 2.6 10*3/uL (ref 0.7–4.0)
MCHC: 33.6 g/dL (ref 30.0–36.0)
MCV: 96.8 fl (ref 78.0–100.0)
Monocytes Absolute: 0.9 10*3/uL (ref 0.1–1.0)
Monocytes Relative: 12.7 % — ABNORMAL HIGH (ref 3.0–12.0)
Neutro Abs: 3.3 10*3/uL (ref 1.4–7.7)
Neutrophils Relative %: 47.3 % (ref 43.0–77.0)
Platelets: 275 10*3/uL (ref 150.0–400.0)
RBC: 4.35 Mil/uL (ref 3.87–5.11)
RDW: 13.3 % (ref 11.5–15.5)
WBC: 7 10*3/uL (ref 4.0–10.5)

## 2019-12-13 LAB — HEMOGLOBIN A1C: Hgb A1c MFr Bld: 5.9 % (ref 4.6–6.5)

## 2019-12-13 LAB — TSH: TSH: 4.59 u[IU]/mL — ABNORMAL HIGH (ref 0.35–4.50)

## 2019-12-15 ENCOUNTER — Encounter: Payer: Medicare Other | Admitting: Family Medicine

## 2019-12-26 NOTE — Progress Notes (Signed)
Patient Care Team: Tower, Wynelle Fanny, MD as PCP - General Leandrew Koyanagi, MD as Referring Physician (Ophthalmology) Mauri Pole, MD as Consulting Physician (Gastroenterology) Nicholas Lose, MD as Consulting Physician (Hematology and Oncology) Kyung Rudd, MD as Consulting Physician (Radiation Oncology) Jovita Kussmaul, MD as Consulting Physician (General Surgery) Delice Bison, Charlestine Massed, NP as Nurse Practitioner (Hematology and Oncology)  DIAGNOSIS:    ICD-10-CM   1. Malignant neoplasm of lower-inner quadrant of right breast of female, estrogen receptor positive ([place])  C50.311    Z17.0     SUMMARY OF ONCOLOGIC HISTORY: Oncology History  Malignant neoplasm of lower-inner quadrant of right breast of female, estrogen receptor positive ([place])  01/31/2018 Initial Diagnosis   Right lumpectomy: IDC with DCIS 0.4 cm, margins negative, 0/2 lymph nodes negative, grade 2, ER 100%, PR 100%, HER-2 negative ratio 1.19, T1 a N0 stage I a   02/16/2018 Cancer Staging   Staging form: Breast, AJCC 8th Edition - Pathologic: Stage IA (pT1a, pN0, cM0, G2, ER+, PR+, HER2-) - Signed by Gardenia Phlegm, NP on 02/16/2018   03/03/2018 - 03/29/2018 Radiation Therapy   Adjuvant radiation therapy   03/29/2018 Genetic Testing   Negative genetic testing on the multi-cancer panel.  The Multi-Gene Panel offered by [facility] includes sequencing and/or deletion duplication testing of the following 83 genes: ALK, APC, ATM, AXIN2,BAP1,  BARD1, BLM, BMPR1A, BRCA1, BRCA2, BRIP1, CASR, CDC73, CDH1, CDK4, CDKN1B, CDKN1C, CDKN2A (p14ARF), CDKN2A (p16INK4a), CEBPA, CHEK2, CTNNA1, DICER1, DIS3L2, EGFR (c.2369C>T, p.Thr790Met variant only), EPCAM (Deletion/duplication testing only), FH, FLCN, GATA2, GPC3, GREM1 (Promoter region deletion/duplication testing only), HOXB13 (c.251G>A, p.Gly84Glu), HRAS, KIT, MAX, MEN1, MET, MITF (c.952G>A, p.Glu318Lys variant only), MLH1, MSH2, MSH3, MSH6, MUTYH, NBN, NF1, NF2, NTHL1,  PALB2, PDGFRA, PHOX2B, PMS2, POLD1, POLE, POT1, PRKAR1A, PTCH1, PTEN, RAD50, RAD51C, RAD51D, RB1, RECQL4, RET, RUNX1, SDHAF2, SDHA (sequence changes only), SDHB, SDHC, SDHD, SMAD4, SMARCA4, SMARCB1, SMARCE1, STK11, SUFU, TERT, TERT, TMEM127, TP53, TSC1, TSC2, VHL, WRN and WT1.  The report date is March 29, 2018.   03/2018 -  Anti-estrogen oral therapy   Tamoxifen daily     CHIEF COMPLIANT: Follow-up of right breast cancer on tamoxifen therapy  INTERVAL HISTORY: Cheryl Aguirre is a 74 y.o. with above-mentioned history of right breast cancer treated with lumpectomy, radiation, and who is currently on antiestrogen therapy with tamoxifen. Mammogram on 07/20/19 showed no evidence of malignancy bilaterally. Bone density scan on 10/10/19 showed osteopenia with a T-score of -2.1 at the left femur neck. She presents to the clinic today for annual follow-up.   ALLERGIES:  is allergic to adhesive [tape]; alendronate sodium; penicillins; varenicline tartrate; and doxycycline.  MEDICATIONS:  Current Outpatient Medications  Medication Sig Dispense Refill  . acetaminophen (TYLENOL) 500 MG tablet Take 500 mg by mouth every 8 (eight) hours as needed.    . cetirizine (ZYRTEC) 10 MG tablet Take 10 mg by mouth daily.      . cholecalciferol (VITAMIN D) 1000 units tablet Take 1 tablet by mouth daily.     . cyclobenzaprine (FLEXERIL) 10 MG tablet Take 1 tablet (10 mg total) by mouth at bedtime. (Patient taking differently: Take 10 mg by mouth daily as needed (migraine). ) 5 tablet 0  . escitalopram (LEXAPRO) 20 MG tablet TAKE 1 TABLET DAILY 90 tablet 1  . fish oil-omega-3 fatty acids 1000 MG capsule Take 1 g by mouth daily.     . Lactobacillus (ACIDOPHILUS) 100 MG CAPS Take 1 capsule by mouth daily.      Marland Kitchen  Magnesium 250 MG TABS Take 1 tablet by mouth daily as needed (constipation).     . Melatonin 5 MG TABS Take 5 mg by mouth at bedtime. As directed at bedtime.     . montelukast (SINGULAIR) 10 MG tablet TAKE 1  TABLET AT BEDTIME 90 tablet 2  . Multiple Vitamin (MULTIVITAMIN) tablet Take 1 tablet by mouth daily.      . SUMAtriptan (IMITREX) 50 MG tablet Take 50 mg by mouth daily as needed. May repeat in 2 hours if headache persists or recurs.     . tamoxifen (NOLVADEX) 20 MG tablet Take 1 tablet (20 mg total) by mouth daily. 90 tablet 3  . Vitamin D-Vitamin K (VITAMIN K2-VITAMIN D3 PO) Take 1 capsule by mouth daily.     No current facility-administered medications for this visit.    PHYSICAL EXAMINATION: ECOG PERFORMANCE STATUS: 1 - Symptomatic but completely ambulatory  There were no vitals filed for this visit. There were no vitals filed for this visit.  BREAST: No palpable masses or nodules in either right or left breasts. No palpable axillary supraclavicular or infraclavicular adenopathy no breast tenderness or nipple discharge. (exam performed in the presence of a chaperone)  LABORATORY DATA:  I have reviewed the data as listed CMP Latest Ref Rng & Units 12/13/2019 11/30/2018 01/21/2018  Glucose 70 - 99 mg/dL 98 102(H) 102(H)  BUN 6 - 23 mg/dL 17 17 14   Creatinine 0.40 - 1.20 mg/dL 0.86 0.91 0.89  Sodium 135 - 145 mEq/L 139 140 138  Potassium 3.5 - 5.1 mEq/L 4.5 4.3 3.9  Chloride 96 - 112 mEq/L 103 103 102  CO2 19 - 32 mEq/L 29 29 25   Calcium 8.4 - 10.5 mg/dL 9.4 9.4 9.7  Total Protein 6.0 - 8.3 g/dL 7.3 7.0 6.9  Total Bilirubin 0.2 - 1.2 mg/dL 0.5 0.4 0.6  Alkaline Phos 39 - 117 U/L 73 62 82  AST 0 - 37 U/L 25 24 28   ALT 0 - 35 U/L 13 14 18     Lab Results  Component Value Date   WBC 7.0 12/13/2019   HGB 14.2 12/13/2019   HCT 42.1 12/13/2019   MCV 96.8 12/13/2019   PLT 275.0 12/13/2019   NEUTROABS 3.3 12/13/2019    ASSESSMENT & PLAN:  Malignant neoplasm of lower-inner quadrant of right breast of female, estrogen receptor positive ([place]) 01/31/2018:Right lumpectomy: IDC with DCIS 0.4 cm, margins negative, 0/2 lymph nodes negative, grade 2, ER 100%, PR 100%, HER-2 negative ratio  1.19, T1 a N0 stage I a  Adjuvant radiation therapy 03/03/2018-03/29/2018 Treatment plan:Tamoxifen 20 mg dailyto start 04/11/2018(because the patient has severe osteoporosis we are not using aromatase inhibitor therapy)  Tamoxifen Toxicities: Denies any toxicities. Patient is generally hot natured and does not notice any difference.  Breast Cancer Surveillance: 1. Breast Exam: 12/27/2019 2. Mammogram: 07/20/2019: Benign breast density category C 3.  Bone density 10/10/2019: T score -2.1: Osteopenia bone density has improved compared to 2018.  The T score was -2.3.  RTC in 1 year for follow up    No orders of the defined types were placed in this encounter.  The patient has a good understanding of the overall plan. she agrees with it. she will call with any problems that may develop before the next visit here.  Total time spent: 20 mins including face to face time and time spent for planning, charting and coordination of care  Nicholas Lose, MD 12/27/2019  I, Molly Dorshimer, am acting as Education administrator  for Dr. Nicholas Lose.  I have reviewed the above documentation for accuracy and completeness, and I agree with the above.

## 2019-12-27 ENCOUNTER — Inpatient Hospital Stay: Payer: Medicare Other | Attending: Hematology and Oncology | Admitting: Hematology and Oncology

## 2019-12-27 ENCOUNTER — Other Ambulatory Visit: Payer: Self-pay

## 2019-12-27 DIAGNOSIS — Z170 Estrogen receptor positive status [ER+]: Secondary | ICD-10-CM | POA: Insufficient documentation

## 2019-12-27 DIAGNOSIS — Z79899 Other long term (current) drug therapy: Secondary | ICD-10-CM | POA: Insufficient documentation

## 2019-12-27 DIAGNOSIS — C50311 Malignant neoplasm of lower-inner quadrant of right female breast: Secondary | ICD-10-CM | POA: Diagnosis not present

## 2019-12-27 DIAGNOSIS — Z79810 Long term (current) use of selective estrogen receptor modulators (SERMs): Secondary | ICD-10-CM | POA: Diagnosis not present

## 2019-12-27 DIAGNOSIS — Z923 Personal history of irradiation: Secondary | ICD-10-CM | POA: Insufficient documentation

## 2019-12-27 DIAGNOSIS — M8580 Other specified disorders of bone density and structure, unspecified site: Secondary | ICD-10-CM | POA: Diagnosis not present

## 2019-12-27 MED ORDER — TURMERIC 500 MG PO CAPS: 3.0000 | ORAL_CAPSULE | Freq: Every day | ORAL | Status: DC

## 2019-12-27 MED ORDER — COLLAGEN ULTRA PO CAPS: 3.0000 | ORAL_CAPSULE | Freq: Every day | ORAL | Status: DC

## 2019-12-27 MED ORDER — TAMOXIFEN CITRATE 20 MG PO TABS: 20.0000 mg | ORAL_TABLET | Freq: Every day | ORAL | 3 refills | Status: DC

## 2019-12-27 NOTE — Assessment & Plan Note (Signed)
01/31/2018:Right lumpectomy: IDC with DCIS 0.4 cm, margins negative, 0/2 lymph nodes negative, grade 2, ER 100%, PR 100%, HER-2 negative ratio 1.19, T1 a N0 stage I a  Adjuvant radiation therapy 03/03/2018-03/29/2018 Treatment plan:Tamoxifen 20 mg dailyto start 04/11/2018(because the patient has severe osteoporosis we are not using aromatase inhibitor therapy)  Tamoxifen Toxicities: Denies any toxicities. Patient is generally hot natured and does not notice any difference.  Breast Cancer Surveillance: 1. Breast Exam: 12/27/2019 2. Mammogram: 07/20/2019: Benign breast density category C 3.  Bone density 10/10/2019: T score -2.1: Osteopenia  RTC in 1 year for follow up

## 2019-12-29 ENCOUNTER — Other Ambulatory Visit: Payer: Self-pay

## 2019-12-29 ENCOUNTER — Encounter: Payer: Self-pay | Admitting: Family Medicine

## 2019-12-29 ENCOUNTER — Telehealth: Payer: Self-pay | Admitting: Hematology and Oncology

## 2019-12-29 ENCOUNTER — Ambulatory Visit (INDEPENDENT_AMBULATORY_CARE_PROVIDER_SITE_OTHER): Payer: Medicare Other | Admitting: Family Medicine

## 2019-12-29 VITALS — BP 131/80 | HR 83 | Temp 99.2°F | Resp 18 | Ht 62.0 in | Wt 118.1 lb

## 2019-12-29 DIAGNOSIS — E7800 Pure hypercholesterolemia, unspecified: Secondary | ICD-10-CM | POA: Diagnosis not present

## 2019-12-29 DIAGNOSIS — Z1211 Encounter for screening for malignant neoplasm of colon: Secondary | ICD-10-CM

## 2019-12-29 DIAGNOSIS — M8589 Other specified disorders of bone density and structure, multiple sites: Secondary | ICD-10-CM | POA: Diagnosis not present

## 2019-12-29 DIAGNOSIS — F411 Generalized anxiety disorder: Secondary | ICD-10-CM

## 2019-12-29 DIAGNOSIS — F418 Other specified anxiety disorders: Secondary | ICD-10-CM

## 2019-12-29 DIAGNOSIS — F17200 Nicotine dependence, unspecified, uncomplicated: Secondary | ICD-10-CM

## 2019-12-29 DIAGNOSIS — E039 Hypothyroidism, unspecified: Secondary | ICD-10-CM | POA: Insufficient documentation

## 2019-12-29 DIAGNOSIS — E038 Other specified hypothyroidism: Secondary | ICD-10-CM

## 2019-12-29 DIAGNOSIS — R7303 Prediabetes: Secondary | ICD-10-CM

## 2019-12-29 MED ORDER — MONTELUKAST SODIUM 10 MG PO TABS: 10.0000 mg | ORAL_TABLET | Freq: Every day | ORAL | 3 refills | Status: DC

## 2019-12-29 NOTE — Progress Notes (Signed)
Subjective:    Patient ID: Cheryl Aguirre, female    DOB: April 15, 1946, 74 y.o.   MRN: [id number]  This visit occurred during the SARS-CoV-2 public health emergency.  Safety protocols were in place, including screening questions prior to the visit, additional usage of staff PPE, and extensive cleaning of exam room while observing appropriate contact time as indicated for disinfecting solutions.    HPI Pt presents for annual f/u of chronic health problems  Has not done much with the pandemic  Has not had a vaccine yet -- and looking forward to planning that    Wt Readings from Last 3 Encounters:  12/29/19 118 lb 2 oz (53.6 kg)  12/27/19 118 lb (53.5 kg)  12/26/18 115 lb 14.4 oz (52.6 kg)   21.61 kg/m   amw was 12/11/19 Noted she put off colonoscopy due to pandemic  Was agreeable for lung cancer screening   Last colonoscopy was 11/17 with 3 y recall (she prefers 5 y)  Her father had colon cancer as well as PGM  Smoker - still about 1ppd  No breathing problems or cough- doing ok  Thinks about quitting / not motivated    H/o R breast cancer tx with lumpectomy and radiation now taking tamoxifen  Mammogram 10/20 negative  Self breast exam - no lumps  Had a breast exam this mo with oncology   Had zoster vaccine 11/15 (zostavax)  dexa 12/20 showed osteopenia with T score of -2.1 at [facility] none  Fractures -none  Supplements -takes D and ca  Exercise -renovating a house- very physical   Taking sambuca (has zinc in it)   BP Readings from Last 3 Encounters:  12/29/19 (!) 148/70  12/27/19 (!) 146/64  12/26/18 126/67  improved on re check BP: 131/80   Pulse Readings from Last 3 Encounters:  12/29/19 83  12/27/19 86  12/26/18 87    H/o anxiety  Takes generic lexapro 20 mg daily   Prediabetes Lab Results  Component Value Date   HGBA1C 5.9 12/13/2019  no change from last check  Eats a healthy diet  Some dessert    Hyperlipidemia Lab Results  Component  Value Date   CHOL 164 12/13/2019   CHOL 171 11/30/2018   CHOL 201 (H) 11/26/2017   Lab Results  Component Value Date   HDL 70.00 12/13/2019   HDL 81.60 11/30/2018   HDL 75.10 11/26/2017   Lab Results  Component Value Date   LDLCALC 80 12/13/2019   LDLCALC 74 11/30/2018   LDLCALC 108 (H) 11/26/2017   Lab Results  Component Value Date   TRIG 74.0 12/13/2019   TRIG 74.0 11/30/2018   TRIG 93.0 11/26/2017   Lab Results  Component Value Date   CHOLHDL 2 12/13/2019   CHOLHDL 2 11/30/2018   CHOLHDL 3 11/26/2017   Lab Results  Component Value Date   LDLDIRECT 138.2 11/01/2013   LDLDIRECT 109.6 10/26/2012    Other labs Lab Results  Component Value Date   CREATININE 0.86 12/13/2019   BUN 17 12/13/2019   NA 139 12/13/2019   K 4.5 12/13/2019   CL 103 12/13/2019   CO2 29 12/13/2019   Lab Results  Component Value Date   ALT 13 12/13/2019   AST 25 12/13/2019   ALKPHOS 73 12/13/2019   BILITOT 0.5 12/13/2019   Lab Results  Component Value Date   WBC 7.0 12/13/2019   HGB 14.2 12/13/2019   HCT 42.1 12/13/2019   MCV 96.8 12/13/2019  PLT 275.0 12/13/2019   Lab Results  Component Value Date   TSH 4.59 (H) 12/13/2019    TSH is slightly high  Has had dry skin  No change in energy level    Patient Active Problem List   Diagnosis Date Noted  . Elevated TSH 12/29/2019  . Skin lesion 07/19/2018  . Genetic testing 03/30/2018  . Family history of breast cancer   . Family history of thyroid cancer   . Family history of pancreatic cancer   . Malignant neoplasm of lower-inner quadrant of right breast of female, estrogen receptor positive ([place]) 02/08/2018  . Estrogen deficiency 11/23/2016  . Prediabetes 11/14/2016  . Screening for thyroid disorder 11/14/2016  . Family history of colon cancer 11/22/2015  . Colon cancer screening 11/22/2015  . Encounter for Medicare annual wellness exam 10/31/2013  . Headache(784.0) 11/01/2012  . Gynecologic exam normal 09/15/2011  .  Routine general medical examination at a health care facility 09/07/2011  . ONYCHOMYCOSIS 07/24/2010  . PURE HYPERCHOLESTEROLEMIA 07/21/2010  . COLONIC POLYPS, HYPERPLASTIC, HX OF 02/18/2010  . Nonspecific (abnormal) findings on radiological and other examination of body structure 08/02/2008  . [place] LUNG FIELD 08/02/2008  . Osteopenia 07/20/2007  . Generalized anxiety disorder 02/11/2007  . TOBACCO ABUSE 02/11/2007  . Depression with anxiety 02/11/2007  . GERD 02/11/2007  . INSOMNIA 02/11/2007  . Fibrocystic breast changes 02/11/2007   Past Medical History:  Diagnosis Date  . Anxiety   . Arthritis    left hip  . Cancer ([place]) 2019   breast  . Chronic headaches   . Colon polyps   . Depression   . Family history of breast cancer   . Family history of colon cancer   . Family history of pancreatic cancer   . Family history of thyroid cancer   . GERD (gastroesophageal reflux disease)   . Hypertension    elevated at times.  no meds  . IBS (irritable bowel syndrome)   . Insomnia   . Osteopenia   . Personal history of radiation therapy    2019  . Tobacco abuse    Past Surgical History:  Procedure Laterality Date  . ABDOMINAL HYSTERECTOMY     bleeding, fibroid, endometriosis (total)  . BASAL CELL CARCINOMA EXCISION  11/22/2018  . BASAL CELL CARCINOMA EXCISION  07/12/2018  . BREAST LUMPECTOMY Right 01/2018  . BREAST LUMPECTOMY WITH RADIOACTIVE SEED AND SENTINEL LYMPH NODE BIOPSY Right 01/31/2018   Procedure: BREAST LUMPECTOMY WITH RADIOACTIVE SEED AND SENTINEL LYMPH NODE BIOPSY;  Surgeon: Jovita Kussmaul, MD;  Location: [place];  Service: General;  Laterality: Right;  . BREAST SURGERY  2019  . CATARACT EXTRACTION W/PHACO Left 02/11/2017   Procedure: CATARACT EXTRACTION PHACO AND INTRAOCULAR LENS PLACEMENT (IOC);  Surgeon: Leandrew Koyanagi, MD;  Location: [facility];  Service: Ophthalmology;  Laterality: Left;  [place] 1:02.8 AP% 13.7 CDE 8.58 FLUID PACK  LOT # [id number]  . CATARACT EXTRACTION W/PHACO Right 03/10/2017   Procedure: CATARACT EXTRACTION PHACO AND INTRAOCULAR LENS PLACEMENT ([place]) Right symfony toric lens;  Surgeon: Leandrew Koyanagi, MD;  Location: [place];  Service: Ophthalmology;  Laterality: Right;  symfony toric lens prefers early  . CT of chest  10/09   bronchiectasis  . ENDOMETRIAL BIOPSY    . LAPAROSCOPY  1984   Social History   Tobacco Use  . Smoking status: Current Every Day Smoker    Packs/day: 1.00    Years: 56.00    Pack  years: 56.00    Types: Cigarettes  . Smokeless tobacco: Never Used  Substance Use Topics  . Alcohol use: Not Currently  . Drug use: No   Family History  Problem Relation Age of Onset  . Heart disease Other   . Cancer Mother        pancreatic, liver  . Cancer Father        colon  . Colon cancer Father 29  . Thyroid disease Sister   . Colon polyps Sister   . Irritable bowel syndrome Sister   . Breast cancer Sister 37  . Melanoma Sister 67  . Breast cancer Maternal Aunt        dx under 80  . Colon cancer Paternal Grandmother        dx in her 57s  . Thyroid cancer Paternal Grandmother        dx in her 34s  . Miscarriages / [place] Cousin   . Diabetes Cousin   . Breast cancer Cousin 95  . Thyroid disease Sister   . Skin cancer Brother   . Thyroid disease Sister   . Glaucoma Maternal Aunt    Allergies  Allergen Reactions  . Adhesive [Tape]     Tears skin  . Alendronate Sodium     REACTION: muscle, joint pain, indigestion  . Penicillins Other (See Comments)    Has patient had a PCN reaction causing immediate rash, facial/tongue/throat swelling, SOB or lightheadedness with hypotension: yes Has patient had a PCN reaction causing severe rash involving mucus membranes or skin necrosis: no Has patient had a PCN reaction that required hospitalization: no Has patient had a PCN reaction occurring within the last 10 years: no If all of the above answers are "NO",  then may proceed with Cephalosporin use. **CHILDHOOD**  . Varenicline Tartrate Nausea Only       . Doxycycline Nausea And Vomiting and Rash        Current Outpatient Medications on File Prior to Visit  Medication Sig Dispense Refill  . acetaminophen (TYLENOL) 500 MG tablet Take 500 mg by mouth every 8 (eight) hours as needed.    . cetirizine (ZYRTEC) 10 MG tablet Take 10 mg by mouth daily.      . cholecalciferol (VITAMIN D) 1000 units tablet Take 1 tablet by mouth daily.     . cyclobenzaprine (FLEXERIL) 10 MG tablet Take 1 tablet (10 mg total) by mouth at bedtime. (Patient taking differently: Take 10 mg by mouth daily as needed (migraine). ) 5 tablet 0  . escitalopram (LEXAPRO) 20 MG tablet TAKE 1 TABLET DAILY 90 tablet 1  . fish oil-omega-3 fatty acids 1000 MG capsule Take 1 g by mouth daily.     . Lactobacillus (ACIDOPHILUS) 100 MG CAPS Take 1 capsule by mouth daily.      . Magnesium 250 MG TABS Take 1 tablet by mouth daily as needed (constipation).     . Melatonin 5 MG TABS Take 5 mg by mouth at bedtime. As directed at bedtime.     . Multiple Vitamin (MULTIVITAMIN) tablet Take 1 tablet by mouth daily.      Marland Kitchen Specialty Vitamins Products (COLLAGEN ULTRA) CAPS Take 3 capsules by mouth daily.    . SUMAtriptan (IMITREX) 50 MG tablet Take 50 mg by mouth daily as needed. May repeat in 2 hours if headache persists or recurs.     . tamoxifen (NOLVADEX) 20 MG tablet Take 1 tablet (20 mg total) by mouth daily. 90 tablet 3  .  Turmeric 500 MG CAPS Take 3 tablets by mouth daily.    . Vitamin D-Vitamin K (VITAMIN K2-VITAMIN D3 PO) Take 1 capsule by mouth daily.     No current facility-administered medications on file prior to visit.     Review of Systems  Constitutional: Negative for activity change, appetite change, fatigue, fever and unexpected weight change.  HENT: Negative for congestion, ear pain, rhinorrhea, sinus pressure and sore throat.   Eyes: Negative for pain, redness and visual  disturbance.  Respiratory: Negative for cough, shortness of breath and wheezing.   Cardiovascular: Negative for chest pain and palpitations.  Gastrointestinal: Negative for abdominal pain, blood in stool, constipation and diarrhea.  Endocrine: Negative for polydipsia and polyuria.  Genitourinary: Negative for dysuria, frequency and urgency.  Musculoskeletal: Negative for arthralgias, back pain and myalgias.  Skin: Negative for pallor and rash.  Allergic/Immunologic: Negative for environmental allergies.  Neurological: Negative for dizziness, syncope and headaches.  Hematological: Negative for adenopathy. Does not bruise/bleed easily.  Psychiatric/Behavioral: Negative for decreased concentration and dysphoric mood. The patient is not nervous/anxious.        Objective:   Physical Exam Constitutional:      General: She is not in acute distress.    Appearance: Normal appearance. She is well-developed and normal weight. She is not ill-appearing or diaphoretic.  HENT:     Head: Normocephalic and atraumatic.     Right Ear: Tympanic membrane, ear canal and external ear normal.     Left Ear: Tympanic membrane, ear canal and external ear normal.     Nose: Nose normal. No congestion.     Mouth/Throat:     Mouth: Mucous membranes are moist.     Pharynx: Oropharynx is clear. No posterior oropharyngeal erythema.  Eyes:     General: No scleral icterus.    Extraocular Movements: Extraocular movements intact.     Conjunctiva/sclera: Conjunctivae normal.     Pupils: Pupils are equal, round, and reactive to light.  Neck:     Thyroid: No thyromegaly.     Vascular: No carotid bruit or JVD.  Cardiovascular:     Rate and Rhythm: Normal rate and regular rhythm.     Pulses: Normal pulses.     Heart sounds: Normal heart sounds. No gallop.   Pulmonary:     Effort: Pulmonary effort is normal. No respiratory distress.     Breath sounds: Normal breath sounds. No wheezing.     Comments: Good air  exch Chest:     Chest wall: No tenderness.  Abdominal:     General: Bowel sounds are normal. There is no distension or abdominal bruit.     Palpations: Abdomen is soft. There is no mass.     Tenderness: There is no abdominal tenderness.     Hernia: No hernia is present.  Genitourinary:    Comments: Breast exam done recently by on oncologist Musculoskeletal:        General: No tenderness. Normal range of motion.     Cervical back: Normal range of motion and neck supple. No rigidity. No muscular tenderness.     Right lower leg: No edema.     Left lower leg: No edema.     Comments: No kyphosis    Lymphadenopathy:     Cervical: No cervical adenopathy.  Skin:    General: Skin is warm and dry.     Coloration: Skin is not pale.     Findings: No erythema or rash.     Comments: Solar  lentigines diffusely  Fair complexion  Neurological:     Mental Status: She is alert. Mental status is at baseline.     Cranial Nerves: No cranial nerve deficit.     Motor: No abnormal muscle tone.     Coordination: Coordination normal.     Gait: Gait normal.     Deep Tendon Reflexes: Reflexes are normal and symmetric. Reflexes normal.  Psychiatric:        Mood and Affect: Mood normal.        Cognition and Memory: Cognition and memory normal.           Assessment & Plan:   Problem List Items Addressed This Visit      Musculoskeletal and Integument   Osteopenia - Primary    FN T score -2.1 in December (that was slt improved)  Taking tamoxifen  No falls or fractures Takes D and Ca  Also active         Other   PURE HYPERCHOLESTEROLEMIA    Disc goals for lipids and reasons to control them Rev last labs with pt Rev low sat fat diet in detail  Good HDL/LDL ratio at 2  LDL of 80         TOBACCO ABUSE    Disc in detail risks of smoking and possible outcomes including copd, vascular/ heart disease, cancer , respiratory and sinus infections  Pt voices understanding Pt is not ready to  quit  She is interested in the lung cancer screening program       Relevant Orders   Ambulatory Referral for Lung Cancer Scre   Depression with anxiety    Continues to do well with lexapro  Reviewed stressors/ coping techniques/symptoms/ support sources/ tx options and side effects in detail today  Good self care       Colon cancer screening    Colonoscopy 11/17 with 3 y recall (family hx of colon cancer)  She prefers to do this at the 5 year mark      Prediabetes    Lab Results  Component Value Date   HGBA1C 5.9 12/13/2019   disc imp of low glycemic diet and wt loss to prevent DM2       Elevated TSH    Asymptomatic Will plan to re check with Ft4 in about 6 months         RESOLVED: Generalized anxiety disorder

## 2019-12-29 NOTE — Assessment & Plan Note (Addendum)
FN T score -2.1 in December (that was slt improved)  Taking tamoxifen  No falls or fractures Takes D and Ca  Also active

## 2019-12-29 NOTE — Patient Instructions (Addendum)
You may be hypothyroid in the future - we will watch your labs Let's re check thyroid numbers in about 6 months   Stay active  Take care of yourself  I will work on a referral for the lung cancer screening program     Our providers are recommending the vaccine to all our patients, especially if you are a patient who has medical conditions that make you high-risk for serious COVID disease.    As of right now there is NO known contraindication to getting the COVID vaccine. This means that everyone should be able to get this vaccine, even if they have other medical conditions or are pregnant. Side effects are always possible, but as with all vaccines, we believe the benefits outweigh the risks.    It is important to remember that when people are having "reactions" to the vaccine, these reactions are almost always NORMAL immune system responses. It is ok to take Benadryl/diphenhydramine for these symptoms. If you are having any trouble breathing and/or feel like your airway is swelling, go to an urgent care or emergency department. Very few people are having immune responses so strong they need to go to the emergency department, but many people are not feeling well for 1 to 2 days after the vaccine is given.   The CDC states that if you have had an allergic reaction to polyethylene glycol (PEG) or polysorbate you should NOT get an mRNA COVID-19 vaccine.  Polysorbate is not an ingredient in either mRNA COVID-19 vaccine but is closely related to PEG, which is in the vaccines.   If you have any questions about adverse reactions related to COVID-19 vaccines, please visit [email or web address] or set up a virtual visit with your primary care provider.     For questions related to COVID-19 vaccine distribution or appointments, please visit [email or web address].

## 2019-12-29 NOTE — Telephone Encounter (Signed)
Scheduled per 03/18 los, patient has been called regarding upcoming appointments and voicemail was left.

## 2019-12-30 NOTE — Assessment & Plan Note (Signed)
Continues to do well with lexapro  Reviewed stressors/ coping techniques/symptoms/ support sources/ tx options and side effects in detail today  Good self care

## 2019-12-30 NOTE — Assessment & Plan Note (Signed)
Asymptomatic Will plan to re check with [facility] in about 6 months

## 2019-12-30 NOTE — Assessment & Plan Note (Signed)
Colonoscopy 11/17 with 3 y recall (family hx of colon cancer)  She prefers to do this at the 5 year mark

## 2019-12-30 NOTE — Assessment & Plan Note (Signed)
Lab Results  Component Value Date   HGBA1C 5.9 12/13/2019   disc imp of low glycemic diet and wt loss to prevent DM2

## 2019-12-30 NOTE — Assessment & Plan Note (Signed)
Disc in detail risks of smoking and possible outcomes including copd, vascular/ heart disease, cancer , respiratory and sinus infections  Pt voices understanding Pt is not ready to quit  She is interested in the lung cancer screening program

## 2019-12-30 NOTE — Assessment & Plan Note (Signed)
Disc goals for lipids and reasons to control them Rev last labs with pt Rev low sat fat diet in detail  Good HDL/LDL ratio at 2  LDL of 80

## 2020-01-18 ENCOUNTER — Other Ambulatory Visit: Payer: Self-pay | Admitting: *Deleted

## 2020-01-18 DIAGNOSIS — F17210 Nicotine dependence, cigarettes, uncomplicated: Secondary | ICD-10-CM

## 2020-01-18 DIAGNOSIS — Z87891 Personal history of nicotine dependence: Secondary | ICD-10-CM

## 2020-02-19 ENCOUNTER — Other Ambulatory Visit: Payer: Self-pay

## 2020-02-19 ENCOUNTER — Ambulatory Visit (INDEPENDENT_AMBULATORY_CARE_PROVIDER_SITE_OTHER): Payer: Medicare Other | Admitting: Acute Care

## 2020-02-19 ENCOUNTER — Ambulatory Visit
Admission: RE | Admit: 2020-02-19 | Discharge: 2020-02-19 | Disposition: A | Discharge status: Home / Self Care | Payer: Medicare Other | Source: Ambulatory Visit | Attending: Acute Care | Admitting: Acute Care

## 2020-02-19 ENCOUNTER — Encounter: Payer: Self-pay | Admitting: Acute Care

## 2020-02-19 VITALS — BP 124/60 | HR 82 | Temp 98.6°F | Ht 63.0 in | Wt 118.0 lb

## 2020-02-19 DIAGNOSIS — F17210 Nicotine dependence, cigarettes, uncomplicated: Secondary | ICD-10-CM | POA: Diagnosis not present

## 2020-02-19 DIAGNOSIS — Z87891 Personal history of nicotine dependence: Secondary | ICD-10-CM

## 2020-02-19 DIAGNOSIS — Z122 Encounter for screening for malignant neoplasm of respiratory organs: Secondary | ICD-10-CM

## 2020-02-19 NOTE — Patient Instructions (Signed)
Thank you for participating in the [facility]. It was our pleasure to meet you today. We will call you with the results of your scan within the next few days. Your scan will be assigned a Lung RADS category score by the physicians reading the scans.  This Lung RADS score determines follow up scanning.  See below for description of categories, and follow up screening recommendations. We will be in touch to schedule your follow up screening annually or based on recommendations of our providers. We will fax a copy of your scan results to your Primary Care Physician, or the physician who referred you to the program, to ensure they have the results. Please call the office if you have any questions or concerns regarding your scanning experience or results.  Our office number is [phone]. Please speak with [name] Phelps, RN. She is our Lung Cancer Screening RN. If she is unavailable when you call, please have the office staff send her a message. She will return your call at her earliest convenience. Remember, if your scan is normal, we will scan you annually as long as you continue to meet the criteria for the program. (Age 55-77, Current smoker or smoker who has quit within the last 15 years). If you are a smoker, remember, quitting is the single most powerful action that you can take to decrease your risk of lung cancer and other pulmonary, breathing related problems. We know quitting is hard, and we are here to help.  Please let us know if there is anything we can do to help you meet your goal of quitting. If you are a former smoker, congratulations. We are proud of you! Remain smoke free! Remember you can refer friends or family members through the number above.  We will screen them to make sure they meet criteria for the program. Thank you for helping us take better care of you by participating in Lung Screening.  Lung RADS Categories:  Lung RADS 1: no nodules  or definitely non-concerning nodules.  Recommendation is for a repeat annual scan in 12 months.  Lung RADS 2:  nodules that are non-concerning in appearance and behavior with a very low likelihood of becoming an active cancer. Recommendation is for a repeat annual scan in 12 months.  Lung RADS 3: nodules that are probably non-concerning , includes nodules with a low likelihood of becoming an active cancer.  Recommendation is for a 6-month repeat screening scan. Often noted after an upper respiratory illness. We will be in touch to make sure you have no questions, and to schedule your 6-month scan.  Lung RADS 4 A: nodules with concerning findings, recommendation is most often for a follow up scan in 3 months or additional testing based on our provider's assessment of the scan. We will be in touch to make sure you have no questions and to schedule the recommended 3 month follow up scan.  Lung RADS 4 B:  indicates findings that are concerning. We will be in touch with you to schedule additional diagnostic testing based on our provider's  assessment of the scan.   

## 2020-02-19 NOTE — Progress Notes (Signed)
Shared Decision Making Visit Lung Cancer Screening Program [phone])   Eligibility:  Age 74 y.o.  Pack Years Smoking History Calculation 70 pack year smoking history (# packs/per year x # years smoked)  Recent History of coughing up blood  no  Unexplained weight loss? no ( >Than 15 pounds within the last 6 months )  Prior History Lung / other cancer no (Diagnosis within the last 5 years already requiring surveillance chest CT Scans).  Smoking Status Current Smoker  Former Smokers: Years since quit: NA  Quit Date: NA  Visit Components:  Discussion included one or more decision making aids. no  Discussion included risk/benefits of screening. no  Discussion included potential follow up diagnostic testing for abnormal scans. no  Discussion included meaning and risk of over diagnosis. no  Discussion included meaning and risk of False Positives. no  Discussion included meaning of total radiation exposure. no  Counseling Included:  Importance of adherence to annual lung cancer LDCT screening. yes  Impact of comorbidities on ability to participate in the program. yes  Ability and willingness to under diagnostic treatment. yes  Smoking Cessation Counseling:  Current Smokers:   Discussed importance of smoking cessation. yes  Information about tobacco cessation classes and interventions provided to patient. yes  Patient provided with "ticket" for LDCT Scan. yes  Symptomatic Patient. no  CounselingNA  Diagnosis Code: Tobacco Use Z72.0  Asymptomatic Patient yes  Counseling (Intermediate counseling: > three minutes counseling) [id number]  Former Smokers:   Discussed the importance of maintaining cigarette abstinence. yes  Diagnosis Code: Personal History of Nicotine Dependence. [id number]  Information about tobacco cessation classes and interventions provided to patient. Yes  Patient provided with "ticket" for LDCT Scan. yes  Written Order for Lung Cancer Screening  with LDCT placed in Epic. Yes (CT Chest Lung Cancer Screening Low Dose W/O CM) [id number] Z12.2-Screening of respiratory organs Z87.891-Personal history of nicotine dependence  I have spent 25 minutes of face to face time with Ms. Malecki discussing the risks and benefits of lung cancer screening. We viewed a power point together that explained in detail the above noted topics. We paused at intervals to allow for questions to be asked and answered to ensure understanding.We discussed that the single most powerful action that she can take to decrease her risk of developing lung cancer is to quit smoking. We discussed whether or not she is ready to commit to setting a quit date. We discussed options for tools to aid in quitting smoking including nicotine replacement therapy, non-nicotine medications, support groups, Quit Smart classes, and behavior modification. We discussed that often times setting smaller, more achievable goals, such as eliminating 1 cigarette a day for a week and then 2 cigarettes a day for a week can be helpful in slowly decreasing the number of cigarettes smoked. This allows for a sense of accomplishment as well as providing a clinical benefit. I gave her the " Be Stronger Than Your Excuses" card with contact information for community resources, classes, free nicotine replacement therapy, and access to mobile apps, text messaging, and on-line smoking cessation help. I have also given her my card and contact information in the event she needs to contact me. We discussed the time and location of the scan, and that either Doroteo Glassman RN or I will call with the results within 24-48 hours of receiving them. I have offered her  a copy of the power point we viewed  as a resource in the event they need reinforcement of  the concepts we discussed today in the office. The patient verbalized understanding of all of  the above and had no further questions upon leaving the office. They have my contact  information in the event they have any further questions.  I spent 4 minutes counseling on smoking cessation and the health risks of continued tobacco abuse.  I explained to the patient that there has been a high incidence of coronary artery disease noted on these exams. I explained that this is a non-gated exam therefore degree or severity cannot be determined. This patient is not on statin therapy. I have asked the patient to follow-up with their PCP regarding any incidental finding of coronary artery disease and management with diet or medication as their PCP  feels is clinically indicated. The patient verbalized understanding of the above and had no further questions upon completion of the visit.   Pt. Is not interested in smoking cessation counseling at this time   Cheryl Spatz, NP 02/19/2020

## 2020-02-20 NOTE — Progress Notes (Signed)
I have attempted to call the patient with the results of their LDCT. There is no answer. The phone would pick up and then link with what sounded like a fax machine. There is no opportunity to leave a message.  Langley Gauss, please see if you have any luck contacting the patient. I will also continue to try. Please schedule for a 6 month follow up scan, and fax results to PCP. Thanks so much.

## 2020-02-22 ENCOUNTER — Other Ambulatory Visit: Payer: Self-pay | Admitting: *Deleted

## 2020-02-22 DIAGNOSIS — Z87891 Personal history of nicotine dependence: Secondary | ICD-10-CM

## 2020-02-22 DIAGNOSIS — F17210 Nicotine dependence, cigarettes, uncomplicated: Secondary | ICD-10-CM

## 2020-05-18 ENCOUNTER — Other Ambulatory Visit: Payer: Self-pay | Admitting: Family Medicine

## 2020-06-11 ENCOUNTER — Telehealth (INDEPENDENT_AMBULATORY_CARE_PROVIDER_SITE_OTHER): Payer: Medicare Other | Admitting: Family Medicine

## 2020-06-11 ENCOUNTER — Other Ambulatory Visit: Payer: Self-pay

## 2020-06-11 ENCOUNTER — Encounter: Payer: Self-pay | Admitting: Family Medicine

## 2020-06-11 VITALS — Ht 63.0 in | Wt 112.0 lb

## 2020-06-11 DIAGNOSIS — J0110 Acute frontal sinusitis, unspecified: Secondary | ICD-10-CM

## 2020-06-11 DIAGNOSIS — F17200 Nicotine dependence, unspecified, uncomplicated: Secondary | ICD-10-CM

## 2020-06-11 MED ORDER — AZITHROMYCIN 250 MG PO TABS: ORAL_TABLET | ORAL | 0 refills | Status: DC

## 2020-06-11 NOTE — Progress Notes (Signed)
Virtual Visit via Video Note  I connected with Cheryl Aguirre on 06/11/20 at 11:00 AM EDT by a video enabled telemedicine application and verified that I am speaking with the correct person using two identifiers.  Location: Patient: home Provider: office   I discussed the limitations of evaluation and management by telemedicine and the availability of in person appointments. The patient expressed understanding and agreed to proceed.  Parties involved in encounter  [employer]   Provider:  Loura Pardon MD    History of Present Illness: Patient presenting with cough, sinus issues, and chest congestion. Onset of symptoms beginning of summer on and off. Having issues with her ears, nose, and throat. Patient having yellow-greenish mucous.   Patient has not been around anyone sick that she knows of. Has not been Covid tested either.   Had covid imm in April -not tested for covid   She has seasonal allergies in summer  Also a smoker  Muggy air makes it worse and makes her cough   Sinus pressure and pain  Usually R side of forehead more than the L  Most drainage is that side too -ear and throat  Ears are popping and feel full also  Tries to clear out gargle/ fluids   Nasal d/c is clear  Coughing up yellow mucous  Not a lot of wheezing- only after a cough spell   Fever off and on at night - tylenol helps  Gets achey and headache with it  Does not take her temp   Smell and taste are intact   Taking mucinex for a few weeks   Review of Systems  Constitutional: Positive for fever and malaise/fatigue. Negative for chills.  HENT: Positive for congestion, ear discharge, sinus pain and sore throat. Negative for ear pain.        Drainage on R side of throat  Eyes: Negative for blurred vision, discharge and redness.  Respiratory: Positive for cough and sputum production. Negative for shortness of breath, wheezing and stridor.   Cardiovascular: Negative for chest pain,  palpitations and leg swelling.  Gastrointestinal: Negative for abdominal pain, diarrhea, nausea and vomiting.  Musculoskeletal: Negative for myalgias.  Skin: Negative for rash.  Neurological: Positive for headaches. Negative for dizziness.    Observations/Objective: Patient appears well, in no distress Weight is baseline  No facial swelling or asymmetry Notes tenderness when pressing on R frontal sinus  Normal voice-not hoarse and no slurred speech  (sounds slightly nasal)  No obvious tremor or mobility impairment Moving neck and UEs normally Able to hear the call well  No cough or shortness of breath during interview  Talkative and mentally sharp with no cognitive changes No skin changes on face or neck , no rash or pallor Affect is normal    Assessment and Plan: Problem List Items Addressed This Visit      Respiratory   Acute sinusitis - Primary    With R frontal sinus pain and pressure following bout of allergic rhinitis in a smoker  Will cover with zpak (pcn allergic)  Nasal steroid recommended Steam/saline/fluids/rest Tylenol prn  Update if not starting to improve in a week or if worsening  (or if new symptoms or inc in temp)  Cannot confirm that she does not have covid w/o a test- given option to go get tested also        Relevant Medications   azithromycin (ZITHROMAX Z-PAK) 250 MG tablet     Other   TOBACCO ABUSE  Pt has not been ready to quit  Currently has a sinus infection-will tx with zpak           Follow Up Instructions: Drink fluids and rest Take zpak as directed flonase or nasacort nasal spray may help congestion otc Also breathe steam  Cutting or quitting smoking is also helpful  Tylenol for pain  mucinex is ok if helpful as well   Mild covid is a possibility so getting tested something to consider  Either way, avoid people until symptoms are resolved   In the future- please let us know your covid immunization dates   I discussed the  assessment and treatment plan with the patient. The patient was provided an opportunity to ask questions and all were answered. The patient agreed with the plan and demonstrated an understanding of the instructions.   The patient was advised to call back or seek an in-person evaluation if the symptoms worsen or if the condition fails to improve as anticipated.     Loura Pardon, MD

## 2020-06-11 NOTE — Patient Instructions (Signed)
Drink fluids and rest Take zpak as directed flonase or nasacort nasal spray may help congestion otc Also breathe steam  Cutting or quitting smoking is also helpful  Tylenol for pain  mucinex is ok if helpful as well   Mild covid is a possibility so getting tested something to consider  Either way, avoid people until symptoms are resolved   In the future- please let us know your covid immunization dates

## 2020-06-11 NOTE — Assessment & Plan Note (Signed)
With R frontal sinus pain and pressure following bout of allergic rhinitis in a smoker  Will cover with zpak (pcn allergic)  Nasal steroid recommended Steam/saline/fluids/rest Tylenol prn  Update if not starting to improve in a week or if worsening  (or if new symptoms or inc in temp)  Cannot confirm that she does not have covid w/o a test- given option to go get tested also

## 2020-06-11 NOTE — Assessment & Plan Note (Signed)
Pt has not been ready to quit  Currently has a sinus infection-will tx with zpak

## 2020-06-28 ENCOUNTER — Telehealth: Payer: Self-pay | Admitting: Family Medicine

## 2020-06-28 NOTE — Telephone Encounter (Signed)
Great!  Please abstract in chart

## 2020-06-28 NOTE — Telephone Encounter (Signed)
Pt called to give her covid vaccine dates 01/04/2020 and 01/25/2020.  They were Pzier.

## 2020-07-01 NOTE — Telephone Encounter (Signed)
Chart updated

## 2020-07-03 ENCOUNTER — Other Ambulatory Visit: Payer: Self-pay

## 2020-07-03 ENCOUNTER — Other Ambulatory Visit (INDEPENDENT_AMBULATORY_CARE_PROVIDER_SITE_OTHER): Payer: Medicare Other

## 2020-07-03 DIAGNOSIS — E039 Hypothyroidism, unspecified: Secondary | ICD-10-CM | POA: Diagnosis not present

## 2020-07-03 DIAGNOSIS — E038 Other specified hypothyroidism: Secondary | ICD-10-CM

## 2020-07-03 LAB — T4, FREE: Free T4: 0.67 ng/dL (ref 0.60–1.60)

## 2020-07-03 LAB — TSH: TSH: 2.95 u[IU]/mL (ref 0.35–4.50)

## 2020-07-04 ENCOUNTER — Other Ambulatory Visit: Payer: Self-pay | Admitting: Hematology and Oncology

## 2020-07-04 DIAGNOSIS — Z853 Personal history of malignant neoplasm of breast: Secondary | ICD-10-CM

## 2020-07-04 DIAGNOSIS — Z9889 Other specified postprocedural states: Secondary | ICD-10-CM

## 2020-07-22 ENCOUNTER — Ambulatory Visit
Admission: RE | Admit: 2020-07-22 | Discharge: 2020-07-22 | Disposition: A | Discharge status: Home / Self Care | Payer: Medicare Other | Source: Ambulatory Visit | Attending: Hematology and Oncology | Admitting: Hematology and Oncology

## 2020-07-22 ENCOUNTER — Other Ambulatory Visit: Payer: Self-pay

## 2020-07-22 DIAGNOSIS — Z9889 Other specified postprocedural states: Secondary | ICD-10-CM

## 2020-07-22 DIAGNOSIS — R922 Inconclusive mammogram: Secondary | ICD-10-CM | POA: Diagnosis not present

## 2020-07-22 DIAGNOSIS — Z853 Personal history of malignant neoplasm of breast: Secondary | ICD-10-CM | POA: Diagnosis not present

## 2020-07-22 HISTORY — DX: Malignant neoplasm of unspecified site of unspecified female breast: C50.919

## 2020-08-21 ENCOUNTER — Ambulatory Visit
Admission: RE | Admit: 2020-08-21 | Discharge: 2020-08-21 | Disposition: A | Discharge status: Home / Self Care | Payer: Medicare Other | Source: Ambulatory Visit | Attending: Acute Care | Admitting: Acute Care

## 2020-08-21 ENCOUNTER — Other Ambulatory Visit: Payer: Self-pay

## 2020-08-21 DIAGNOSIS — Z87891 Personal history of nicotine dependence: Secondary | ICD-10-CM

## 2020-08-21 DIAGNOSIS — I2510 Atherosclerotic heart disease of native coronary artery without angina pectoris: Secondary | ICD-10-CM | POA: Diagnosis not present

## 2020-08-21 DIAGNOSIS — J439 Emphysema, unspecified: Secondary | ICD-10-CM | POA: Diagnosis not present

## 2020-08-21 DIAGNOSIS — F17210 Nicotine dependence, cigarettes, uncomplicated: Secondary | ICD-10-CM

## 2020-08-21 DIAGNOSIS — J479 Bronchiectasis, uncomplicated: Secondary | ICD-10-CM | POA: Diagnosis not present

## 2020-08-21 DIAGNOSIS — I700 Atherosclerosis of aorta: Secondary | ICD-10-CM | POA: Diagnosis not present

## 2020-08-22 ENCOUNTER — Telehealth: Payer: Self-pay | Admitting: Acute Care

## 2020-08-22 DIAGNOSIS — F17210 Nicotine dependence, cigarettes, uncomplicated: Secondary | ICD-10-CM

## 2020-08-22 DIAGNOSIS — Z87891 Personal history of nicotine dependence: Secondary | ICD-10-CM

## 2020-08-22 NOTE — Progress Notes (Signed)
I have attempted to call the paTIENT WITH THE RESULTS OF HER LOW DOSE ct. There was no answer.  HER SCAN WAS READ AS A Lung  RADS 3, nodules that are probably benign findings, short term follow up suggested: includes nodules with a low likelihood of becoming a clinically active cancer. Radiology recommends a 6 month repeat LDCT follow up. THIS MAY BE DUE TO underlying infectious/inflammatory lung disease and waxing and waning of pulmonary nodules. Pt. Also has chronic bronchiectasis.  Langley Gauss, if the patient calls, please let her know  6 month follow up. This is most likely due to chronic infection/ inflammation.  Please order 6 month follow up CT and fax results to PCP. Thanks so much

## 2020-08-22 NOTE — Telephone Encounter (Signed)
Pt returning call for LDCT results.  SG/[name] please advise, thanks!

## 2020-08-26 NOTE — Telephone Encounter (Signed)
LMTC x 1  

## 2020-08-27 NOTE — Telephone Encounter (Signed)
Patient returning call.  [phone]

## 2020-08-28 NOTE — Telephone Encounter (Signed)
Pt informed of CT results per Sarah Groce, NP.  PT verbalized understanding.  Copy sent to PCP.  Order placed for 6 mth  f/u CT. 

## 2020-08-28 NOTE — Telephone Encounter (Signed)
LMTC x 1  

## 2020-11-14 ENCOUNTER — Other Ambulatory Visit: Payer: Self-pay | Admitting: Family Medicine

## 2020-11-14 NOTE — Telephone Encounter (Signed)
Pharmacy requests refill on: Escitalopram 20 mg   LAST REFILL: 05/20/2020 (Q-90, R-1) LAST OV: 06/11/2020 NEXT OV: Not Scheduled  PHARMACY: [facility]

## 2020-12-26 NOTE — Progress Notes (Signed)
Patient Care Team: Tower, Wynelle Fanny, MD as PCP - General Leandrew Koyanagi, MD as Referring Physician (Ophthalmology) Mauri Pole, MD as Consulting Physician (Gastroenterology) Nicholas Lose, MD as Consulting Physician (Hematology and Oncology) Kyung Rudd, MD as Consulting Physician (Radiation Oncology) Jovita Kussmaul, MD as Consulting Physician (General Surgery) Delice Bison, Charlestine Massed, NP as Nurse Practitioner (Hematology and Oncology)  DIAGNOSIS:    ICD-10-CM   1. Malignant neoplasm of lower-inner quadrant of right breast of female, estrogen receptor positive ([place])  C50.311    Z17.0     SUMMARY OF ONCOLOGIC HISTORY: Oncology History  Malignant neoplasm of lower-inner quadrant of right breast of female, estrogen receptor positive ([place])  01/31/2018 Initial Diagnosis   Right lumpectomy: IDC with DCIS 0.4 cm, margins negative, 0/2 lymph nodes negative, grade 2, ER 100%, PR 100%, HER-2 negative ratio 1.19, T1 a N0 stage I a   02/16/2018 Cancer Staging   Staging form: Breast, AJCC 8th Edition - Pathologic: Stage IA (pT1a, pN0, cM0, G2, ER+, PR+, HER2-) - Signed by Gardenia Phlegm, NP on 02/16/2018   03/03/2018 - 03/29/2018 Radiation Therapy   Adjuvant radiation therapy   03/29/2018 Genetic Testing   Negative genetic testing on the multi-cancer panel.  The Multi-Gene Panel offered by [facility] includes sequencing and/or deletion duplication testing of the following 83 genes: ALK, APC, ATM, AXIN2,BAP1,  BARD1, BLM, BMPR1A, BRCA1, BRCA2, BRIP1, CASR, CDC73, CDH1, CDK4, CDKN1B, CDKN1C, CDKN2A (p14ARF), CDKN2A (p16INK4a), CEBPA, CHEK2, CTNNA1, DICER1, DIS3L2, EGFR (c.2369C>T, p.Thr790Met variant only), EPCAM (Deletion/duplication testing only), FH, FLCN, GATA2, GPC3, GREM1 (Promoter region deletion/duplication testing only), HOXB13 (c.251G>A, p.Gly84Glu), HRAS, KIT, MAX, MEN1, MET, MITF (c.952G>A, p.Glu318Lys variant only), MLH1, MSH2, MSH3, MSH6, MUTYH, NBN, NF1, NF2, NTHL1,  PALB2, PDGFRA, PHOX2B, PMS2, POLD1, POLE, POT1, PRKAR1A, PTCH1, PTEN, RAD50, RAD51C, RAD51D, RB1, RECQL4, RET, RUNX1, SDHAF2, SDHA (sequence changes only), SDHB, SDHC, SDHD, SMAD4, SMARCA4, SMARCB1, SMARCE1, STK11, SUFU, TERT, TERT, TMEM127, TP53, TSC1, TSC2, VHL, WRN and WT1.  The report date is March 29, 2018.   03/2018 -  Anti-estrogen oral therapy   Tamoxifen daily     CHIEF COMPLIANT: Follow-up of right breast cancer on tamoxifen therapy  INTERVAL HISTORY: Cheryl Aguirre is a 75 y.o. with above-mentioned history of right breast cancer treated with lumpectomy, radiation, and who is currently on antiestrogen therapy with tamoxifen. Mammogram on 07/22/20 showed no evidence of malignancy bilaterally. She presents to the clinic today for annual follow-up.   Her major complaint is related to worsening allergies.  She continues to have hot flashes and sweats related to tamoxifen.  ALLERGIES:  is allergic to adhesive [tape], alendronate sodium, penicillins, varenicline tartrate, and doxycycline.  MEDICATIONS:  Current Outpatient Medications  Medication Sig Dispense Refill  . acetaminophen (TYLENOL) 500 MG tablet Take 500 mg by mouth every 8 (eight) hours as needed.    Marland Kitchen azithromycin (ZITHROMAX Z-PAK) 250 MG tablet Take 2 pills by mouth today and then 1 pill daily for 4 days 6 tablet 0  . cetirizine (ZYRTEC) 10 MG tablet Take 10 mg by mouth daily.      . cholecalciferol (VITAMIN D) 1000 units tablet Take 1 tablet by mouth daily.     . cyclobenzaprine (FLEXERIL) 10 MG tablet Take 1 tablet (10 mg total) by mouth at bedtime. (Patient taking differently: Take 10 mg by mouth daily as needed (migraine). ) 5 tablet 0  . escitalopram (LEXAPRO) 20 MG tablet TAKE 1 TABLET DAILY 90 tablet 2  . fish oil-omega-3 fatty acids  1000 MG capsule Take 1 g by mouth daily.     . Lactobacillus (ACIDOPHILUS) 100 MG CAPS Take 1 capsule by mouth daily.      . Magnesium 250 MG TABS Take 1 tablet by mouth daily as  needed (constipation).     . Melatonin 5 MG TABS Take 5 mg by mouth at bedtime. As directed at bedtime.     . montelukast (SINGULAIR) 10 MG tablet Take 1 tablet (10 mg total) by mouth at bedtime. 90 tablet 3  . Multiple Vitamin (MULTIVITAMIN) tablet Take 1 tablet by mouth daily.      Marland Kitchen Specialty Vitamins Products (COLLAGEN ULTRA) CAPS Take 3 capsules by mouth daily.    . SUMAtriptan (IMITREX) 50 MG tablet Take 50 mg by mouth daily as needed. May repeat in 2 hours if headache persists or recurs.  (Patient not taking: Reported on 06/11/2020)    . tamoxifen (NOLVADEX) 20 MG tablet Take 1 tablet (20 mg total) by mouth daily. 90 tablet 3  . Turmeric 500 MG CAPS Take 3 tablets by mouth daily.    . Vitamin D-Vitamin K (VITAMIN K2-VITAMIN D3 PO) Take 1 capsule by mouth daily.     No current facility-administered medications for this visit.    PHYSICAL EXAMINATION: ECOG PERFORMANCE STATUS: 1 - Symptomatic but completely ambulatory  Vitals:   12/27/20 1048  BP: (!) 146/65  Pulse: 77  Resp: 15  Temp: (!) 97.5 F (36.4 C)  SpO2: 97%   Filed Weights   12/27/20 1048  Weight: 108 lb 8 oz (49.2 kg)    BREAST: No palpable masses or nodules in either right or left breasts. No palpable axillary supraclavicular or infraclavicular adenopathy no breast tenderness or nipple discharge. (exam performed in the presence of a chaperone)  LABORATORY DATA:  I have reviewed the data as listed CMP Latest Ref Rng & Units 12/13/2019 11/30/2018 01/21/2018  Glucose 70 - 99 mg/dL 98 102(H) 102(H)  BUN 6 - 23 mg/dL _0 Creatinine 0.40 - 1.20 mg/dL 0.86 0.91 0.89  Sodium 135 - 145 mEq/L 139 140 138  Potassium 3.5 - 5.1 mEq/L 4.5 4.3 3.9  Chloride 96 - 112 mEq/L 103 103 102  CO2 19 - 32 mEq/L _1 Calcium 8.4 - 10.5 mg/dL 9.4 9.4 9.7  Total Protein 6.0 - 8.3 g/dL 7.3 7.0 6.9  Total Bilirubin 0.2 - 1.2 mg/dL 0.5 0.4 0.6  Alkaline Phos 39 - 117 U/L 73 62 82  AST 0 - 37 U/L _2 ALT 0 - 35 U/L _3 Lab Results  Component Value Date   WBC 7.0 12/13/2019   HGB 14.2 12/13/2019   HCT 42.1 12/13/2019   MCV 96.8 12/13/2019   PLT 275.0 12/13/2019   NEUTROABS 3.3 12/13/2019    ASSESSMENT & PLAN:  Malignant neoplasm of lower-inner quadrant of right breast of female, estrogen receptor positive ([place]) 01/31/2018:Right lumpectomy: IDC with DCIS 0.4 cm, margins negative, 0/2 lymph nodes negative, grade 2, ER 100%, PR 100%, HER-2 negative ratio 1.19, T1 a N0 stage I a  Adjuvant radiation therapy 03/03/2018-03/29/2018 Treatment plan:Tamoxifen 20 mg dailyto start 04/11/2018(because the patient has severe osteoporosis we are not using aromatase inhibitor therapy)  TamoxifenToxicities: Hot flashes and sweats  Breast Cancer Surveillance: 1. Breast Exam:  12/27/2020: Benign 2. Mammogram:  07/22/2020: Benign breast density category C 3.  Bone density 10/10/2019: T score -2.1: Osteopenia bone density has improved compared to 2018.  The T score was -2.3. 4.  CT chest 08/21/2020: Central lobar emphysema, bronchiectasis, stable lung nodules  RTC in 1 year for follow up    No orders of the defined types were placed in this encounter.  The patient has a good understanding of the overall plan. she agrees with it. she will call with any problems that may develop before the next visit here.  Total time spent: 20 mins including face to face time and time spent for planning, charting and coordination of care  Rulon Eisenmenger, MD, MPH 12/27/2020  I, Molly Dorshimer, am acting as scribe for Dr. Nicholas Lose.  I have reviewed the above documentation for accuracy and completeness, and I agree with the above.

## 2020-12-27 ENCOUNTER — Inpatient Hospital Stay: Payer: Medicare Other | Attending: Hematology and Oncology | Admitting: Hematology and Oncology

## 2020-12-27 ENCOUNTER — Telehealth: Payer: Self-pay | Admitting: Hematology and Oncology

## 2020-12-27 ENCOUNTER — Other Ambulatory Visit: Payer: Self-pay

## 2020-12-27 DIAGNOSIS — Z79810 Long term (current) use of selective estrogen receptor modulators (SERMs): Secondary | ICD-10-CM | POA: Insufficient documentation

## 2020-12-27 DIAGNOSIS — M8580 Other specified disorders of bone density and structure, unspecified site: Secondary | ICD-10-CM | POA: Insufficient documentation

## 2020-12-27 DIAGNOSIS — Z170 Estrogen receptor positive status [ER+]: Secondary | ICD-10-CM | POA: Insufficient documentation

## 2020-12-27 DIAGNOSIS — C50311 Malignant neoplasm of lower-inner quadrant of right female breast: Secondary | ICD-10-CM | POA: Diagnosis not present

## 2020-12-27 DIAGNOSIS — N951 Menopausal and female climacteric states: Secondary | ICD-10-CM | POA: Insufficient documentation

## 2020-12-27 MED ORDER — TURMERIC 500 MG PO CAPS: 1.0000 | ORAL_CAPSULE | Freq: Every day | ORAL | Status: AC

## 2020-12-27 MED ORDER — TAMOXIFEN CITRATE 20 MG PO TABS: 20.0000 mg | ORAL_TABLET | Freq: Every day | ORAL | 3 refills | Status: DC

## 2020-12-27 NOTE — Telephone Encounter (Signed)
Scheduled appt per 3/18 los. Pt aware.

## 2020-12-27 NOTE — Assessment & Plan Note (Signed)
01/31/2018:Right lumpectomy: IDC with DCIS 0.4 cm, margins negative, 0/2 lymph nodes negative, grade 2, ER 100%, PR 100%, HER-2 negative ratio 1.19, T1 a N0 stage I a  Adjuvant radiation therapy 03/03/2018-03/29/2018 Treatment plan:Tamoxifen 20 mg dailyto start 04/11/2018(because the patient has severe osteoporosis we are not using aromatase inhibitor therapy)  TamoxifenToxicities: Denies any toxicities. Patient is generally hot natured and does not notice any difference.  Breast Cancer Surveillance: 1. Breast Exam:  12/27/2020: Benign 2. Mammogram:  07/22/2020: Benign breast density category C 3.  Bone density 10/10/2019: T score -2.1: Osteopenia bone density has improved compared to 2018.  The T score was -2.3. 4.  CT chest 08/21/2020: Central lobar emphysema, bronchiectasis, stable lung nodules  RTC in 1 year for follow up

## 2020-12-31 ENCOUNTER — Telehealth: Payer: Self-pay | Admitting: Family Medicine

## 2020-12-31 DIAGNOSIS — R7303 Prediabetes: Secondary | ICD-10-CM

## 2020-12-31 DIAGNOSIS — Z0000 Encounter for general adult medical examination without abnormal findings: Secondary | ICD-10-CM

## 2020-12-31 DIAGNOSIS — E7800 Pure hypercholesterolemia, unspecified: Secondary | ICD-10-CM

## 2020-12-31 DIAGNOSIS — E038 Other specified hypothyroidism: Secondary | ICD-10-CM

## 2020-12-31 NOTE — Telephone Encounter (Signed)
Cheryl Aguirre, I could not sign the lab orders for this patient tomorrow because of a hard stop for insurance  Will have to print and sign waiver  thanks

## 2020-12-31 NOTE — Telephone Encounter (Signed)
-----   Message from Ellamae Sia sent at 12/16/2020  3:02 PM EST ----- Regarding: Lab orders for Wednesday, 3.23.22  AWV lab orders, please.

## 2021-01-01 ENCOUNTER — Other Ambulatory Visit: Payer: Medicare Other

## 2021-01-06 ENCOUNTER — Other Ambulatory Visit (INDEPENDENT_AMBULATORY_CARE_PROVIDER_SITE_OTHER): Payer: Medicare Other

## 2021-01-06 ENCOUNTER — Other Ambulatory Visit: Payer: Self-pay

## 2021-01-06 DIAGNOSIS — Z0000 Encounter for general adult medical examination without abnormal findings: Secondary | ICD-10-CM | POA: Diagnosis not present

## 2021-01-06 DIAGNOSIS — E038 Other specified hypothyroidism: Secondary | ICD-10-CM

## 2021-01-06 DIAGNOSIS — R7303 Prediabetes: Secondary | ICD-10-CM | POA: Diagnosis not present

## 2021-01-06 DIAGNOSIS — E7800 Pure hypercholesterolemia, unspecified: Secondary | ICD-10-CM

## 2021-01-06 LAB — LIPID PANEL
Cholesterol: 162 mg/dL (ref 0–200)
HDL: 69.3 mg/dL (ref >39.00)
LDL Cholesterol: 78 mg/dL (ref 0–99)
NonHDL: 92.88
Total CHOL/HDL Ratio: 2
Triglycerides: 73 mg/dL (ref 0.0–149.0)
VLDL: 14.6 mg/dL (ref 0.0–40.0)

## 2021-01-06 LAB — COMPREHENSIVE METABOLIC PANEL
ALT: 11 U/L (ref 0–35)
AST: 22 U/L (ref 0–37)
Albumin: 4 g/dL (ref 3.5–5.2)
Alkaline Phosphatase: 68 U/L (ref 39–117)
BUN: 13 mg/dL (ref 6–23)
CO2: 29 mEq/L (ref 19–32)
Calcium: 9.2 mg/dL (ref 8.4–10.5)
Chloride: 103 mEq/L (ref 96–112)
Creatinine, Ser: 0.79 mg/dL (ref 0.40–1.20)
GFR: 74.22 mL/min (ref >60.00)
Glucose, Bld: 103 mg/dL — ABNORMAL HIGH (ref 70–99)
Potassium: 4.2 mEq/L (ref 3.5–5.1)
Sodium: 139 mEq/L (ref 135–145)
Total Bilirubin: 0.3 mg/dL (ref 0.2–1.2)
Total Protein: 6.7 g/dL (ref 6.0–8.3)

## 2021-01-06 LAB — CBC WITH DIFFERENTIAL/PLATELET
Basophils Absolute: 0.1 10*3/uL (ref 0.0–0.1)
Basophils Relative: 1.1 % (ref 0.0–3.0)
Eosinophils Absolute: 0.2 10*3/uL (ref 0.0–0.7)
Eosinophils Relative: 3 % (ref 0.0–5.0)
HCT: 41 % (ref 36.0–46.0)
Hemoglobin: 13.9 g/dL (ref 12.0–15.0)
Lymphocytes Relative: 30.6 % (ref 12.0–46.0)
Lymphs Abs: 2 10*3/uL (ref 0.7–4.0)
MCHC: 33.9 g/dL (ref 30.0–36.0)
MCV: 96.4 fl (ref 78.0–100.0)
Monocytes Absolute: 0.8 10*3/uL (ref 0.1–1.0)
Monocytes Relative: 12.3 % — ABNORMAL HIGH (ref 3.0–12.0)
Neutro Abs: 3.5 10*3/uL (ref 1.4–7.7)
Neutrophils Relative %: 53 % (ref 43.0–77.0)
Platelets: 254 10*3/uL (ref 150.0–400.0)
RBC: 4.25 Mil/uL (ref 3.87–5.11)
RDW: 13.4 % (ref 11.5–15.5)
WBC: 6.6 10*3/uL (ref 4.0–10.5)

## 2021-01-06 LAB — T4, FREE: Free T4: 0.76 ng/dL (ref 0.60–1.60)

## 2021-01-06 LAB — HEMOGLOBIN A1C: Hgb A1c MFr Bld: 5.9 % (ref 4.6–6.5)

## 2021-01-06 LAB — TSH: TSH: 5.49 u[IU]/mL — ABNORMAL HIGH (ref 0.35–4.50)

## 2021-01-08 ENCOUNTER — Encounter: Payer: Self-pay | Admitting: Family Medicine

## 2021-01-08 ENCOUNTER — Encounter: Payer: Self-pay | Admitting: Gastroenterology

## 2021-01-08 ENCOUNTER — Other Ambulatory Visit: Payer: Self-pay

## 2021-01-08 ENCOUNTER — Ambulatory Visit (INDEPENDENT_AMBULATORY_CARE_PROVIDER_SITE_OTHER): Payer: Medicare Other | Admitting: Family Medicine

## 2021-01-08 VITALS — BP 130/78 | HR 75 | Temp 97.5°F | Ht 62.1 in | Wt 107.2 lb

## 2021-01-08 DIAGNOSIS — Z1211 Encounter for screening for malignant neoplasm of colon: Secondary | ICD-10-CM

## 2021-01-08 DIAGNOSIS — R109 Unspecified abdominal pain: Secondary | ICD-10-CM | POA: Diagnosis not present

## 2021-01-08 DIAGNOSIS — J0110 Acute frontal sinusitis, unspecified: Secondary | ICD-10-CM

## 2021-01-08 DIAGNOSIS — R1310 Dysphagia, unspecified: Secondary | ICD-10-CM | POA: Insufficient documentation

## 2021-01-08 DIAGNOSIS — Z0000 Encounter for general adult medical examination without abnormal findings: Secondary | ICD-10-CM

## 2021-01-08 DIAGNOSIS — J301 Allergic rhinitis due to pollen: Secondary | ICD-10-CM

## 2021-01-08 DIAGNOSIS — F418 Other specified anxiety disorders: Secondary | ICD-10-CM | POA: Diagnosis not present

## 2021-01-08 DIAGNOSIS — M8589 Other specified disorders of bone density and structure, multiple sites: Secondary | ICD-10-CM

## 2021-01-08 DIAGNOSIS — F17200 Nicotine dependence, unspecified, uncomplicated: Secondary | ICD-10-CM | POA: Diagnosis not present

## 2021-01-08 DIAGNOSIS — E038 Other specified hypothyroidism: Secondary | ICD-10-CM | POA: Diagnosis not present

## 2021-01-08 DIAGNOSIS — R7303 Prediabetes: Secondary | ICD-10-CM

## 2021-01-08 DIAGNOSIS — R1319 Other dysphagia: Secondary | ICD-10-CM | POA: Diagnosis not present

## 2021-01-08 DIAGNOSIS — R10A0 Flank pain, unspecified side: Secondary | ICD-10-CM

## 2021-01-08 DIAGNOSIS — E7800 Pure hypercholesterolemia, unspecified: Secondary | ICD-10-CM | POA: Diagnosis not present

## 2021-01-08 DIAGNOSIS — J309 Allergic rhinitis, unspecified: Secondary | ICD-10-CM | POA: Insufficient documentation

## 2021-01-08 LAB — POC URINALSYSI DIPSTICK (AUTOMATED)
Bilirubin, UA: NEGATIVE
Blood, UA: NEGATIVE
Glucose, UA: NEGATIVE
Ketones, UA: NEGATIVE
Leukocytes, UA: NEGATIVE
Nitrite, UA: NEGATIVE
Protein, UA: NEGATIVE
Spec Grav, UA: 1.01 (ref 1.010–1.025)
Urobilinogen, UA: 0.2 E.U./dL
pH, UA: 7 (ref 5.0–8.0)

## 2021-01-08 MED ORDER — MONTELUKAST SODIUM 10 MG PO TABS: 10.0000 mg | ORAL_TABLET | Freq: Every day | ORAL | 3 refills | Status: DC

## 2021-01-08 MED ORDER — AZITHROMYCIN 250 MG PO TABS: ORAL_TABLET | ORAL | 0 refills | Status: DC

## 2021-01-08 MED ORDER — ESCITALOPRAM OXALATE 20 MG PO TABS: 20.0000 mg | ORAL_TABLET | Freq: Every day | ORAL | 3 refills | Status: DC

## 2021-01-08 NOTE — Assessment & Plan Note (Signed)
Lab Results  Component Value Date   HGBA1C 5.9 01/06/2021   disc imp of low glycemic diet and wt loss to prevent DM2  Wt loss noted- may need inc protein calories

## 2021-01-08 NOTE — Assessment & Plan Note (Signed)
Esophageal c/o  Worse since radiation tx for breast cancer Also occ heartburn  Ref to GI done

## 2021-01-08 NOTE — Assessment & Plan Note (Signed)
Pt c/o fatigue but also weight loss- so this is confusing Elevated tsh and low nl FT4 Also takes tamoxifen for breast cancer  Ref to endocrinology

## 2021-01-08 NOTE — Patient Instructions (Addendum)
Use flonase twice daily for 2-3 days then once daily through the allergy season  Stop the vics spray  Continue the zyrtec and singulaire  Steer clear of sudafed   If you are interested in the new shingles vaccine (Shingrix) - call your local pharmacy to check on coverage and availability  If affordable, get on a wait list at your pharmacy to get the vaccine.  Take zpak for sinus infection   Lets get a urine sample on the way out for flank pain    We may need to schedule a follow up for neck/shoulder area pain and also headaches if they continue and for back pain and fatigue  (so sorry)

## 2021-01-08 NOTE — Assessment & Plan Note (Signed)
Due for colonoscopy/ in setting of family hx  Ref made to GI for this and dysphagia

## 2021-01-08 NOTE — Assessment & Plan Note (Signed)
Reviewed health habits including diet and exercise and skin cancer prevention Reviewed appropriate screening tests for age  Also reviewed health mt list, fam hx and immunization status , as well as social and family history   See HPI Labs reviewed  Mammogram and breast cancer f/u is up to date  Ref made for colonoscopy  Considering shingrix if covered  dexa utd and no falls or fractures  Advance directive if up to date No cognitive concerns Dec hearing in R ear on screening (may be due to sinus issues)  Stable vision

## 2021-01-08 NOTE — Progress Notes (Signed)
Subjective:    Patient ID: Cheryl Aguirre, female    DOB: Apr 03, 1946, 75 y.o.   MRN: [id number]  This visit occurred during the SARS-CoV-2 public health emergency.  Safety protocols were in place, including screening questions prior to the visit, additional usage of staff PPE, and extensive cleaning of exam room while observing appropriate contact time as indicated for disinfecting solutions.    HPI Pt presents for amw and annual f/u of chronic medical problems   I have personally reviewed the Medicare Annual Wellness questionnaire and have noted 1. The patient's medical and social history 2. Their use of alcohol, tobacco or illicit drugs 3. Their current medications and supplements 4. The patient's functional ability including ADL's, fall risks, home safety risks and hearing or visual             impairment. 5. Diet and physical activities 6. Evidence for depression or mood disorders  The patients weight, height, BMI have been recorded in the chart and visual acuity is per eye clinic.  I have made referrals, counseling and provided education to the patient based review of the above and I have provided the pt with a written personalized care plan for preventive services. Reviewed and updated provider list, see scanned forms.  See scanned forms.  Routine anticipatory guidance given to patient.  See health maintenance. Colon cancer screening  Colonoscopy 11/17 with 3 y recall Due for this  Also wants to discuss esophageal spasms since radiation   Breast cancer screening   Mammogram 10/21 Personal h/o breast cancer on tamoxifen Self breast exam- has some tender scar tissue, did see her oncologist  Flu vaccine  9/21 Tetanus vaccine 10/11  Pneumovax - complete Zoster vaccine- zostavax 11/15/ interested in shingrix if covered  Dexa  12/20  Falls none Fractures-none Tamoxifen  Supplements- vit D with K  Exercise -stretching and walking   (pulled a muscle in her shoulder) -  used a pain patch and tylenol  Woke up with it  She changes pillow frequently   Advance directive-up to date  Cognitive function addressed- see scanned forms- and if abnormal then additional documentation follows.   No big changes  Some short term forgetfulness  Does finances and reads a lot   PMH and SH reviewed  Meds, vitals, and allergies reviewed.   ROS: See HPI.  Otherwise negative.    Weight : Wt Readings from Last 3 Encounters:  01/08/21 107 lb 4 oz (48.6 kg)  12/27/20 108 lb 8 oz (49.2 kg)  06/11/20 112 lb (50.8 kg)   19.55 kg/m  Has lost weight -appetite is fine   Very tired    Hearing/vision:  Hearing Screening   125Hz  250Hz  500Hz  1000Hz  2000Hz  3000Hz  4000Hz  6000Hz  8000Hz   Right ear:  20 20 20 20   0    Left ear:  40 40 40 40  40      Visual Acuity Screening   Right eye Left eye Both eyes  Without correction: 20/30 20/30 20/30   With correction:      Thinks her R ear drains more  This is the area she has sinus problems with   Care team  Cheryl Aguirre-[facility] [employer]- gen sx   BP Readings from Last 3 Encounters:  01/08/21 130/78  12/27/20 (!) 146/65  02/19/20 124/60   Pulse Readings from Last 3 Encounters:  01/08/21 75  12/27/20 77  02/19/20 82   Subclinical hypothyroidism  Lab Results  Component Value Date  TSH 5.49 (H) 01/06/2021  free T4 of 0.76 Is fatigued    Prediabetes Lab Results  Component Value Date   HGBA1C 5.9 01/06/2021   Hyperlipidemia Lab Results  Component Value Date   CHOL 162 01/06/2021   CHOL 164 12/13/2019   CHOL 171 11/30/2018   Lab Results  Component Value Date   HDL 69.30 01/06/2021   HDL 70.00 12/13/2019   HDL 81.60 11/30/2018   Lab Results  Component Value Date   LDLCALC 78 01/06/2021   LDLCALC 80 12/13/2019   LDLCALC 74 11/30/2018   Lab Results  Component Value Date   TRIG 73.0 01/06/2021   TRIG 74.0 12/13/2019   TRIG 74.0 11/30/2018   Lab Results   Component Value Date   CHOLHDL 2 01/06/2021   CHOLHDL 2 12/13/2019   CHOLHDL 2 11/30/2018   Lab Results  Component Value Date   LDLDIRECT 138.2 11/01/2013   LDLDIRECT 109.6 10/26/2012   Smoking status  About 1 ppd  Not ready to quit   Some mid back/flank pain  Results for orders placed or performed in visit on 01/08/21  POCT Urinalysis Dipstick (Automated)  Result Value Ref Range   Color, UA yellow    Clarity, UA clear    Glucose, UA Negative Negative   Bilirubin, UA negative    Ketones, UA negative    Spec Grav, UA 1.010 1.010 - 1.025   Blood, UA negative    pH, UA 7.0 5.0 - 8.0   Protein, UA Negative Negative   Urobilinogen, UA 0.2 0.2 or 1.0 E.U./dL   Nitrite, UA negative    Leukocytes, UA Negative Negative     Worse allergy symptoms Zyrtec and singulair Worse headaches also  Usually congestion is the worst  Mucinex/sudafed over the counter A lot of pnd - some yellow mucous  No fever Has headache - usually R side  Maxillary area  occ head and back of head get tender   Nasal spray  flonase- as needed  vics - daily / immed relief   H/o migraine- last px sumatriptan by Dr Manuella Ghazi      Patient Active Problem List   Diagnosis Date Noted  . Dysphagia 01/08/2021  . Allergic rhinitis 01/08/2021  . Flank pain 01/08/2021  . Subclinical hypothyroidism 12/29/2019  . Genetic testing 03/30/2018  . Family history of breast cancer   . Family history of thyroid cancer   . Family history of pancreatic cancer   . Malignant neoplasm of lower-inner quadrant of right breast of female, estrogen receptor positive ([place]) 02/08/2018  . Estrogen deficiency 11/23/2016  . Prediabetes 11/14/2016  . Screening for thyroid disorder 11/14/2016  . Family history of colon cancer 11/22/2015  . Colon cancer screening 11/22/2015  . Encounter for Medicare annual wellness exam 10/31/2013  . Headache(784.0) 11/01/2012  . Gynecologic exam normal 09/15/2011  . Routine general medical  examination at a health care facility 09/07/2011  . Acute sinusitis 10/30/2010  . ONYCHOMYCOSIS 07/24/2010  . PURE HYPERCHOLESTEROLEMIA 07/21/2010  . COLONIC POLYPS, HYPERPLASTIC, HX OF 02/18/2010  . Nonspecific (abnormal) findings on radiological and other examination of body structure 08/02/2008  . [place] LUNG FIELD 08/02/2008  . Osteopenia 07/20/2007  . TOBACCO ABUSE 02/11/2007  . Depression with anxiety 02/11/2007  . GERD 02/11/2007  . INSOMNIA 02/11/2007  . Fibrocystic breast changes 02/11/2007   Past Medical History:  Diagnosis Date  . Anxiety   . Arthritis    left hip  . Breast  cancer ([place])   . Cancer ([place]) 2019   breast  . Chronic headaches   . Colon polyps   . Depression   . Family history of breast cancer   . Family history of colon cancer   . Family history of pancreatic cancer   . Family history of thyroid cancer   . GERD (gastroesophageal reflux disease)   . Hypertension    elevated at times.  no meds  . IBS (irritable bowel syndrome)   . Insomnia   . Osteopenia   . Personal history of radiation therapy    2019  . Tobacco abuse    Past Surgical History:  Procedure Laterality Date  . ABDOMINAL HYSTERECTOMY     bleeding, fibroid, endometriosis (total)  . BASAL CELL CARCINOMA EXCISION  11/22/2018  . BASAL CELL CARCINOMA EXCISION  07/12/2018  . BREAST LUMPECTOMY Right 01/2018  . BREAST LUMPECTOMY WITH RADIOACTIVE SEED AND SENTINEL LYMPH NODE BIOPSY Right 01/31/2018   Procedure: BREAST LUMPECTOMY WITH RADIOACTIVE SEED AND SENTINEL LYMPH NODE BIOPSY;  Surgeon: Jovita Kussmaul, MD;  Location: [facility];  Service: General;  Laterality: Right;  . BREAST SURGERY  2019  . CATARACT EXTRACTION W/PHACO Left 02/11/2017   Procedure: CATARACT EXTRACTION PHACO AND INTRAOCULAR LENS PLACEMENT (IOC);  Surgeon: Leandrew Koyanagi, MD;  Location: [facility];  Service: Ophthalmology;  Laterality: Left;  [place] 1:02.8 AP% 13.7 CDE 8.58 FLUID PACK LOT #  [id number]  . CATARACT EXTRACTION W/PHACO Right 03/10/2017   Procedure: CATARACT EXTRACTION PHACO AND INTRAOCULAR LENS PLACEMENT ([place]) Right symfony toric lens;  Surgeon: Leandrew Koyanagi, MD;  Location: [facility];  Service: Ophthalmology;  Laterality: Right;  symfony toric lens prefers early  . CT of chest  10/09   bronchiectasis  . ENDOMETRIAL BIOPSY    . LAPAROSCOPY  1984   Social History   Tobacco Use  . Smoking status: Current Every Day Smoker    Packs/day: 1.25    Years: 56.00    Pack years: 70.00    Types: Cigarettes  . Smokeless tobacco: Never Used  Vaping Use  . Vaping Use: Never used  Substance Use Topics  . Alcohol use: Not Currently  . Drug use: No   Family History  Problem Relation Age of Onset  . Heart disease Other   . Cancer Mother        pancreatic, liver  . Cancer Father        colon  . Colon cancer Father 45  . Thyroid disease Sister   . Colon polyps Sister   . Irritable bowel syndrome Sister   . Breast cancer Sister 65  . Melanoma Sister 29  . Breast cancer Maternal Aunt        dx under 84  . Colon cancer Paternal Grandmother        dx in her 29s  . Thyroid cancer Paternal Grandmother        dx in her 26s  . Miscarriages / [place] Cousin   . Diabetes Cousin   . Breast cancer Cousin 38  . Thyroid disease Sister   . Skin cancer Brother   . Thyroid disease Sister   . Glaucoma Maternal Aunt    Allergies  Allergen Reactions  . Adhesive [Tape]     Tears skin  . Alendronate Sodium     REACTION: muscle, joint pain, indigestion  . Penicillins Other (See Comments)    Has patient had a PCN reaction causing immediate rash, facial/tongue/throat swelling, SOB or lightheadedness with hypotension:  yes Has patient had a PCN reaction causing severe rash involving mucus membranes or skin necrosis: no Has patient had a PCN reaction that required hospitalization: no Has patient had a PCN reaction occurring within the last 10 years: no If all  of the above answers are "NO", then may proceed with Cephalosporin use. **CHILDHOOD**  . Varenicline Tartrate Nausea Only       . Doxycycline Nausea And Vomiting and Rash        Current Outpatient Medications on File Prior to Visit  Medication Sig Dispense Refill  . acetaminophen (TYLENOL) 500 MG tablet Take 500 mg by mouth every 8 (eight) hours as needed.    . cetirizine (ZYRTEC) 10 MG tablet Take 10 mg by mouth daily.    . cyclobenzaprine (FLEXERIL) 10 MG tablet Take 1 tablet (10 mg total) by mouth at bedtime. (Patient taking differently: Take 10 mg by mouth daily as needed (migraine).) 5 tablet 0  . fish oil-omega-3 fatty acids 1000 MG capsule Take 1 g by mouth daily.    . Lactobacillus (ACIDOPHILUS) 100 MG CAPS Take 1 capsule by mouth daily.    . Magnesium 250 MG TABS Take 1 tablet by mouth daily as needed (constipation).     . Melatonin 5 MG TABS Take 5 mg by mouth at bedtime. As directed at bedtime.    . Multiple Vitamin (MULTIVITAMIN) tablet Take 1 tablet by mouth daily.    . SUMAtriptan (IMITREX) 50 MG tablet Take 50 mg by mouth daily as needed. May repeat in 2 hours if headache persists or recurs.    . tamoxifen (NOLVADEX) 20 MG tablet Take 1 tablet (20 mg total) by mouth daily. 90 tablet 3  . Turmeric 500 MG CAPS Take 1 tablet by mouth daily.    . Vitamin D-Vitamin K (VITAMIN K2-VITAMIN D3 PO) Take 1 capsule by mouth daily.     No current facility-administered medications on file prior to visit.    Review of Systems  Constitutional: Positive for fatigue and unexpected weight change. Negative for activity change, appetite change and fever.  HENT: Positive for congestion, postnasal drip and sinus pressure. Negative for ear pain, rhinorrhea and sore throat.   Eyes: Negative for pain, redness and visual disturbance.  Respiratory: Negative for cough, shortness of breath and wheezing.   Cardiovascular: Negative for chest pain, palpitations and leg swelling.  Gastrointestinal:  Negative for abdominal pain, blood in stool, constipation and diarrhea.  Endocrine: Negative for polydipsia and polyuria.  Genitourinary: Negative for dysuria, frequency and urgency.  Musculoskeletal: Positive for back pain, myalgias and neck pain. Negative for arthralgias.  Skin: Negative for pallor and rash.  Allergic/Immunologic: Negative for environmental allergies.  Neurological: Positive for headaches. Negative for dizziness and syncope.  Hematological: Negative for adenopathy. Does not bruise/bleed easily.  Psychiatric/Behavioral: Negative for decreased concentration and dysphoric mood. The patient is not nervous/anxious.        Objective:   Physical Exam Constitutional:      General: She is not in acute distress.    Appearance: Normal appearance. She is well-developed and normal weight. She is not ill-appearing or diaphoretic.  HENT:     Head: Normocephalic and atraumatic.     Comments: Tender R maxillary sinus    Right Ear: Tympanic membrane, ear canal and external ear normal.     Left Ear: Tympanic membrane, ear canal and external ear normal.     Nose: Congestion present.     Mouth/Throat:     Mouth: Mucous membranes  are moist.     Pharynx: Oropharynx is clear. No posterior oropharyngeal erythema.  Eyes:     General: No scleral icterus.    Extraocular Movements: Extraocular movements intact.     Conjunctiva/sclera: Conjunctivae normal.     Pupils: Pupils are equal, round, and reactive to light.  Neck:     Thyroid: No thyromegaly.     Vascular: No carotid bruit or JVD.  Cardiovascular:     Rate and Rhythm: Normal rate and regular rhythm.     Pulses: Normal pulses.     Heart sounds: Normal heart sounds. No gallop.   Pulmonary:     Effort: Pulmonary effort is normal. No respiratory distress.     Breath sounds: Normal breath sounds. No wheezing.     Comments: Good air exch Chest:     Chest wall: No tenderness.  Abdominal:     General: Bowel sounds are normal. There  is no distension or abdominal bruit.     Palpations: Abdomen is soft. There is no mass.     Tenderness: There is no abdominal tenderness.     Hernia: No hernia is present.  Genitourinary:    Comments: Breast exam was recently done by oncologist Musculoskeletal:        General: No tenderness. Normal range of motion.     Cervical back: Normal range of motion and neck supple. No rigidity. No muscular tenderness.     Right lower leg: No edema.     Left lower leg: No edema.     Comments: Mild kyphosis   Lymphadenopathy:     Cervical: No cervical adenopathy.  Skin:    General: Skin is warm and dry.     Coloration: Skin is not pale.     Findings: No erythema or rash.     Comments: Solar lentigines diffusely Some SKs   Neurological:     Mental Status: She is alert. Mental status is at baseline.     Cranial Nerves: No cranial nerve deficit.     Motor: No abnormal muscle tone.     Coordination: Coordination normal.     Gait: Gait normal.     Deep Tendon Reflexes: Reflexes are normal and symmetric. Reflexes normal.  Psychiatric:        Mood and Affect: Mood normal.        Cognition and Memory: Cognition and memory normal.           Assessment & Plan:   Problem List Items Addressed This Visit      Respiratory   Acute sinusitis    In the setting of allergy congestion  R facial pain and purulent mucous Adv to use flonase regularly along with antihistamine  zpak px  Update if not starting to improve in a week or if worsening         Relevant Medications   azithromycin (ZITHROMAX Z-PAK) 250 MG tablet   Allergic rhinitis    With congestion and now s/s of sinusitis Adv use of flonase regularly instead of prn  Adv d/c vics nasal spray due to poss rebound congestion  Continue zyrtec and singulair         Digestive   Dysphagia    Esophageal c/o  Worse since radiation tx for breast cancer Also occ heartburn  Ref to GI done      Relevant Orders   Ambulatory referral to  Gastroenterology     Endocrine   Subclinical hypothyroidism    Pt c/o fatigue but also  weight loss- so this is confusing Elevated tsh and low nl FT4 Also takes tamoxifen for breast cancer  Ref to endocrinology       Relevant Orders   Ambulatory referral to Endocrinology     Musculoskeletal and Integument   Osteopenia    Pt takes tamoxifen  No falls or fractures dexa 12/20 Followed by oncology Fairly good exercise and taking vit D          Other   PURE HYPERCHOLESTEROLEMIA    Disc goals for lipids and reasons to control them Rev last labs with pt Rev low sat fat diet in detail Stable LDL of 78 with good diet       TOBACCO ABUSE    Disc in detail risks of smoking and possible outcomes including copd, vascular/ heart disease, cancer , respiratory and sinus infections  Pt voices understanding Pt is not ready to quit  Does have serial CT scans for lung cancer screening      Depression with anxiety    Continues lexapro Some more fatigue lately  ? If sub clinical hypothyroid adds to this      Relevant Medications   escitalopram (LEXAPRO) 20 MG tablet   Encounter for Medicare annual wellness exam - Primary    Reviewed health habits including diet and exercise and skin cancer prevention Reviewed appropriate screening tests for age  Also reviewed health mt list, fam hx and immunization status , as well as social and family history   See HPI Labs reviewed  Mammogram and breast cancer f/u is up to date  Ref made for colonoscopy  Considering shingrix if covered  dexa utd and no falls or fractures  Advance directive if up to date No cognitive concerns Dec hearing in R ear on screening (may be due to sinus issues)  Stable vision        Colon cancer screening    Due for colonoscopy/ in setting of family hx  Ref made to GI for this and dysphagia       Prediabetes    Lab Results  Component Value Date   HGBA1C 5.9 01/06/2021   disc imp of low glycemic diet and  wt loss to prevent DM2  Wt loss noted- may need inc protein calories      Flank pain   Relevant Orders   POCT Urinalysis Dipstick (Automated) (Completed)

## 2021-01-08 NOTE — Assessment & Plan Note (Signed)
Disc in detail risks of smoking and possible outcomes including copd, vascular/ heart disease, cancer , respiratory and sinus infections  Pt voices understanding Pt is not ready to quit  Does have serial CT scans for lung cancer screening

## 2021-01-08 NOTE — Assessment & Plan Note (Signed)
Disc goals for lipids and reasons to control them Rev last labs with pt Rev low sat fat diet in detail Stable LDL of 78 with good diet

## 2021-01-08 NOTE — Assessment & Plan Note (Signed)
In the setting of allergy congestion  R facial pain and purulent mucous Adv to use flonase regularly along with antihistamine  zpak px  Update if not starting to improve in a week or if worsening

## 2021-01-08 NOTE — Assessment & Plan Note (Signed)
With congestion and now s/s of sinusitis Adv use of flonase regularly instead of prn  Adv d/c vics nasal spray due to poss rebound congestion  Continue zyrtec and singulair

## 2021-01-08 NOTE — Assessment & Plan Note (Signed)
Pt takes tamoxifen  No falls or fractures dexa 12/20 Followed by oncology Fairly good exercise and taking vit D

## 2021-01-08 NOTE — Assessment & Plan Note (Signed)
Continues lexapro Some more fatigue lately  ? If sub clinical hypothyroid adds to this

## 2021-02-27 ENCOUNTER — Ambulatory Visit
Admission: RE | Admit: 2021-02-27 | Discharge: 2021-02-27 | Disposition: A | Discharge status: Home / Self Care | Payer: Medicare Other | Source: Ambulatory Visit | Attending: Acute Care | Admitting: Acute Care

## 2021-02-27 ENCOUNTER — Ambulatory Visit (INDEPENDENT_AMBULATORY_CARE_PROVIDER_SITE_OTHER): Payer: Medicare Other | Admitting: Gastroenterology

## 2021-02-27 ENCOUNTER — Encounter: Payer: Self-pay | Admitting: Gastroenterology

## 2021-02-27 VITALS — BP 118/78 | HR 79 | Ht 62.1 in | Wt 107.0 lb

## 2021-02-27 DIAGNOSIS — K5904 Chronic idiopathic constipation: Secondary | ICD-10-CM | POA: Diagnosis not present

## 2021-02-27 DIAGNOSIS — J439 Emphysema, unspecified: Secondary | ICD-10-CM | POA: Diagnosis not present

## 2021-02-27 DIAGNOSIS — J479 Bronchiectasis, uncomplicated: Secondary | ICD-10-CM | POA: Diagnosis not present

## 2021-02-27 DIAGNOSIS — K219 Gastro-esophageal reflux disease without esophagitis: Secondary | ICD-10-CM

## 2021-02-27 DIAGNOSIS — Z87891 Personal history of nicotine dependence: Secondary | ICD-10-CM

## 2021-02-27 DIAGNOSIS — F17210 Nicotine dependence, cigarettes, uncomplicated: Secondary | ICD-10-CM

## 2021-02-27 DIAGNOSIS — Z86010 Personal history of colonic polyps: Secondary | ICD-10-CM

## 2021-02-27 DIAGNOSIS — R1310 Dysphagia, unspecified: Secondary | ICD-10-CM | POA: Diagnosis not present

## 2021-02-27 DIAGNOSIS — J984 Other disorders of lung: Secondary | ICD-10-CM | POA: Diagnosis not present

## 2021-02-27 DIAGNOSIS — R12 Heartburn: Secondary | ICD-10-CM | POA: Diagnosis not present

## 2021-02-27 DIAGNOSIS — I2510 Atherosclerotic heart disease of native coronary artery without angina pectoris: Secondary | ICD-10-CM | POA: Diagnosis not present

## 2021-02-27 MED ORDER — ONDANSETRON HCL 4 MG PO TABS: ORAL_TABLET | ORAL | 0 refills | Status: DC

## 2021-02-27 MED ORDER — FAMOTIDINE 20 MG PO TABS: 20.0000 mg | ORAL_TABLET | Freq: Two times a day (BID) | ORAL | 3 refills | Status: DC

## 2021-02-27 MED ORDER — SUTAB 1479-225-188 MG PO TABS: ORAL_TABLET | ORAL | 0 refills | Status: DC

## 2021-02-27 NOTE — Patient Instructions (Addendum)
You have been scheduled for an endoscopy and colonoscopy. Please follow the written instructions given to you at your visit today. Please pick up your prep supplies at the pharmacy within the next 1-3 days. If you use inhalers (even only as needed), please bring them with you on the day of your procedure.   We have sent Pepcid to your pharmacy   We have Zofran for you to take 30 minutes before the start of your prep    Use Gaviscon three times a day after meals as needed  Take Colace 2 tablets daily  Increase dietary fiber    Increase water intake to 8-10 cups daily  Due to recent changes in healthcare laws, you may see the results of your imaging and laboratory studies on MyChart before your provider has had a chance to review them.  We understand that in some cases there may be results that are confusing or concerning to you. Not all laboratory results come back in the same time frame and the provider may be waiting for multiple results in order to interpret others.  Please give [place] 48 hours in order for your provider to thoroughly review all the results before contacting the office for clarification of your results.   Thank you for choosing [facility]  Karleen Hampshire Nandigam,MD

## 2021-02-27 NOTE — Progress Notes (Signed)
Cheryl Aguirre    [id number]    1946/07/13  Primary Care Physician:Tower, Wynelle Fanny, MD  Referring Physician: Tower, Wynelle Fanny, MD [address] [place],  [place] [address]   Chief complaint: Constipation, GERD  HPI:  75 year old very pleasant female here with complaints of progressive worsening of recent onset GERD symptoms and constipation.  She also wants to discuss surveillance colonoscopy Last office visit in 2017. She was diagnosed with breast cancer in 2019, s/p lumpectomy and radiation.  She is on tamoxifen She has dry mouth and sensitivity whenever she eats or drinks anything too hot or too cold. She is experiencing heartburn.  She was taking omeprazole previously but has since stopped because of potential side effects.  She is currently using Pepcid with persistent breakthrough heartburn No dysphagia or vomiting.  She has constant metallic acid taste in the mouth.    She is experiencing constipation with hard stool, is taking magnesium but has bowel movement once every other day on average.  Denies any rectal bleeding or melena.  Colonoscopy August 25, 2016 - One 9 mm polyp in the ascending colon, removed with a hot snare. Resected and retrieved. - Mucosal scar in the ascending. Biopsied. - Diverticulosis in the sigmoid colon. - Non-bleeding internal hemorrhoids.  Family history of colon cancer   Outpatient Encounter Medications as of 02/27/2021  Medication Sig  . acetaminophen (TYLENOL) 500 MG tablet Take 500 mg by mouth every 8 (eight) hours as needed.  . cetirizine (ZYRTEC) 10 MG tablet Take 10 mg by mouth daily.  . cyclobenzaprine (FLEXERIL) 10 MG tablet Take 1 tablet (10 mg total) by mouth at bedtime. (Patient taking differently: Take 10 mg by mouth daily as needed (migraine).)  . escitalopram (LEXAPRO) 20 MG tablet Take 1 tablet (20 mg total) by mouth daily.  . fish oil-omega-3 fatty acids 1000 MG capsule Take 1 g by mouth daily.  .  Lactobacillus (ACIDOPHILUS) 100 MG CAPS Take 1 capsule by mouth daily.  . Magnesium 250 MG TABS Take 1 tablet by mouth daily as needed (constipation).   . Melatonin 5 MG TABS Take 5 mg by mouth at bedtime. As directed at bedtime.  . montelukast (SINGULAIR) 10 MG tablet Take 1 tablet (10 mg total) by mouth at bedtime.  . Multiple Vitamin (MULTIVITAMIN) tablet Take 1 tablet by mouth daily.  . SUMAtriptan (IMITREX) 50 MG tablet Take 50 mg by mouth daily as needed. May repeat in 2 hours if headache persists or recurs.  . tamoxifen (NOLVADEX) 20 MG tablet Take 1 tablet (20 mg total) by mouth daily.  . Turmeric 500 MG CAPS Take 1 tablet by mouth daily.  . Vitamin D-Vitamin K (VITAMIN K2-VITAMIN D3 PO) Take 1 capsule by mouth daily.  . [DISCONTINUED] azithromycin (ZITHROMAX Z-PAK) 250 MG tablet Take 2 pills by mouth today and then 1 pill daily for 4 days   No facility-administered encounter medications on file as of 02/27/2021.    Allergies as of 02/27/2021 - Review Complete 02/27/2021  Allergen Reaction Noted  . Adhesive [tape]  03/04/2017  . Alendronate sodium    . Penicillins Other (See Comments) 02/11/2007  . Varenicline tartrate Nausea Only 02/11/2007  . Doxycycline Nausea And Vomiting and Rash 02/11/2007    Past Medical History:  Diagnosis Date  . Anxiety   . Arthritis    left hip  . Breast cancer ([place])   . Cancer ([place]) 2019   breast  .  Chronic headaches   . Colon polyps   . Depression   . Family history of breast cancer   . Family history of colon cancer   . Family history of pancreatic cancer   . Family history of thyroid cancer   . GERD (gastroesophageal reflux disease)   . Hypertension    elevated at times.  no meds  . IBS (irritable bowel syndrome)   . Insomnia   . Osteopenia   . Personal history of radiation therapy    2019  . Tobacco abuse     Past Surgical History:  Procedure Laterality Date  . ABDOMINAL HYSTERECTOMY     bleeding, fibroid, endometriosis  (total)  . BASAL CELL CARCINOMA EXCISION  11/22/2018  . BASAL CELL CARCINOMA EXCISION  07/12/2018  . BREAST LUMPECTOMY Right 01/2018  . BREAST LUMPECTOMY WITH RADIOACTIVE SEED AND SENTINEL LYMPH NODE BIOPSY Right 01/31/2018   Procedure: BREAST LUMPECTOMY WITH RADIOACTIVE SEED AND SENTINEL LYMPH NODE BIOPSY;  Surgeon: Jovita Kussmaul, MD;  Location: [facility];  Service: General;  Laterality: Right;  . BREAST SURGERY  2019  . CATARACT EXTRACTION W/PHACO Left 02/11/2017   Procedure: CATARACT EXTRACTION PHACO AND INTRAOCULAR LENS PLACEMENT (IOC);  Surgeon: Leandrew Koyanagi, MD;  Location: [facility];  Service: Ophthalmology;  Laterality: Left;  [place] 1:02.8 AP% 13.7 CDE 8.58 FLUID PACK LOT # [id number] H  . CATARACT EXTRACTION W/PHACO Right 03/10/2017   Procedure: CATARACT EXTRACTION PHACO AND INTRAOCULAR LENS PLACEMENT ([place]) Right symfony toric lens;  Surgeon: Leandrew Koyanagi, MD;  Location: [facility];  Service: Ophthalmology;  Laterality: Right;  symfony toric lens prefers early  . CT of chest  10/09   bronchiectasis  . ENDOMETRIAL BIOPSY    . LAPAROSCOPY  1984    Family History  Problem Relation Age of Onset  . Heart disease Other   . Cancer Mother        pancreatic, liver  . Cancer Father        colon  . Colon cancer Father 58  . Thyroid disease Sister   . Colon polyps Sister   . Irritable bowel syndrome Sister   . Breast cancer Sister 77  . Melanoma Sister 38  . Breast cancer Maternal Aunt        dx under 81  . Colon cancer Paternal Grandmother        dx in her 46s  . Thyroid cancer Paternal Grandmother        dx in her 20s  . Miscarriages / [place] Cousin   . Diabetes Cousin   . Breast cancer Cousin 78  . Thyroid disease Sister   . Skin cancer Brother   . Thyroid disease Sister   . Glaucoma Maternal Aunt     Social History   Socioeconomic History  . Marital status: Married    Spouse name: Not on file  . Number of children: 1  . Years of education: Not on  file  . Highest education level: Not on file  Occupational History  . Occupation: Merchandiser, retail: UNEMPLOYED  Tobacco Use  . Smoking status: Current Every Day Smoker    Packs/day: 1.25    Years: 56.00    Pack years: 70.00    Types: Cigarettes  . Smokeless tobacco: Never Used  Vaping Use  . Vaping Use: Never used  Substance and Sexual Activity  . Alcohol use: Not Currently  . Drug use: No  . Sexual activity: Yes  Other Topics Concern  . Not  on file  Social History Narrative   Daily Caffeine Use:  2 daily   Social Determinants of Health   Financial Resource Strain: Not on file  Food Insecurity: Not on file  Transportation Needs: Not on file  Physical Activity: Not on file  Stress: Not on file  Social Connections: Not on file  Intimate Partner Violence: Not on file      Review of systems: All other review of systems negative except as mentioned in the HPI.   Physical Exam: Vitals:   02/27/21 1000  BP: 118/78  Pulse: 79  SpO2: 96%   Body mass index is 19.51 kg/m. Gen:      No acute distress HEENT:  sclera anicteric Abd:      soft, mild distention with generalized discomfort  Ext:    No edema Neuro: alert and oriented x 3 Psych: normal mood and affect  Data Reviewed:  Reviewed labs, radiology imaging, old records and pertinent past GI work up   Assessment and Plan/Recommendations:  75 year old very pleasant female with history of breast cancer with recent onset worsening GERD symptoms, constipation, personal history of colon polyps and family history of colon cancer  Heartburn and odynophagia could be secondary to uncontrolled acid reflux versus radiation induced esophagitis: Continue Pepcid twice daily as needed and add Gaviscon after meals up to 3 times daily as needed Antireflux measures EGD for further evaluation, exclude erosive esophagitis or peptic ulcer The risks and benefits as well as alternatives of endoscopic procedure(s) have been  discussed and reviewed. All questions answered. The patient agrees to proceed. Further recommendations based on EGD findings  Chronic idiopathic constipation: Increase dietary fiber and water intake to 8 to 10 cups daily Add Colace 1 to 2 tablets daily If continues to have persistent constipation will consider prescription low-dose laxative  Personal history of adenomatous colon polyps and family history of colon cancer, is due for surveillance colonoscopy.  We will schedule colonoscopy along with EGD.   The patient was provided an opportunity to ask questions and all were answered. The patient agreed with the plan and demonstrated an understanding of the instructions.  Damaris Hippo , MD    CC: Tower, Wynelle Fanny, MD

## 2021-03-13 ENCOUNTER — Other Ambulatory Visit: Payer: Self-pay | Admitting: *Deleted

## 2021-03-13 DIAGNOSIS — F17210 Nicotine dependence, cigarettes, uncomplicated: Secondary | ICD-10-CM

## 2021-03-13 DIAGNOSIS — Z87891 Personal history of nicotine dependence: Secondary | ICD-10-CM

## 2021-03-13 NOTE — Progress Notes (Signed)
Please call patient and let them  know their  low dose Ct was read as a Lung RADS 2: nodules that are benign in appearance and behavior with a very low likelihood of becoming a clinically active cancer due to size or lack of growth. Recommendation per radiology is for a repeat LDCT in 12 months. .Please let them  know we will order and schedule their  annual screening scan for 02/2022. Please let them  know there was notation of CAD on their  scan.  Please remind the patient  that this is a non-gated exam therefore degree or severity of disease  cannot be determined. Please have them  follow up with their PCP regarding potential risk factor modification, dietary therapy or pharmacologic therapy if clinically indicated. Pt.  is not  currently on statin therapy. Please place order for annual  screening scan for  02/2022 and fax results to PCP. Thanks so much.  Langley Gauss, please let patient know there was a finding of bronchiectasis, and airway impaction, which is a chronic inflammation/ infection of the airways. Please see if she would like referral for a consult with a pulmonologist, as this is one of the many chronic illnesses we manage. Let her know I do recommend this.  Thanks so much.

## 2021-03-24 ENCOUNTER — Ambulatory Visit (INDEPENDENT_AMBULATORY_CARE_PROVIDER_SITE_OTHER): Payer: Medicare Other | Admitting: Internal Medicine

## 2021-03-24 ENCOUNTER — Encounter: Payer: Self-pay | Admitting: Internal Medicine

## 2021-03-24 ENCOUNTER — Other Ambulatory Visit: Payer: Self-pay

## 2021-03-24 VITALS — BP 124/68 | HR 81 | Ht 63.0 in | Wt 108.0 lb

## 2021-03-24 DIAGNOSIS — E038 Other specified hypothyroidism: Secondary | ICD-10-CM

## 2021-03-24 MED ORDER — LEVOTHYROXINE SODIUM 25 MCG PO TABS: 25.0000 ug | ORAL_TABLET | Freq: Every day | ORAL | 1 refills | Status: DC

## 2021-03-24 NOTE — Progress Notes (Signed)
Name: Cheryl Aguirre  MRN/ DOB: [id number], August 29, 1946    Age/ Sex: 75 y.o., female    PCP: Abner Greenspan, MD   Reason for Endocrinology Evaluation: Subclinical hypothyroidism     Date of Initial Endocrinology Evaluation: 03/24/2021     HPI: Cheryl Aguirre is a 75 y.o. female with a past medical history of Hx of Breast Ca ( S/P lumpectomy 2019). The patient presented for initial endocrinology clinic visit on 03/24/2021 for consultative assistance with her Subclinical Hypothyroidism .   She has been noted with slight and intermittent TSH elevation since 12/2019 with normal FT4.     Of note, has 3 sisters with thyroid disease    She has been noted with weight loss  Denies diarrhea but has constipation  Has occasional palpitations  No local neck symptoms except for intermittent lymphadenopathy   No energy  , sleeps well  Has "chemo hair "   Had radiation for breast Ca        HISTORY:  Past Medical History:  Past Medical History:  Diagnosis Date   Anxiety    Arthritis    left hip   Breast cancer ([place])    Cancer ([place]) 2019   breast   Chronic headaches    Colon polyps    Depression    Family history of breast cancer    Family history of colon cancer    Family history of pancreatic cancer    Family history of thyroid cancer    GERD (gastroesophageal reflux disease)    Hypertension    elevated at times.  no meds   IBS (irritable bowel syndrome)    Insomnia    Osteopenia    Personal history of radiation therapy    2019   Tobacco abuse    Past Surgical History:  Past Surgical History:  Procedure Laterality Date   ABDOMINAL HYSTERECTOMY     bleeding, fibroid, endometriosis (total)   BASAL CELL CARCINOMA EXCISION  11/22/2018   BASAL CELL CARCINOMA EXCISION  07/12/2018   BREAST LUMPECTOMY Right 01/2018   BREAST LUMPECTOMY WITH RADIOACTIVE SEED AND SENTINEL LYMPH NODE BIOPSY Right 01/31/2018   Procedure: BREAST LUMPECTOMY WITH RADIOACTIVE  SEED AND SENTINEL LYMPH NODE BIOPSY;  Surgeon: Jovita Kussmaul, MD;  Location: [facility];  Service: General;  Laterality: Right;   BREAST SURGERY  2019   CATARACT EXTRACTION W/PHACO Left 02/11/2017   Procedure: CATARACT EXTRACTION PHACO AND INTRAOCULAR LENS PLACEMENT ([place]);  Surgeon: Leandrew Koyanagi, MD;  Location: [facility];  Service: Ophthalmology;  Laterality: Left;  [place] 1:02.8 AP% 13.7 CDE 8.58 FLUID PACK LOT # [id number] H   CATARACT EXTRACTION W/PHACO Right 03/10/2017   Procedure: CATARACT EXTRACTION PHACO AND INTRAOCULAR LENS PLACEMENT ([facility]) Right symfony toric lens;  Surgeon: Leandrew Koyanagi, MD;  Location: [facility];  Service: Ophthalmology;  Laterality: Right;  symfony toric lens prefers early   CT of chest  10/09   bronchiectasis   ENDOMETRIAL BIOPSY     LAPAROSCOPY  1984    Social History:  reports that she has been smoking cigarettes. She has a 56.00 pack-year smoking history. She has never used smokeless tobacco. She reports previous alcohol use. She reports that she does not use drugs. Family History: family history includes Breast cancer in her maternal aunt; Breast cancer (age of onset: 89) in her cousin; Breast cancer (age of onset: 53) in her sister; Cancer in her father and mother; Colon cancer in her paternal grandmother; Colon cancer (age  of onset: 59) in her father; Colon polyps in her sister; Diabetes in her cousin; Glaucoma in her maternal aunt; Heart disease in an other family member; Irritable bowel syndrome in her sister; Melanoma (age of onset: 70) in her sister; Miscarriages / Stillbirths in her cousin; Skin cancer in her brother; Thyroid cancer in her paternal grandmother; Thyroid disease in her sister, sister, and sister.   HOME MEDICATIONS: Allergies as of 03/24/2021       Reactions   Adhesive [tape]    Tears skin   Alendronate Sodium    REACTION: muscle, joint pain, indigestion   Penicillins Other (See Comments)   Has patient had a PCN reaction  causing immediate rash, facial/tongue/throat swelling, SOB or lightheadedness with hypotension: yes Has patient had a PCN reaction causing severe rash involving mucus membranes or skin necrosis: no Has patient had a PCN reaction that required hospitalization: no Has patient had a PCN reaction occurring within the last 10 years: no If all of the above answers are "NO", then may proceed with Cephalosporin use. **CHILDHOOD**   Varenicline Tartrate Nausea Only       Doxycycline Nausea And Vomiting, Rash            Medication List        Accurate as of March 24, 2021  3:49 PM. If you have any questions, ask your nurse or doctor.          acetaminophen 500 MG tablet Commonly known as: TYLENOL Take 500 mg by mouth every 8 (eight) hours as needed.   Acidophilus 100 MG Caps Take 1 capsule by mouth daily.   calcium carbonate 500 MG chewable tablet Commonly known as: TUMS - dosed in mg elemental calcium Chew 1 tablet by mouth as needed for indigestion or heartburn.   cetirizine 10 MG tablet Commonly known as: ZYRTEC Take 10 mg by mouth daily.   cyclobenzaprine 10 MG tablet Commonly known as: FLEXERIL Take 1 tablet (10 mg total) by mouth at bedtime. What changed:  when to take this reasons to take this   escitalopram 20 MG tablet Commonly known as: LEXAPRO Take 1 tablet (20 mg total) by mouth daily.   famotidine 20 MG tablet Commonly known as: Pepcid Take 1 tablet (20 mg total) by mouth 2 (two) times daily.   fish oil-omega-3 fatty acids 1000 MG capsule Take 1 g by mouth daily.   levothyroxine 25 MCG tablet Commonly known as: SYNTHROID Take 1 tablet (25 mcg total) by mouth daily before breakfast. Started by: Dorita Sciara, MD   Magnesium 250 MG Tabs Take 1 tablet by mouth daily as needed (constipation).   melatonin 5 MG Tabs Take 5 mg by mouth at bedtime. As directed at bedtime.   montelukast 10 MG tablet Commonly known as: SINGULAIR Take 1 tablet (10  mg total) by mouth at bedtime.   multivitamin tablet Take 1 tablet by mouth daily.   ondansetron 4 MG tablet Commonly known as: ZOFRAN Use as directed with colonoscopy prep   SUMAtriptan 50 MG tablet Commonly known as: IMITREX Take 50 mg by mouth daily as needed. May repeat in 2 hours if headache persists or recurs.   Sutab [phone] MG Tabs Generic drug: Sodium Sulfate-Mag Sulfate-KCl Use 1 kit per colon prep   tamoxifen 20 MG tablet Commonly known as: NOLVADEX Take 1 tablet (20 mg total) by mouth daily.   Turmeric 500 MG Caps Take 1 tablet by mouth daily.   VITAMIN K2-VITAMIN D3 PO Take 1  capsule by mouth daily.          REVIEW OF SYSTEMS: A comprehensive ROS was conducted with the patient and is negative except as per HPI     OBJECTIVE:  VS: BP 124/68   Pulse 81   Ht 5' 3"  (1.6 m)   Wt 108 lb (49 kg)   SpO2 95%   BMI 19.13 kg/m    Wt Readings from Last 3 Encounters:  03/24/21 108 lb (49 kg)  02/27/21 107 lb (48.5 kg)  01/08/21 107 lb 4 oz (48.6 kg)     EXAM: General: Pt appears well and is in NAD  Neck: General: Supple without adenopathy. Thyroid: Thyroid size normal.  No goiter or nodules appreciated.   Lungs: Clear with good BS bilat with no rales, rhonchi, or wheezes  Heart: Auscultation: RRR.  Abdomen: Normoactive bowel sounds, soft, nontender, without masses or organomegaly palpable  Extremities:  BL LE: No pretibial edema normal ROM and strength.  Skin: Hair: Texture and amount normal with gender appropriate distribution Skin Inspection: No rashes, Skin Palpation: Skin temperature, texture, and thickness normal to palpation  Mental Status: Judgment, insight: Intact Orientation: Oriented to time, place, and person Mood and affect: No depression, anxiety, or agitation     DATA REVIEWED: Results for MARKELL, SCHRIER "PAM" (MRN [id number]) as of 03/24/2021 15:49  Ref. Range 12/13/2019 12:04 07/03/2020 14:04 01/06/2021 11:00  TSH Latest  Ref Range: 0.35 - 4.50 uIU/mL 4.59 (H) 2.95 5.49 (H)  T4,Free(Direct) Latest Ref Range: 0.60 - 1.60 ng/dL  0.67 0.76      ASSESSMENT/PLAN/RECOMMENDATIONS:   Subclinical Hypothyroidism:  -Patient with fatigue and hair changes, we discussed levels being slightly elevated and LT-4 replacement is optional at this time  - She has a strong FH of thyroid disease, will check for TPO Ab's on next lab - Pt educated extensively on the correct way to take levothyroxine (first thing in the morning with water, 30 minutes before eating or taking other medications). - Pt encouraged to double dose the following day if she were to miss a dose given long half-life of levothyroxine.   Medications : Start Levothyroxine 25 mcg daily   F/U in 3 months  Lab in    Signed electronically by: Mack Guise, MD  [facility]  [facility] [address] [place], [place] [address] Phone: [phone] FAX: [phone]   CC: Tower, Wynelle Fanny, MD [place] [address] Phone: [phone] Fax: [phone]   Return to Endocrinology clinic as below: Future Appointments  Date Time Provider [place]  04/04/2021  3:30 PM Hunsucker, Bonna Gains, MD [facility] None  05/05/2021  1:45 PM [facility] None  05/15/2021 11:00 AM Mauri Pole, MD [facility]  06/25/2021  1:40 PM Cherie Lasalle, Melanie Crazier, MD [facility] None  12/29/2021  3:30 PM Nicholas Lose, MD [facility] None

## 2021-03-24 NOTE — Patient Instructions (Signed)

## 2021-04-04 ENCOUNTER — Encounter: Payer: Self-pay | Admitting: Pulmonary Disease

## 2021-04-04 ENCOUNTER — Other Ambulatory Visit: Payer: Self-pay | Admitting: Pulmonary Disease

## 2021-04-04 ENCOUNTER — Ambulatory Visit (INDEPENDENT_AMBULATORY_CARE_PROVIDER_SITE_OTHER): Payer: Medicare Other | Admitting: Pulmonary Disease

## 2021-04-04 ENCOUNTER — Other Ambulatory Visit: Payer: Self-pay

## 2021-04-04 VITALS — BP 110/68 | HR 79 | Ht 63.0 in | Wt 106.8 lb

## 2021-04-04 DIAGNOSIS — J479 Bronchiectasis, uncomplicated: Secondary | ICD-10-CM | POA: Diagnosis not present

## 2021-04-04 MED ORDER — SODIUM CHLORIDE 3 % IN NEBU: INHALATION_SOLUTION | RESPIRATORY_TRACT | 12 refills | Status: DC | PRN

## 2021-04-04 NOTE — Patient Instructions (Signed)
Nice to meet you  We will get blood work today to try to figure out why this condition has occurred  We will hopefully send the sputum out to look for bacteria and other bugs that could cause or contribute to the bronchiectasis and ongoing symptoms that we may need to treat  We will get pulmonary function or breathing test when you return to clinic at next visit  We will start nebulized saline twice a day.  We will provide an order for a nebulizer machine to be sent to you.  I recommend using the flutter valve twice a day as well we will order this and sent to you.  Return to clinic in 3 months or sooner as needed

## 2021-04-04 NOTE — Progress Notes (Signed)
_0  ID: Cheryl Aguirre, female    DOB: 04/13/1946, 75 y.o.   MRN: [id number]  Chief Complaint  Patient presents with   Consult    Coughing worse at night when lying down    Referring provider: Tower, Wynelle Fanny, MD  HPI:   75 year old whom we are seeing in consultation for evaluation of bronchiectasis.  Most recent endocrinology note reviewed.  Most recent GI note reviewed.  Most recent PCP note reviewed.  Patient notes a remarkably long history of recurrent upper respiratory illnesses.  Bouts of bronchitis, pneumonia etc.  Recalls a time as a child but was almost admitted to the hospital.  Again is occurred throughout her life.  Has productive cough daily.  Seems worse over the last several months.  Received Z-Pak x2 with mild improvement in symptoms on the temporary basis.  No time during day with things are better or worse.  No seasonal or environmental factors she can identify.  No other alleviating or exacerbating factors she can identify.  Reviewed extensively past CT scans most remote 2009 of the monitor patient reveals most prominently in the lingula and right middle lobe bronchiectatic changes with mild scarring likely the postinflammatory condition/pneumonia but scattered more mild bronchiectasis in the upper lobes and lower lobes bilaterally on my interpretation.  Reviewed lung cancer CT scan 11/13/2019 and 1 more recently 2022 that shows progression of diffuse bilateral bronchiectasis most prominent in the middle lobe and lingula and lower lobes but significant in the upper lobes as well with scattered mucus impaction on my interpretation.  No history of diabetes.  No history of recurrent diarrhea.  She does have sinus disease or sinus congestion.  History of seasonal allergies although not super severe.  More deep congestion, in the sinuses the posterior rhinorrhea, postnasal drip.  PMH: Hypothyroidism, GERD, breast cancer currently on tamoxifen therapy Surgical history:  Hysterectomy, breast surgery 2019 Family history: Cancer runs in family, no first-degree relatives with respiratory illnesses Social history: Current everyday smoker, 1 pack a day, 60-pack-year history, lives in [place] / Pulmonary Flowsheets:   ACT:  No flowsheet data found.  MMRC: No flowsheet data found.  Epworth:  No flowsheet data found.  Tests:   FENO:  No results found for: NITRICOXIDE  PFT: No flowsheet data found.  WALK:  No flowsheet data found.  Imaging: Personally reviewed and as per EMR  Lab Results: Personally reviewed CBC    Component Value Date/Time   WBC 6.6 01/06/2021 1100   RBC 4.25 01/06/2021 1100   HGB 13.9 01/06/2021 1100   HCT 41.0 01/06/2021 1100   PLT 254.0 01/06/2021 1100   MCV 96.4 01/06/2021 1100   MCH 31.5 01/21/2018 1012   MCHC 33.9 01/06/2021 1100   RDW 13.4 01/06/2021 1100   LYMPHSABS 2.0 01/06/2021 1100   MONOABS 0.8 01/06/2021 1100   EOSABS 0.2 01/06/2021 1100   BASOSABS 0.1 01/06/2021 1100    BMET    Component Value Date/Time   NA 139 01/06/2021 1100   K 4.2 01/06/2021 1100   CL 103 01/06/2021 1100   CO2 29 01/06/2021 1100   GLUCOSE 103 (H) 01/06/2021 1100   BUN 13 01/06/2021 1100   CREATININE 0.79 01/06/2021 1100   CALCIUM 9.2 01/06/2021 1100   GFRNONAA >60 01/21/2018 1012   GFRAA >60 01/21/2018 1012    BNP No results found for: BNP  ProBNP No results found for: PROBNP  Specialty Problems       Pulmonary Problems  Acute sinusitis    Qualifier: Diagnosis of  By: Copland MD, Spencer         Allergic rhinitis    Allergies  Allergen Reactions   Adhesive [Tape]     Tears skin   Alendronate Sodium     REACTION: muscle, joint pain, indigestion   Penicillins Other (See Comments)    Has patient had a PCN reaction causing immediate rash, facial/tongue/throat swelling, SOB or lightheadedness with hypotension: yes Has patient had a PCN reaction causing severe rash involving mucus  membranes or skin necrosis: no Has patient had a PCN reaction that required hospitalization: no Has patient had a PCN reaction occurring within the last 10 years: no If all of the above answers are "NO", then may proceed with Cephalosporin use. **CHILDHOOD**   Varenicline Tartrate Nausea Only        Doxycycline Nausea And Vomiting and Rash         Immunization History  Administered Date(s) Administered   Influenza Split 07/27/2011, 07/12/2013   Influenza Whole 08/08/2002, 07/20/2007   Influenza, High Dose Seasonal PF 08/01/2015, 07/12/2017, 07/22/2018, 07/17/2019, 07/09/2020   Influenza, Seasonal, Injecte, Preservative Fre 08/04/2016   Influenza-Unspecified 07/13/2014   PFIZER(Purple Top)SARS-COV-2 Vaccination 01/04/2020, 01/25/2020   Pneumococcal Conjugate-13 11/19/2014   Pneumococcal Polysaccharide-23 05/01/2005, 11/01/2012   Td 03/13/1999, 07/24/2010   Zoster, Live 08/12/2014    Past Medical History:  Diagnosis Date   Anxiety    Arthritis    left hip   Breast cancer ([place])    Cancer ([place]) 2019   breast   Chronic headaches    Colon polyps    Depression    Family history of breast cancer    Family history of colon cancer    Family history of pancreatic cancer    Family history of thyroid cancer    GERD (gastroesophageal reflux disease)    Hypertension    elevated at times.  no meds   IBS (irritable bowel syndrome)    Insomnia    Osteopenia    Personal history of radiation therapy    2019   Tobacco abuse     Tobacco History: Social History   Tobacco Use  Smoking Status Every Day   Packs/day: 1.00   Years: 56.00   Pack years: 56.00   Types: Cigarettes  Smokeless Tobacco Never   Ready to quit: Not Answered Counseling given: Not Answered   Continue to not smoke  Outpatient Encounter Medications as of 04/04/2021  Medication Sig   acetaminophen (TYLENOL) 500 MG tablet Take 500 mg by mouth every 8 (eight) hours as needed.   calcium carbonate (TUMS -  DOSED IN MG ELEMENTAL CALCIUM) 500 MG chewable tablet Chew 1 tablet by mouth as needed for indigestion or heartburn.   cetirizine (ZYRTEC) 10 MG tablet Take 10 mg by mouth daily.   cyclobenzaprine (FLEXERIL) 10 MG tablet Take 1 tablet (10 mg total) by mouth at bedtime. (Patient taking differently: Take 10 mg by mouth daily as needed (migraine).)   escitalopram (LEXAPRO) 20 MG tablet Take 1 tablet (20 mg total) by mouth daily.   famotidine (PEPCID) 20 MG tablet Take 1 tablet (20 mg total) by mouth 2 (two) times daily.   fish oil-omega-3 fatty acids 1000 MG capsule Take 1 g by mouth daily.   Lactobacillus (ACIDOPHILUS) 100 MG CAPS Take 1 capsule by mouth daily.   levothyroxine (SYNTHROID) 25 MCG tablet Take 1 tablet (25 mcg total) by mouth daily before breakfast.   Magnesium 250 MG TABS  Take 1 tablet by mouth daily as needed (constipation).    Melatonin 5 MG TABS Take 5 mg by mouth at bedtime. As directed at bedtime.   montelukast (SINGULAIR) 10 MG tablet Take 1 tablet (10 mg total) by mouth at bedtime.   Multiple Vitamin (MULTIVITAMIN) tablet Take 1 tablet by mouth daily.   ondansetron (ZOFRAN) 4 MG tablet Use as directed with colonoscopy prep   sodium chloride HYPERTONIC 3 % nebulizer solution Take by nebulization as needed for other.   Sodium Sulfate-Mag Sulfate-KCl (SUTAB) [phone] MG TABS Use 1 kit per colon prep   SUMAtriptan (IMITREX) 50 MG tablet Take 50 mg by mouth daily as needed. May repeat in 2 hours if headache persists or recurs.   tamoxifen (NOLVADEX) 20 MG tablet Take 1 tablet (20 mg total) by mouth daily.   Turmeric 500 MG CAPS Take 1 tablet by mouth daily.   Vitamin D-Vitamin K (VITAMIN K2-VITAMIN D3 PO) Take 1 capsule by mouth daily.   No facility-administered encounter medications on file as of 04/04/2021.     Review of Systems  Review of Systems  No chest pain with exertion.  No orthopnea or PND.  No lower extremity swelling.  Comprehensive review of systems otherwise  negative. Physical Exam  BP 110/68   Pulse 79   Ht _0  (1.6 m)   Wt 106 lb 12.8 oz (48.4 kg)   SpO2 95%   BMI 18.92 kg/m   Wt Readings from Last 5 Encounters:  04/04/21 106 lb 12.8 oz (48.4 kg)  03/24/21 108 lb (49 kg)  02/27/21 107 lb (48.5 kg)  01/08/21 107 lb 4 oz (48.6 kg)  12/27/20 108 lb 8 oz (49.2 kg)    BMI Readings from Last 5 Encounters:  04/04/21 18.92 kg/m  03/24/21 19.13 kg/m  02/27/21 19.51 kg/m  01/08/21 19.55 kg/m  12/27/20 19.22 kg/m     Physical Exam General: Well-appearing, in no acute distress Eyes: EOMI, no icterus Neck: Supple, no JVP appreciated Cardiovascular: Regular rate and rhythm, no murmur Pulmonary: Clear to auscultation bilaterally, normal work of breathing, diminished in the bases Abdomen: Nondistended, bowel sounds present MSK: No synovitis, no joint effusion Neuro: Normal gait, no weakness Psych: Normal mood, full affect   Assessment & Plan:   Bronchiectasis: Present since at least 2009 as seen on CT scan.  Progressively worsening over time on serial images.  Suspect multifactorial related to high suspicion of NTM disease given the symptoms as well as more prominent lingula and right middle lobe pathology even back in 2009, chronic aspiration given her description of difficulty swallowing, related to recurrent infections given her description of multiple pneumonias bronchitis etc. from childhood on.  Low suspicion for PCD given no issues with infertility.  Given recurrent infections, work-up for immunodeficiency with immunoglobulins.  Alpha-1 antitrypsin also ordered.  Lastly, CF mutation panel ordered although this is felt less likely given the mild disease and predominant lingular and middle lobe disease seen in 2009.  Will obtain sputum cultures, bacterial and AFB today.  Weight loss, fatigue, night sweats: Confounded by breast cancer treatment, swallowing issues, cancer therapy as seem to all contribute. Possibly related to NTM  disease. AFB as above.   Return in about 3 months (around 07/05/2021).   Lanier Clam, MD 04/04/2021   This appointment required 85 minutes of patient care (this includes precharting, chart review, review of results, face-to-face care, etc.).

## 2021-04-08 ENCOUNTER — Telehealth: Payer: Self-pay | Admitting: Pulmonary Disease

## 2021-04-08 NOTE — Telephone Encounter (Signed)
ATC [facility] was on hold for 10 mns. Will attempt to call again tomorrow.

## 2021-04-10 LAB — CYSTIC FIBROSIS MUTATION 97: Interpretation: NOT DETECTED

## 2021-04-10 LAB — SPECIMEN STATUS REPORT

## 2021-04-10 LAB — IGG, IGA, IGM
IgA/Immunoglobulin A, Serum: 229 mg/dL (ref 64–422)
IgG (Immunoglobin G), Serum: 1189 mg/dL (ref 586–1602)
IgM (Immunoglobulin M), Srm: 84 mg/dL (ref 26–217)

## 2021-04-10 LAB — ALPHA-1-ANTITRYPSIN: A-1 Antitrypsin: 220 mg/dL — ABNORMAL HIGH (ref 101–187)

## 2021-04-10 NOTE — Telephone Encounter (Signed)
Cheryl Aguirre, please advise if you have seen a fax on this pt from [facility]. If not, triage can call back to get re-faxed. Thanks.

## 2021-04-10 NOTE — Telephone Encounter (Signed)
I have not received any paperwork from [facility] for this patient. Will call [facility] to see what is needed.

## 2021-04-15 DIAGNOSIS — Z20822 Contact with and (suspected) exposure to covid-19: Secondary | ICD-10-CM | POA: Diagnosis not present

## 2021-04-15 NOTE — Telephone Encounter (Addendum)
Called and spoke with Otila Kluver with [facility]. She states the Alpha 1 antitrypsin phenotype drawn on 04/04/21 could not be processed due to the shortage of raw material/supplies.   Will forward to Dr. Silas Flood as Juluis Rainier.

## 2021-04-16 ENCOUNTER — Telehealth: Payer: Self-pay | Admitting: Pulmonary Disease

## 2021-04-16 NOTE — Telephone Encounter (Signed)
disregard

## 2021-05-03 LAB — RESULT

## 2021-05-03 LAB — AFB CULTURE WITH SMEAR (NOT AT ARMC)
Acid Fast Culture: POSITIVE — AB
Acid Fast Smear: NEGATIVE

## 2021-05-03 LAB — AFB ID BY DNA PROBE
M avium complex: NEGATIVE
M gordonae: POSITIVE — AB
M tuberculosis complex: NEGATIVE

## 2021-05-03 LAB — LOWER RESPIRATORY CULTURE

## 2021-05-05 ENCOUNTER — Other Ambulatory Visit (INDEPENDENT_AMBULATORY_CARE_PROVIDER_SITE_OTHER): Payer: Medicare Other

## 2021-05-05 ENCOUNTER — Other Ambulatory Visit: Payer: Self-pay

## 2021-05-05 DIAGNOSIS — E038 Other specified hypothyroidism: Secondary | ICD-10-CM | POA: Diagnosis not present

## 2021-05-05 LAB — TSH: TSH: 2.31 u[IU]/mL (ref 0.35–5.50)

## 2021-05-06 LAB — THYROID PEROXIDASE ANTIBODY: Thyroperoxidase Ab SerPl-aCnc: 1 IU/mL (ref <9)

## 2021-05-15 ENCOUNTER — Other Ambulatory Visit: Payer: Self-pay

## 2021-05-15 ENCOUNTER — Ambulatory Visit (AMBULATORY_SURGERY_CENTER): Payer: Medicare Other | Admitting: Gastroenterology

## 2021-05-15 ENCOUNTER — Encounter: Payer: Self-pay | Admitting: Gastroenterology

## 2021-05-15 VITALS — BP 115/51 | HR 77 | Temp 98.9°F | Resp 15 | Ht 62.0 in | Wt 107.0 lb

## 2021-05-15 DIAGNOSIS — K2990 Gastroduodenitis, unspecified, without bleeding: Secondary | ICD-10-CM

## 2021-05-15 DIAGNOSIS — Z86010 Personal history of colonic polyps: Secondary | ICD-10-CM

## 2021-05-15 DIAGNOSIS — K2950 Unspecified chronic gastritis without bleeding: Secondary | ICD-10-CM | POA: Diagnosis not present

## 2021-05-15 DIAGNOSIS — K5909 Other constipation: Secondary | ICD-10-CM | POA: Diagnosis not present

## 2021-05-15 DIAGNOSIS — K2970 Gastritis, unspecified, without bleeding: Secondary | ICD-10-CM

## 2021-05-15 DIAGNOSIS — K2100 Gastro-esophageal reflux disease with esophagitis, without bleeding: Secondary | ICD-10-CM | POA: Diagnosis not present

## 2021-05-15 DIAGNOSIS — Z800 Family history of malignant neoplasm of digestive organs: Secondary | ICD-10-CM

## 2021-05-15 DIAGNOSIS — K219 Gastro-esophageal reflux disease without esophagitis: Secondary | ICD-10-CM

## 2021-05-15 DIAGNOSIS — Z720 Tobacco use: Secondary | ICD-10-CM | POA: Diagnosis not present

## 2021-05-15 DIAGNOSIS — Z853 Personal history of malignant neoplasm of breast: Secondary | ICD-10-CM | POA: Diagnosis not present

## 2021-05-15 MED ORDER — PANTOPRAZOLE SODIUM 40 MG PO TBEC: 40.0000 mg | DELAYED_RELEASE_TABLET | Freq: Every day | ORAL | 3 refills | Status: DC

## 2021-05-15 MED ORDER — SODIUM CHLORIDE 0.9 % IV SOLN
500.0000 mL | Freq: Once | INTRAVENOUS | Status: DC
Start: 2021-05-15 — End: 2024-02-01

## 2021-05-15 NOTE — Progress Notes (Signed)
History reviewed today 

## 2021-05-15 NOTE — Op Note (Signed)
[facility] Patient Name: Cheryl Aguirre Procedure Date: 05/15/2021 11:21 AM MRN: [id number] Endoscopist: Mauri Pole , MD Age: 75 Referring MD:  Date of Birth: November 15, 1945 Gender: Female Account #: [id number] Procedure:                Upper GI endoscopy Indications:              Dyspepsia, Suspected esophageal reflux Medicines:                Monitored Anesthesia Care Procedure:                Pre-Anesthesia Assessment:                           - Prior to the procedure, a History and Physical                            was performed, and patient medications and                            allergies were reviewed. The patient's tolerance of                            previous anesthesia was also reviewed. The risks                            and benefits of the procedure and the sedation                            options and risks were discussed with the patient.                            All questions were answered, and informed consent                            was obtained. Prior Anticoagulants: The patient has                            taken no previous anticoagulant or antiplatelet                            agents. ASA Grade Assessment: II - A patient with                            mild systemic disease. After reviewing the risks                            and benefits, the patient was deemed in                            satisfactory condition to undergo the procedure.                           After obtaining informed consent, the endoscope was  passed under direct vision. Throughout the                            procedure, the patient's blood pressure, pulse, and                            oxygen saturations were monitored continuously. The                            GIF [id number] was introduced through the                            mouth, and advanced to the second part of duodenum.                            The upper  GI endoscopy was accomplished without                            difficulty. The patient tolerated the procedure                            well. Scope In: Scope Out: Findings:                 LA Grade B (one or more mucosal breaks greater than                            5 mm, not extending between the tops of two mucosal                            folds) esophagitis with no bleeding was found 34 to                            35 cm from the incisors.                           The Z-line was regular and was found 35 cm from the                            incisors.                           Patchy moderate inflammation characterized by                            congestion (edema), erosions and erythema was found                            in the entire examined stomach. Biopsies were taken                            with a cold forceps for Helicobacter pylori testing.  The cardia and gastric fundus were normal on                            retroflexion.                           The examined duodenum was normal. Complications:            No immediate complications. Estimated Blood Loss:     Estimated blood loss was minimal. Impression:               - LA Grade B reflux esophagitis with no bleeding.                           - Z-line regular, 35 cm from the incisors.                           - Gastritis. Biopsied.                           - Normal examined duodenum. Recommendation:           - Resume previous diet.                           - Continue present medications.                           - Await pathology results.                           - Follow an antireflux regimen.                           - Use Protonix (pantoprazole) 40 mg PO daily, 30                            minutes before lunch for 3 months.                           - See the other procedure note for documentation of                            additional recommendations. Mauri Pole, MD 05/15/2021 11:53:13 AM This report has been signed electronically.

## 2021-05-15 NOTE — Patient Instructions (Signed)
YOU HAD AN ENDOSCOPIC PROCEDURE TODAY AT [facility]:   Refer to the procedure report that was given to you for any specific questions about what was found during the examination.  If the procedure report does not answer your questions, please call your gastroenterologist to clarify.  If you requested that your care partner not be given the details of your procedure findings, then the procedure report has been included in a sealed envelope for you to review at your convenience later.  YOU SHOULD EXPECT: Some feelings of bloating in the abdomen. Passage of more gas than usual.  Walking can help get rid of the air that was put into your GI tract during the procedure and reduce the bloating. If you had a lower endoscopy (such as a colonoscopy or flexible sigmoidoscopy) you may notice spotting of blood in your stool or on the toilet paper. If you underwent a bowel prep for your procedure, you may not have a normal bowel movement for a few days.  Please Note:  You might notice some irritation and congestion in your nose or some drainage.  This is from the oxygen used during your procedure.  There is no need for concern and it should clear up in a day or so.  SYMPTOMS TO REPORT IMMEDIATELY:  Following lower endoscopy (colonoscopy or flexible sigmoidoscopy):  Excessive amounts of blood in the stool  Significant tenderness or worsening of abdominal pains  Swelling of the abdomen that is new, acute  Fever of 100F or higher  Following upper endoscopy (EGD)  Vomiting of blood or coffee ground material  New chest pain or pain under the shoulder blades  Painful or persistently difficult swallowing  New shortness of breath  Fever of 100F or higher  Black, tarry-looking stools  For urgent or emergent issues, a gastroenterologist can be reached at any hour by calling [phone]. Do not use MyChart messaging for urgent concerns.    DIET:  We do recommend a small meal at first, but  then you may proceed to your regular diet.  Drink plenty of fluids but you should avoid alcoholic beverages for 24 hours.  MEDICATIONS: Continue present medications. Use Protonix (pantoprazole) 40 mg by mouth daily, use 30 minutes before lunch for 3 months.  Please see handouts given to you by your recovery nurse.  Thank you for allowing [place] to provide for your healthcare needs today.  ACTIVITY:  You should plan to take it easy for the rest of today and you should NOT DRIVE or use heavy machinery until tomorrow (because of the sedation medicines used during the test).    FOLLOW UP: Our staff will call the number listed on your records 48-72 hours following your procedure to check on you and address any questions or concerns that you may have regarding the information given to you following your procedure. If we do not reach you, we will leave a message.  We will attempt to reach you two times.  During this call, we will ask if you have developed any symptoms of COVID 19. If you develop any symptoms (ie: fever, flu-like symptoms, shortness of breath, cough etc.) before then, please call [phone].  If you test positive for Covid 19 in the 2 weeks post procedure, please call and report this information to [place].    If any biopsies were taken you will be contacted by phone or by letter within the next 1-3 weeks.  Please call us at [phone] if you  have not heard about the biopsies in 3 weeks.    SIGNATURES/CONFIDENTIALITY: You and/or your care partner have signed paperwork which will be entered into your electronic medical record.  These signatures attest to the fact that that the information above on your After Visit Summary has been reviewed and is understood.  Full responsibility of the confidentiality of this discharge information lies with you and/or your care-partner.

## 2021-05-15 NOTE — Progress Notes (Signed)
Called to room to assist during endoscopic procedure.  Patient ID and intended procedure confirmed with present staff. Received instructions for my participation in the procedure from the performing physician.  

## 2021-05-15 NOTE — Progress Notes (Signed)
Report to PACU, RN, vss, BBS= Clear.  

## 2021-05-15 NOTE — Op Note (Signed)
[facility] Patient Name: Cheryl Aguirre Procedure Date: 05/15/2021 11:21 AM MRN: [id number] Endoscopist: Mauri Pole , MD Age: 75 Referring MD:  Date of Birth: 1946-03-07 Gender: Female Account #: [id number] Procedure:                Colonoscopy Indications:              Screening in patient at increased risk: Family                            history of 1st-degree relative with colorectal                            cancer, High risk colon cancer surveillance:                            Personal history of colonic polyps, High risk colon                            cancer surveillance: Personal history of adenoma                            less than 10 mm in size Medicines:                Monitored Anesthesia Care Procedure:                Pre-Anesthesia Assessment:                           - Prior to the procedure, a History and Physical                            was performed, and patient medications and                            allergies were reviewed. The patient's tolerance of                            previous anesthesia was also reviewed. The risks                            and benefits of the procedure and the sedation                            options and risks were discussed with the patient.                            All questions were answered, and informed consent                            was obtained. Prior Anticoagulants: The patient has                            taken no previous anticoagulant or antiplatelet  agents. ASA Grade Assessment: II - A patient with                            mild systemic disease. After reviewing the risks                            and benefits, the patient was deemed in                            satisfactory condition to undergo the procedure.                           After obtaining informed consent, the colonoscope                            was passed under direct vision.  Throughout the                            procedure, the patient's blood pressure, pulse, and                            oxygen saturations were monitored continuously. The                            Olympus [id number]) Colonoscope was                            introduced through the anus and advanced to the the                            cecum, identified by appendiceal orifice and                            ileocecal valve. The colonoscopy was performed                            without difficulty. The patient tolerated the                            procedure well. The quality of the bowel                            preparation was good. The ileocecal valve,                            appendiceal orifice, and rectum were photographed. Scope In: 11:30:55 AM Scope Out: [id number] AM Scope Withdrawal Time: 0 hours 5 minutes 14 seconds  Total Procedure Duration: 0 hours 15 minutes 12 seconds  Findings:                 Scattered small and large-mouthed diverticula were                            found in the sigmoid colon and descending colon.  There was narrowing of the colon in association                            with the diverticular opening. There was evidence                            of diverticular spasm. Erythema was seen in                            association with the diverticular opening.                           Non-bleeding external and internal hemorrhoids were                            found during retroflexion. The hemorrhoids were                            medium-sized.                           The exam was otherwise without abnormality. Complications:            No immediate complications. Estimated Blood Loss:     Estimated blood loss was minimal. Impression:               - Moderate diverticulosis in the sigmoid colon and                            in the descending colon. There was narrowing of the                             colon in association with the diverticular opening.                            There was evidence of diverticular spasm. Erythema                            was seen in association with the diverticular                            opening.                           - Non-bleeding external and internal hemorrhoids.                           - The examination was otherwise normal.                           - No specimens collected. Recommendation:           - Patient has a contact number available for                            emergencies. The signs and symptoms of potential  delayed complications were discussed with the                            patient. Return to normal activities tomorrow.                            Written discharge instructions were provided to the                            patient.                           - Resume previous diet.                           - Continue present medications.                           - No repeat colonoscopy due to age. Mauri Pole, MD 05/15/2021 11:55:35 AM This report has been signed electronically.

## 2021-05-19 ENCOUNTER — Telehealth: Payer: Self-pay | Admitting: *Deleted

## 2021-05-19 ENCOUNTER — Telehealth: Payer: Self-pay

## 2021-05-19 NOTE — Telephone Encounter (Signed)
Message left

## 2021-05-19 NOTE — Telephone Encounter (Signed)
  Follow up Call-  Call back number 05/15/2021  Post procedure Call Back phone  # [phone]  Permission to leave phone message Yes  Some recent data might be hidden     Patient questions:  Do you have a fever, pain , or abdominal swelling? No. Pain Score  0 *  Have you tolerated food without any problems? Yes.    Have you been able to return to your normal activities? Yes.    Do you have any questions about your discharge instructions: Diet   No. Medications  No. Follow up visit  No.  Do you have questions or concerns about your Care? No.  Actions: * If pain score is 4 or above: No action needed, pain <4.

## 2021-06-03 ENCOUNTER — Encounter: Payer: Self-pay | Admitting: Gastroenterology

## 2021-06-18 ENCOUNTER — Other Ambulatory Visit: Payer: Self-pay | Admitting: Hematology and Oncology

## 2021-06-18 DIAGNOSIS — Z853 Personal history of malignant neoplasm of breast: Secondary | ICD-10-CM

## 2021-06-20 ENCOUNTER — Telehealth: Payer: Self-pay | Admitting: Gastroenterology

## 2021-06-20 NOTE — Telephone Encounter (Signed)
Please advise? I want to confirm before calling Pam. Thank you.

## 2021-06-20 NOTE — Telephone Encounter (Signed)
Patient called wanting to confirm if Dr. Silverio Decamp stills wants her to take Famotidine please advise.

## 2021-06-23 NOTE — Telephone Encounter (Signed)
I have sent her a [facility] message since we are unable to reach her by phone.

## 2021-06-23 NOTE — Telephone Encounter (Signed)
Please advise patient to take pepcid after dinner as needed and continue daily pantoprazole '40mg'$ . Thanks

## 2021-06-25 ENCOUNTER — Encounter: Payer: Self-pay | Admitting: Internal Medicine

## 2021-06-25 ENCOUNTER — Ambulatory Visit (INDEPENDENT_AMBULATORY_CARE_PROVIDER_SITE_OTHER): Payer: Medicare Other | Admitting: Internal Medicine

## 2021-06-25 ENCOUNTER — Other Ambulatory Visit: Payer: Self-pay

## 2021-06-25 VITALS — BP 118/74 | HR 77 | Ht 62.0 in | Wt 106.4 lb

## 2021-06-25 DIAGNOSIS — E039 Hypothyroidism, unspecified: Secondary | ICD-10-CM

## 2021-06-25 LAB — T4, FREE: Free T4: 0.95 ng/dL (ref 0.60–1.60)

## 2021-06-25 LAB — TSH: TSH: 2.44 u[IU]/mL (ref 0.35–5.50)

## 2021-06-25 NOTE — Telephone Encounter (Signed)
Patient has not responded , If she calls the office please get her correct number

## 2021-06-25 NOTE — Progress Notes (Signed)
Name: Cheryl Aguirre  MRN/ DOB: [id number], Dec 18, 1945    Age/ Sex: 75 y.o., female     PCP: Abner Greenspan, MD   Reason for [facility] Evaluation: Subclinical Hypothyroidism     [facility] Visit: 03/24/2021    PATIENT IDENTIFIER: Cheryl Aguirre is a 75 y.o., female with a past medical history of . Hx of Breast Ca ( S/P lumpectomy 2019) She has followed with [facility] since 03/24/2021 for consultative assistance with management of her Subclinical Hypothyroidism.   HISTORICAL SUMMARY:   She has been noted with slight and intermittent TSH elevation since 12/2019 with normal FT4.     Had radiation for breast Ca We started LT-4 replacement 03/2021 with a TSH 5.49 uIU/mL    Three sisters with thyroid disease     SUBJECTIVE:    Today (06/25/2021):  Cheryl Aguirre is here for  hypothyroidism.  She continues with weight loss  Constipation has resolved  Palpitations are better  Slight improvement in energy  Continue with night sweats     Levothyroxine 25 mcg daily   HISTORY:  Past Medical History:  Past Medical History:  Diagnosis Date   Anxiety    Arthritis    left hip   Breast cancer ([place])    Cancer ([place]) 2019   breast   Chronic headaches    Colon polyps    Depression    Family history of breast cancer    Family history of colon cancer    Family history of pancreatic cancer    Family history of thyroid cancer    GERD (gastroesophageal reflux disease)    Hypertension    elevated at times.  no meds   IBS (irritable bowel syndrome)    Insomnia    Osteopenia    Personal history of radiation therapy    2019   Tobacco abuse    Past Surgical History:  Past Surgical History:  Procedure Laterality Date   ABDOMINAL HYSTERECTOMY     bleeding, fibroid, endometriosis (total)   BASAL CELL CARCINOMA EXCISION  11/22/2018   BASAL CELL CARCINOMA EXCISION  07/12/2018   BREAST LUMPECTOMY Right 01/2018   BREAST  LUMPECTOMY WITH RADIOACTIVE SEED AND SENTINEL LYMPH NODE BIOPSY Right 01/31/2018   Procedure: BREAST LUMPECTOMY WITH RADIOACTIVE SEED AND SENTINEL LYMPH NODE BIOPSY;  Surgeon: Jovita Kussmaul, MD;  Location: [facility];  Service: General;  Laterality: Right;   BREAST SURGERY  2019   CATARACT EXTRACTION W/PHACO Left 02/11/2017   Procedure: CATARACT EXTRACTION PHACO AND INTRAOCULAR LENS PLACEMENT ([place]);  Surgeon: Leandrew Koyanagi, MD;  Location: [facility];  Service: Ophthalmology;  Laterality: Left;  [place] 1:02.8 AP% 13.7 CDE 8.58 FLUID PACK LOT # [id number]   CATARACT EXTRACTION W/PHACO Right 03/10/2017   Procedure: CATARACT EXTRACTION PHACO AND INTRAOCULAR LENS PLACEMENT (I[facility]) Right symfony toric lens;  Surgeon: Leandrew Koyanagi, MD;  Location: [facility];  Service: Ophthalmology;  Laterality: Right;  symfony toric lens prefers early   CT of chest  10/09   bronchiectasis   ENDOMETRIAL BIOPSY     LAPAROSCOPY  1984   Social History:  reports that she has been smoking cigarettes. She has a 56.00 pack-year smoking history. She has never used smokeless tobacco. She reports that she does not currently use alcohol. She reports that she does not use drugs. Family History:  Family History  Problem Relation Age of Onset   Cancer Mother        pancreatic, liver  Cancer Father        colon   Colon cancer Father 33   Thyroid disease Sister    Colon polyps Sister    Irritable bowel syndrome Sister    Breast cancer Sister 49   Melanoma Sister 68   Thyroid disease Sister    Thyroid disease Sister    Skin cancer Brother    Breast cancer Maternal Aunt        dx under 83   Glaucoma Maternal Aunt    Colon cancer Paternal Grandmother        dx in her 20s   Thyroid cancer Paternal Grandmother        dx in her 46s   Miscarriages / Stillbirths Cousin    Diabetes Cousin    Breast cancer Cousin 35   Heart disease Other    Esophageal cancer Neg Hx    Stomach cancer Neg Hx    Rectal cancer Neg Hx       HOME MEDICATIONS: Allergies as of 06/25/2021       Reactions   Adhesive [tape]    Tears skin   Alendronate Sodium    REACTION: muscle, joint pain, indigestion   Penicillins Other (See Comments)   Has patient had a PCN reaction causing immediate rash, facial/tongue/throat swelling, SOB or lightheadedness with hypotension: yes Has patient had a PCN reaction causing severe rash involving mucus membranes or skin necrosis: no Has patient had a PCN reaction that required hospitalization: no Has patient had a PCN reaction occurring within the last 10 years: no If all of the above answers are "NO", then may proceed with Cephalosporin use. **CHILDHOOD**   Varenicline Tartrate Nausea Only       Doxycycline Nausea And Vomiting, Rash            Medication List        Accurate as of June 25, 2021  1:46 PM. If you have any questions, ask your nurse or doctor.          STOP taking these medications    ondansetron 4 MG tablet Commonly known as: ZOFRAN Stopped by: Dorita Sciara, MD       TAKE these medications    acetaminophen 500 MG tablet Commonly known as: TYLENOL Take 500 mg by mouth every 8 (eight) hours as needed.   Acidophilus 100 MG Caps Take 1 capsule by mouth daily.   calcium carbonate 500 MG chewable tablet Commonly known as: TUMS - dosed in mg elemental calcium Chew 1 tablet by mouth as needed for indigestion or heartburn.   cetirizine 10 MG tablet Commonly known as: ZYRTEC Take 10 mg by mouth daily.   cyclobenzaprine 10 MG tablet Commonly known as: FLEXERIL Take 1 tablet (10 mg total) by mouth at bedtime. What changed:  when to take this reasons to take this   escitalopram 20 MG tablet Commonly known as: LEXAPRO Take 1 tablet (20 mg total) by mouth daily.   famotidine 20 MG tablet Commonly known as: Pepcid Take 1 tablet (20 mg total) by mouth 2 (two) times daily.   fish oil-omega-3 fatty acids 1000 MG capsule Take 1 g by mouth  daily.   levothyroxine 25 MCG tablet Commonly known as: SYNTHROID Take 1 tablet (25 mcg total) by mouth daily before breakfast.   Magnesium 250 MG Tabs Take 1 tablet by mouth daily as needed (constipation).   melatonin 5 MG Tabs Take 5 mg by mouth at bedtime. As directed at bedtime.  montelukast 10 MG tablet Commonly known as: SINGULAIR Take 1 tablet (10 mg total) by mouth at bedtime.   multivitamin tablet Take 1 tablet by mouth daily.   pantoprazole 40 MG tablet Commonly known as: PROTONIX Take 1 tablet (40 mg total) by mouth daily.   sodium chloride HYPERTONIC 3 % nebulizer solution Take by nebulization as needed for other.   SUMAtriptan 50 MG tablet Commonly known as: IMITREX Take 50 mg by mouth daily as needed. May repeat in 2 hours if headache persists or recurs.   tamoxifen 20 MG tablet Commonly known as: NOLVADEX Take 1 tablet (20 mg total) by mouth daily.   Turmeric 500 MG Caps Take 1 tablet by mouth daily.   VITAMIN K2-VITAMIN D3 PO Take 1 capsule by mouth daily.          OBJECTIVE:   PHYSICAL EXAM: VS: BP 118/74 (BP Location: Left Arm, Patient Position: Sitting, Cuff Size: Small)   Pulse 77   Ht '5\' 2"'$  (1.575 m)   Wt 106 lb 6.4 oz (48.3 kg)   SpO2 95%   BMI 19.46 kg/m    EXAM: General: Pt appears well and is in NAD  Neck: General: Supple without adenopathy. Thyroid: Thyroid size normal.  No goiter or nodules appreciated.   Lungs: Clear with good BS bilat with no rales, rhonchi, or wheezes  Heart: Auscultation: RRR.  Abdomen: Normoactive bowel sounds, soft, nontender, without masses or organomegaly palpable  Extremities:  BL LE: No pretibial edema normal ROM and strength.  Mental Status: Judgment, insight: Intact Orientation: Oriented to time, place, and person Mood and affect: No depression, anxiety, or agitation     DATA REVIEWED: Results for NATASCHA, KIELTY "PAM" (MRN [id number]) as of 06/26/2021 07:56  Ref. Range 06/25/2021  14:02  TSH Latest Ref Range: 0.35 - 5.50 uIU/mL 2.44  T4,Free(Direct) Latest Ref Range: 0.60 - 1.60 ng/dL 0.95    Results for KALLISSA, HONTS STACY "PAM" (MRN [id number]) as of 06/25/2021 12:52  Ref. Range 05/05/2021 14:04  Thyroperoxidase Ab SerPl-aCnc Latest Ref Range: <9 IU/mL 1    ASSESSMENT / PLAN / RECOMMENDATIONS:   Hypothyroidism:   - Pt is clinically euthyroid  - No local neck symptoms  - Has been taking levothyroxine appropriately   Medications   Continue Levothyroxine 25 mcg daily    Signed electronically by: Mack Guise, MD  [facility]  [address] [place], [place] [address] Phone: [phone] FAX: [phone]      CC: Tower, Wynelle Fanny, MD [place] [address] Phone: [phone]  Fax: [phone]   Return to [facility] clinic as below: Future Appointments  Date Time Provider [place]  07/24/2021  3:40 PM [facility] DIAG T[facility] 1 [facility] GI-BREAST CE  12/29/2021  3:30 PM Nicholas Lose, MD [facility] None

## 2021-06-25 NOTE — Patient Instructions (Signed)

## 2021-06-26 MED ORDER — LEVOTHYROXINE SODIUM 25 MCG PO TABS: 25.0000 ug | ORAL_TABLET | Freq: Every day | ORAL | 3 refills | Status: DC

## 2021-08-07 ENCOUNTER — Other Ambulatory Visit: Payer: Self-pay | Admitting: *Deleted

## 2021-08-07 MED ORDER — PANTOPRAZOLE SODIUM 40 MG PO TBEC: 40.0000 mg | DELAYED_RELEASE_TABLET | Freq: Every day | ORAL | 3 refills | Status: DC

## 2021-08-20 ENCOUNTER — Other Ambulatory Visit: Payer: Self-pay

## 2021-08-20 ENCOUNTER — Ambulatory Visit
Admission: RE | Admit: 2021-08-20 | Discharge: 2021-08-20 | Disposition: A | Discharge status: Home / Self Care | Payer: Medicare Other | Source: Ambulatory Visit | Attending: Hematology and Oncology | Admitting: Hematology and Oncology

## 2021-08-20 DIAGNOSIS — R922 Inconclusive mammogram: Secondary | ICD-10-CM | POA: Diagnosis not present

## 2021-08-20 DIAGNOSIS — Z853 Personal history of malignant neoplasm of breast: Secondary | ICD-10-CM

## 2021-10-14 DIAGNOSIS — Z20822 Contact with and (suspected) exposure to covid-19: Secondary | ICD-10-CM | POA: Diagnosis not present

## 2021-10-28 ENCOUNTER — Other Ambulatory Visit: Payer: Self-pay | Admitting: *Deleted

## 2021-10-28 MED ORDER — FAMOTIDINE 20 MG PO TABS: 20.0000 mg | ORAL_TABLET | Freq: Two times a day (BID) | ORAL | 3 refills | Status: AC

## 2021-12-12 ENCOUNTER — Other Ambulatory Visit: Payer: Self-pay | Admitting: Family Medicine

## 2021-12-12 NOTE — Telephone Encounter (Signed)
Pt hasn't been seen in a year, please schedule AWV (nurse)/ CPE with PCP and route back to me ?

## 2021-12-17 NOTE — Telephone Encounter (Signed)
Pt husband said he will let her know to call me back to schedule AWV ?

## 2021-12-22 ENCOUNTER — Telehealth: Payer: Self-pay

## 2021-12-22 ENCOUNTER — Other Ambulatory Visit: Payer: Self-pay | Admitting: Hematology and Oncology

## 2021-12-22 NOTE — Telephone Encounter (Signed)
Patient states that she has appt with her pcp and they will be running all her labs. She wants to know if she needs to keep her appt for this Wednesday.  ?

## 2021-12-22 NOTE — Telephone Encounter (Signed)
Vm left for patient and mychart message sent with info  ?

## 2021-12-24 ENCOUNTER — Ambulatory Visit: Payer: Medicare Other | Admitting: Internal Medicine

## 2021-12-24 NOTE — Progress Notes (Signed)
? ?Patient Care Team: ?Tower, Wynelle Fanny, MD as PCP - General ?Leandrew Koyanagi, MD as Referring Physician (Ophthalmology) ?Mauri Pole, MD as Consulting Physician (Gastroenterology) ?Nicholas Lose, MD as Consulting Physician (Hematology and Oncology) ?Kyung Rudd, MD as Consulting Physician (Radiation Oncology) ?Jovita Kussmaul, MD as Consulting Physician (General Surgery) ?Gardenia Phlegm, NP as Nurse Practitioner (Hematology and Oncology) ?Hunsucker, Bonna Gains, MD as Consulting Physician (Pulmonary Disease) ? ?DIAGNOSIS:  ?Encounter Diagnosis  ?Name Primary?  ? Malignant neoplasm of lower-inner quadrant of right breast of female, estrogen receptor positive ([place])   ? ? ?SUMMARY OF ONCOLOGIC HISTORY: ?Oncology History  ?Malignant neoplasm of lower-inner quadrant of right breast of female, estrogen receptor positive ([place])  ?01/31/2018 Initial Diagnosis  ? Right lumpectomy: IDC with DCIS 0.4 cm, margins negative, 0/2 lymph nodes negative, grade 2, ER 100%, PR 100%, HER-2 negative ratio 1.19, T1 a N0 stage I a ?  ?02/16/2018 Cancer Staging  ? Staging form: Breast, AJCC 8th Edition ?- Pathologic: Stage IA (pT1a, pN0, cM0, G2, ER+, PR+, HER2-) - Signed by Gardenia Phlegm, NP on 02/16/2018 ? ?  ?03/03/2018 - 03/29/2018 Radiation Therapy  ? Adjuvant radiation therapy ?  ?03/29/2018 Genetic Testing  ? Negative genetic testing on the multi-cancer panel.  The Multi-Gene Panel offered by [facility] includes sequencing and/or deletion duplication testing of the following 83 genes: ALK, APC, ATM, AXIN2,BAP1,  BARD1, BLM, BMPR1A, BRCA1, BRCA2, BRIP1, CASR, CDC73, CDH1, CDK4, CDKN1B, CDKN1C, CDKN2A (p14ARF), CDKN2A (p16INK4a), CEBPA, CHEK2, CTNNA1, DICER1, DIS3L2, EGFR (c.2369C>T, p.Thr790Met variant only), EPCAM (Deletion/duplication testing only), FH, FLCN, GATA2, GPC3, GREM1 (Promoter region deletion/duplication testing only), HOXB13 (c.251G>A, p.Gly84Glu), HRAS, KIT, MAX, MEN1, MET, MITF (c.952G>A, p.Glu318Lys  variant only), MLH1, MSH2, MSH3, MSH6, MUTYH, NBN, NF1, NF2, NTHL1, PALB2, PDGFRA, PHOX2B, PMS2, POLD1, POLE, POT1, PRKAR1A, PTCH1, PTEN, RAD50, RAD51C, RAD51D, RB1, RECQL4, RET, RUNX1, SDHAF2, SDHA (sequence changes only), SDHB, SDHC, SDHD, SMAD4, SMARCA4, SMARCB1, SMARCE1, STK11, SUFU, TERT, TERT, TMEM127, TP53, TSC1, TSC2, VHL, WRN and WT1.  The report date is March 29, 2018. ?  ?03/2018 -  Anti-estrogen oral therapy  ? Tamoxifen daily ?  ? ? ?CHIEF COMPLIANT:  Follow-up of right breast cancer on tamoxifen therapy ?  ? ?INTERVAL HISTORY: Cheryl Aguirre is a 76 y.o. with above-mentioned history of right breast cancer treated with lumpectomy, radiation, and who is currently on antiestrogen therapy with tamoxifen. Mammogram on 07/22/20 showed no evidence of malignancy bilaterally. She presents to the clinic today for annual follow-up.  She is tolerating tamoxifen extremely well without any major problems.  She does have occasional hot flashes and dry skin.  She denies any lumps or nodules or pain in the breast. ? ? ?ALLERGIES:  is allergic to adhesive [tape], alendronate sodium, penicillins, varenicline tartrate, and doxycycline. ? ?MEDICATIONS:  ?Current Outpatient Medications  ?Medication Sig Dispense Refill  ? acetaminophen (TYLENOL) 500 MG tablet Take 500 mg by mouth every 8 (eight) hours as needed.    ? calcium carbonate (TUMS - DOSED IN MG ELEMENTAL CALCIUM) 500 MG chewable tablet Chew 1 tablet by mouth as needed for indigestion or heartburn.    ? cetirizine (ZYRTEC) 10 MG tablet Take 10 mg by mouth daily.    ? cyclobenzaprine (FLEXERIL) 10 MG tablet Take 1 tablet (10 mg total) by mouth at bedtime. (Patient taking differently: Take 10 mg by mouth daily as needed (migraine).) 5 tablet 0  ? escitalopram (LEXAPRO) 20 MG tablet Take 1 tablet (20 mg total) by mouth daily. [place]  tablet 3  ? famotidine (PEPCID) 20 MG tablet Take 1 tablet (20 mg total) by mouth 2 (two) times daily. 180 tablet 3  ? fish oil-omega-3  fatty acids 1000 MG capsule Take 1 g by mouth daily.    ? Lactobacillus (ACIDOPHILUS) 100 MG CAPS Take 1 capsule by mouth daily.    ? levothyroxine (SYNTHROID) 25 MCG tablet Take 1 tablet (25 mcg total) by mouth daily before breakfast. 90 tablet 3  ? Magnesium 250 MG TABS Take 1 tablet by mouth daily as needed (constipation).     ? Melatonin 5 MG TABS Take 5 mg by mouth at bedtime. As directed at bedtime.    ? montelukast (SINGULAIR) 10 MG tablet Take 1 tablet (10 mg total) by mouth at bedtime. 90 tablet 3  ? Multiple Vitamin (MULTIVITAMIN) tablet Take 1 tablet by mouth daily.    ? pantoprazole (PROTONIX) 40 MG tablet Take 1 tablet (40 mg total) by mouth daily. 90 tablet 3  ? sodium chloride HYPERTONIC 3 % nebulizer solution Take by nebulization as needed for other. 360 mL 12  ? SUMAtriptan (IMITREX) 50 MG tablet Take 50 mg by mouth daily as needed. May repeat in 2 hours if headache persists or recurs. (Patient not taking: Reported on 06/25/2021)    ? tamoxifen (NOLVADEX) 20 MG tablet TAKE 1 TABLET DAILY 90 tablet 3  ? Turmeric 500 MG CAPS Take 1 tablet by mouth daily.    ? Vitamin D-Vitamin K (VITAMIN K2-VITAMIN D3 PO) Take 1 capsule by mouth daily.    ? ?Current Facility-Administered Medications  ?Medication Dose Route Frequency Provider Last Rate Last Admin  ? 0.9 %  sodium chloride infusion  500 mL Intravenous Once Nandigam, Venia Minks, MD      ? ? ?PHYSICAL EXAMINATION: ?ECOG PERFORMANCE STATUS: 1 - Symptomatic but completely ambulatory ? ?Vitals:  ? 12/29/21 1537  ?BP: (!) 142/64  ?Pulse: 81  ?Resp: 17  ?Temp: 97.8 ?F (36.6 ?C)  ?SpO2: 97%  ? ?Filed Weights  ? 12/29/21 1537  ?Weight: 108 lb 4.8 oz (49.1 kg)  ? ? ?BREAST: No palpable masses or nodules in either right or left breasts. No palpable axillary supraclavicular or infraclavicular adenopathy no breast tenderness or nipple discharge. (exam performed in the presence of a chaperone) ? ?LABORATORY DATA:  ?I have reviewed the data as listed ?CMP Latest Ref Rng  & Units 12/29/2021 01/06/2021 12/13/2019  ?Glucose 70 - 99 mg/dL 97 103(H) 98  ?BUN 6 - 23 mg/dL 14 13 17   ?Creatinine 0.40 - 1.20 mg/dL 0.81 0.79 0.86  ?Sodium 135 - 145 mEq/L 139 139 139  ?Potassium 3.5 - 5.1 mEq/L 4.4 4.2 4.5  ?Chloride 96 - 112 mEq/L 103 103 103  ?CO2 19 - 32 mEq/L 31 29 29   ?Calcium 8.4 - 10.5 mg/dL 8.9 9.2 9.4  ?Total Protein 6.0 - 8.3 g/dL 6.4 6.7 7.3  ?Total Bilirubin 0.2 - 1.2 mg/dL 0.4 0.3 0.5  ?Alkaline Phos 39 - 117 U/L 65 68 73  ?AST 0 - 37 U/L 21 22 25   ?ALT 0 - 35 U/L 11 11 13   ? ? ?Lab Results  ?Component Value Date  ? WBC 6.1 12/29/2021  ? HGB 13.5 12/29/2021  ? HCT 39.8 12/29/2021  ? MCV 96.9 12/29/2021  ? PLT 231.0 12/29/2021  ? NEUTROABS 3.0 12/29/2021  ? ? ?ASSESSMENT & PLAN:  ?Malignant neoplasm of lower-inner quadrant of right breast of female, estrogen receptor positive ([place]) ?01/31/2018: Right lumpectomy: IDC with DCIS 0.4  cm, margins negative, 0/2 lymph nodes negative, grade 2, ER 100%, PR 100%, HER-2 negative ratio 1.19, T1 a N0 stage I a ?  ?Adjuvant radiation therapy 03/03/2018-03/29/2018 ?Treatment plan: Tamoxifen 20 mg daily to start 04/11/2018 (because the patient has severe osteoporosis we are not using aromatase inhibitor therapy) ?  ?Tamoxifen Toxicities: ?Hot flashes and sweats ?Dryness of the skin ?She will complete 5 years of tamoxifen therapy next year.  After that we can discontinue. ?  ?Breast Cancer Surveillance: ?1. Breast Exam:12/29/21: Benign ?2. Mammogram: 08/20/21: Benign breast density category C ?3.  Bone density 10/10/2019: T score -2.1: Osteopenia bone density has improved compared to 2018.  The T score was -2.3. ?4.  CT chest 08/21/2020: Central lobar emphysema, bronchiectasis, stable lung nodules ?  ?RTC in 1 year for follow up and after that she could be seen on an as-needed basis. ? ? ? ?No orders of the defined types were placed in this encounter. ? ?The patient has a good understanding of the overall plan. she agrees with it. she will call with any  problems that may develop before the next visit here. ?Total time spent: 30 mins including face to face time and time spent for planning, charting and co-ordination of care ? ? Harriette Ohara, MD ?03/20/

## 2021-12-28 ENCOUNTER — Telehealth: Payer: Self-pay | Admitting: Family Medicine

## 2021-12-28 DIAGNOSIS — Z0000 Encounter for general adult medical examination without abnormal findings: Secondary | ICD-10-CM

## 2021-12-28 DIAGNOSIS — E7800 Pure hypercholesterolemia, unspecified: Secondary | ICD-10-CM

## 2021-12-28 DIAGNOSIS — E038 Other specified hypothyroidism: Secondary | ICD-10-CM

## 2021-12-28 DIAGNOSIS — R5383 Other fatigue: Secondary | ICD-10-CM | POA: Insufficient documentation

## 2021-12-28 DIAGNOSIS — R7303 Prediabetes: Secondary | ICD-10-CM

## 2021-12-28 DIAGNOSIS — R5382 Chronic fatigue, unspecified: Secondary | ICD-10-CM

## 2021-12-28 NOTE — Telephone Encounter (Signed)
Labs for annual visit  ?

## 2021-12-29 ENCOUNTER — Inpatient Hospital Stay: Payer: Medicare Other | Attending: Hematology and Oncology | Admitting: Hematology and Oncology

## 2021-12-29 ENCOUNTER — Other Ambulatory Visit (INDEPENDENT_AMBULATORY_CARE_PROVIDER_SITE_OTHER): Payer: Medicare Other

## 2021-12-29 ENCOUNTER — Other Ambulatory Visit: Payer: Self-pay

## 2021-12-29 DIAGNOSIS — E7800 Pure hypercholesterolemia, unspecified: Secondary | ICD-10-CM | POA: Diagnosis not present

## 2021-12-29 DIAGNOSIS — Z923 Personal history of irradiation: Secondary | ICD-10-CM | POA: Diagnosis not present

## 2021-12-29 DIAGNOSIS — R5382 Chronic fatigue, unspecified: Secondary | ICD-10-CM | POA: Diagnosis not present

## 2021-12-29 DIAGNOSIS — E038 Other specified hypothyroidism: Secondary | ICD-10-CM

## 2021-12-29 DIAGNOSIS — R7303 Prediabetes: Secondary | ICD-10-CM | POA: Diagnosis not present

## 2021-12-29 DIAGNOSIS — Z170 Estrogen receptor positive status [ER+]: Secondary | ICD-10-CM | POA: Diagnosis not present

## 2021-12-29 DIAGNOSIS — Z79810 Long term (current) use of selective estrogen receptor modulators (SERMs): Secondary | ICD-10-CM | POA: Insufficient documentation

## 2021-12-29 DIAGNOSIS — Z0000 Encounter for general adult medical examination without abnormal findings: Secondary | ICD-10-CM

## 2021-12-29 DIAGNOSIS — C50311 Malignant neoplasm of lower-inner quadrant of right female breast: Secondary | ICD-10-CM | POA: Insufficient documentation

## 2021-12-29 DIAGNOSIS — Z79899 Other long term (current) drug therapy: Secondary | ICD-10-CM | POA: Insufficient documentation

## 2021-12-29 LAB — TSH: TSH: 3.09 u[IU]/mL (ref 0.35–5.50)

## 2021-12-29 LAB — COMPREHENSIVE METABOLIC PANEL
ALT: 11 U/L (ref 0–35)
AST: 21 U/L (ref 0–37)
Albumin: 3.8 g/dL (ref 3.5–5.2)
Alkaline Phosphatase: 65 U/L (ref 39–117)
BUN: 14 mg/dL (ref 6–23)
CO2: 31 mEq/L (ref 19–32)
Calcium: 8.9 mg/dL (ref 8.4–10.5)
Chloride: 103 mEq/L (ref 96–112)
Creatinine, Ser: 0.81 mg/dL (ref 0.40–1.20)
GFR: 71.53 mL/min (ref >60.00)
Glucose, Bld: 97 mg/dL (ref 70–99)
Potassium: 4.4 mEq/L (ref 3.5–5.1)
Sodium: 139 mEq/L (ref 135–145)
Total Bilirubin: 0.4 mg/dL (ref 0.2–1.2)
Total Protein: 6.4 g/dL (ref 6.0–8.3)

## 2021-12-29 LAB — CBC WITH DIFFERENTIAL/PLATELET
Basophils Absolute: 0 10*3/uL (ref 0.0–0.1)
Basophils Relative: 0.7 % (ref 0.0–3.0)
Eosinophils Absolute: 0.2 10*3/uL (ref 0.0–0.7)
Eosinophils Relative: 3.2 % (ref 0.0–5.0)
HCT: 39.8 % (ref 36.0–46.0)
Hemoglobin: 13.5 g/dL (ref 12.0–15.0)
Lymphocytes Relative: 35.3 % (ref 12.0–46.0)
Lymphs Abs: 2.2 10*3/uL (ref 0.7–4.0)
MCHC: 33.8 g/dL (ref 30.0–36.0)
MCV: 96.9 fl (ref 78.0–100.0)
Monocytes Absolute: 0.7 10*3/uL (ref 0.1–1.0)
Monocytes Relative: 11.1 % (ref 3.0–12.0)
Neutro Abs: 3 10*3/uL (ref 1.4–7.7)
Neutrophils Relative %: 49.7 % (ref 43.0–77.0)
Platelets: 231 10*3/uL (ref 150.0–400.0)
RBC: 4.11 Mil/uL (ref 3.87–5.11)
RDW: 13.5 % (ref 11.5–15.5)
WBC: 6.1 10*3/uL (ref 4.0–10.5)

## 2021-12-29 LAB — HEMOGLOBIN A1C: Hgb A1c MFr Bld: 5.9 % (ref 4.6–6.5)

## 2021-12-29 LAB — LIPID PANEL
Cholesterol: 167 mg/dL (ref 0–200)
HDL: 73 mg/dL (ref >39.00)
LDL Cholesterol: 79 mg/dL (ref 0–99)
NonHDL: 94.01
Total CHOL/HDL Ratio: 2
Triglycerides: 76 mg/dL (ref 0.0–149.0)
VLDL: 15.2 mg/dL (ref 0.0–40.0)

## 2021-12-29 NOTE — Assessment & Plan Note (Signed)
01/31/2018:?Right lumpectomy: IDC with DCIS 0.4 cm, margins negative, 0/2 lymph nodes negative, grade 2, ER 100%, PR 100%, HER-2 negative ratio 1.19, T1 a N0 stage I a ?? ?Adjuvant radiation therapy 03/03/2018-03/29/2018 ?Treatment plan:?Tamoxifen 20 mg daily?to start 04/11/2018?(because the patient has severe osteoporosis we are not using aromatase inhibitor therapy) ?? ?Tamoxifen?Toxicities: ?Hot flashes and sweats ?? ?Breast Cancer Surveillance: ?1. Breast Exam:12/29/21: Benign ?2. Mammogram:?08/20/21: Benign breast density category C ?3.??Bone density 10/10/2019: T score -2.1: Osteopenia?bone density has improved compared to 2018. ?The T score was -2.3. ?4.  CT chest 08/21/2020: Central lobar emphysema, bronchiectasis, stable lung nodules ?? ?RTC in 1 year for follow up ?

## 2021-12-30 ENCOUNTER — Ambulatory Visit: Payer: Medicare Other

## 2021-12-31 ENCOUNTER — Ambulatory Visit (INDEPENDENT_AMBULATORY_CARE_PROVIDER_SITE_OTHER): Payer: Medicare Other

## 2021-12-31 VITALS — Wt 108.0 lb

## 2021-12-31 DIAGNOSIS — Z0000 Encounter for general adult medical examination without abnormal findings: Secondary | ICD-10-CM | POA: Diagnosis not present

## 2021-12-31 NOTE — Patient Instructions (Signed)
Cheryl Aguirre , ?Thank you for taking time to come for your Medicare Wellness Visit. I appreciate your ongoing commitment to your health goals. Please review the following plan we discussed and let me know if I can assist you in the future.  ? ?Screening recommendations/referrals: ?Colonoscopy: Done 05/15/21 - repeat in 3 years ?Mammogram: Done 08/20/21 - Repeat annually ?Bone Density: one 10/10/2019 - Repeat every 2 years  ?Recommended yearly ophthalmology/optometry visit for glaucoma screening and checkup ?Recommended yearly dental visit for hygiene and checkup ? ?Vaccinations: ?Influenza vaccine: Done 08/14/21 - Repeat annually ?Pneumococcal vaccine: Done 11/19/14 & 11/01/12 ?Tdap vaccine: Done 07/24/2010 - Repeat in 10 years *past due ?Shingles vaccine: Zostavax done 2015 - Shingrix is 2 doses 2-6 months apart and over 90% effective     ?Covid-19:Done 01/04/2020 & 01/25/2020 - for additional boosters, contact pharmacy ? ?Advanced directives: in chart ? ?Conditions/risks identified: Aim for 30 minutes of exercise or brisk walking, 6-8 glasses of water, and 5 servings of fruits and vegetables each day.  ? ?Next appointment: Follow up in one year for your annual wellness visit  ? ? ?Preventive Care 28 Years and Older, Female ?Preventive care refers to lifestyle choices and visits with your health care provider that can promote health and wellness. ?What does preventive care include? ?A yearly physical exam. This is also called an annual well check. ?Dental exams once or twice a year. ?Routine eye exams. Ask your health care provider how often you should have your eyes checked. ?Personal lifestyle choices, including: ?Daily care of your teeth and gums. ?Regular physical activity. ?Eating a healthy diet. ?Avoiding tobacco and drug use. ?Limiting alcohol use. ?Practicing safe sex. ?Taking low-dose aspirin every day. ?Taking vitamin and mineral supplements as recommended by your health care provider. ?What happens during an  annual well check? ?The services and screenings done by your health care provider during your annual well check will depend on your age, overall health, lifestyle risk factors, and family history of disease. ?Counseling  ?Your health care provider may ask you questions about your: ?Alcohol use. ?Tobacco use. ?Drug use. ?Emotional well-being. ?Home and relationship well-being. ?Sexual activity. ?Eating habits. ?History of falls. ?Memory and ability to understand (cognition). ?Work and work Statistician. ?Reproductive health. ?Screening  ?You may have the following tests or measurements: ?Height, weight, and BMI. ?Blood pressure. ?Lipid and cholesterol levels. These may be checked every 5 years, or more frequently if you are over 53 years old. ?Skin check. ?Lung cancer screening. You may have this screening every year starting at age 55 if you have a 30-pack-year history of smoking and currently smoke or have quit within the past 15 years. ?Fecal occult blood test (FOBT) of the stool. You may have this test every year starting at age 8. ?Flexible sigmoidoscopy or colonoscopy. You may have a sigmoidoscopy every 5 years or a colonoscopy every 10 years starting at age 15. ?Hepatitis C blood test. ?Hepatitis B blood test. ?Sexually transmitted disease (STD) testing. ?Diabetes screening. This is done by checking your blood sugar (glucose) after you have not eaten for a while (fasting). You may have this done every 1-3 years. ?Bone density scan. This is done to screen for osteoporosis. You may have this done starting at age 26. ?Mammogram. This may be done every 1-2 years. Talk to your health care provider about how often you should have regular mammograms. ?Talk with your health care provider about your test results, treatment options, and if necessary, the need for more tests. ?  Vaccines  ?Your health care provider may recommend certain vaccines, such as: ?Influenza vaccine. This is recommended every year. ?Tetanus,  diphtheria, and acellular pertussis (Tdap, Td) vaccine. You may need a Td booster every 10 years. ?Zoster vaccine. You may need this after age 9. ?Pneumococcal 13-valent conjugate (PCV13) vaccine. One dose is recommended after age 15. ?Pneumococcal polysaccharide (PPSV23) vaccine. One dose is recommended after age 40. ?Talk to your health care provider about which screenings and vaccines you need and how often you need them. ?This information is not intended to replace advice given to you by your health care provider. Make sure you discuss any questions you have with your health care provider. ?Document Released: 10/25/2015 Document Revised: 06/17/2016 Document Reviewed: 07/30/2015 ?Elsevier Interactive Patient Education ? 2017 National. ? ?Fall Prevention in the Home ?Falls can cause injuries. They can happen to people of all ages. There are many things you can do to make your home safe and to help prevent falls. ?What can I do on the outside of my home? ?Regularly fix the edges of walkways and driveways and fix any cracks. ?Remove anything that might make you trip as you walk through a door, such as a raised step or threshold. ?Trim any bushes or trees on the path to your home. ?Use bright outdoor lighting. ?Clear any walking paths of anything that might make someone trip, such as rocks or tools. ?Regularly check to see if handrails are loose or broken. Make sure that both sides of any steps have handrails. ?Any raised decks and porches should have guardrails on the edges. ?Have any leaves, snow, or ice cleared regularly. ?Use sand or salt on walking paths during winter. ?Clean up any spills in your garage right away. This includes oil or grease spills. ?What can I do in the bathroom? ?Use night lights. ?Install grab bars by the toilet and in the tub and shower. Do not use towel bars as grab bars. ?Use non-skid mats or decals in the tub or shower. ?If you need to sit down in the shower, use a plastic,  non-slip stool. ?Keep the floor dry. Clean up any water that spills on the floor as soon as it happens. ?Remove soap buildup in the tub or shower regularly. ?Attach bath mats securely with double-sided non-slip rug tape. ?Do not have throw rugs and other things on the floor that can make you trip. ?What can I do in the bedroom? ?Use night lights. ?Make sure that you have a light by your bed that is easy to reach. ?Do not use any sheets or blankets that are too big for your bed. They should not hang down onto the floor. ?Have a firm chair that has side arms. You can use this for support while you get dressed. ?Do not have throw rugs and other things on the floor that can make you trip. ?What can I do in the kitchen? ?Clean up any spills right away. ?Avoid walking on wet floors. ?Keep items that you use a lot in easy-to-reach places. ?If you need to reach something above you, use a strong step stool that has a grab bar. ?Keep electrical cords out of the way. ?Do not use floor polish or wax that makes floors slippery. If you must use wax, use non-skid floor wax. ?Do not have throw rugs and other things on the floor that can make you trip. ?What can I do with my stairs? ?Do not leave any items on the stairs. ?Make sure that  there are handrails on both sides of the stairs and use them. Fix handrails that are broken or loose. Make sure that handrails are as long as the stairways. ?Check any carpeting to make sure that it is firmly attached to the stairs. Fix any carpet that is loose or worn. ?Avoid having throw rugs at the top or bottom of the stairs. If you do have throw rugs, attach them to the floor with carpet tape. ?Make sure that you have a light switch at the top of the stairs and the bottom of the stairs. If you do not have them, ask someone to add them for you. ?What else can I do to help prevent falls? ?Wear shoes that: ?Do not have high heels. ?Have rubber bottoms. ?Are comfortable and fit you well. ?Are closed  at the toe. Do not wear sandals. ?If you use a stepladder: ?Make sure that it is fully opened. Do not climb a closed stepladder. ?Make sure that both sides of the stepladder are locked into place. ?Ask someone to hol

## 2021-12-31 NOTE — Progress Notes (Signed)
? ?Subjective:  ? Cheryl Aguirre is a 76 y.o. female who presents for Medicare Annual (Subsequent) preventive examination. ? ?Virtual Visit via Telephone Note ? ?I connected with  Cheryl Aguirre on 12/31/21 at 11:15 AM EDT by telephone and verified that I am speaking with the correct person using two identifiers. ? ?Location: ?Patient: Home ?Provider: [facility] ?Persons participating in the virtual visit: patient/Nurse Health Advisor ?  ?I discussed the limitations, risks, security and privacy concerns of performing an evaluation and management service by telephone and the availability of in person appointments. The patient expressed understanding and agreed to proceed. ? ?Interactive audio and video telecommunications were attempted between this nurse and patient, however failed, due to patient having technical difficulties OR patient did not have access to video capability.  We continued and completed visit with audio only. ? ?Some vital signs may be absent or patient reported.  ? ?Findley Blankenbaker Dionne Ano, LPN  ? ?Review of Systems    ? ?Cardiac Risk Factors include: advanced age (>28mn, >>65women);smoking/ tobacco exposure;sedentary lifestyle ? ?   ?Objective:  ?  ?Today's Vitals  ? 12/31/21 1122  ?Weight: 108 lb (49 kg)  ? ?Body mass index is 19.75 kg/m?. ? ? ?  12/31/2021  ? 11:37 AM 12/11/2019  ?  2:47 PM 11/30/2018  ? 11:07 AM 05/02/2018  ?  2:30 PM 02/24/2018  ?  2:49 PM 02/15/2018  ? 10:39 AM 01/31/2018  ?  7:01 AM  ?Advanced Directives  ?Does Patient Have a Medical Advance Directive? Yes Yes Yes Yes Yes Yes Yes  ?Type of [place]amed[place]of [place]Living will [place] will [place] will [place] CityLiving will  [place]Living will Healthcare Power of Attorney  ?Does patient want to make changes to medical advance directive?      No - Patient declined No - Patient declined  ?Copy of  [place]? Yes - validated most recent copy scanned in chart (See row information) No - copy requested Yes - validated most recent copy scanned in chart (See row information)   Yes No - copy requested  ? ? ?Current Medications (verified) ?Outpatient Encounter Medications as of 12/31/2021  ?Medication Sig  ? acetaminophen (TYLENOL) 500 MG tablet Take 500 mg by mouth every 8 (eight) hours as needed.  ? calcium carbonate (TUMS - DOSED IN MG ELEMENTAL CALCIUM) 500 MG chewable tablet Chew 1 tablet by mouth as needed for indigestion or heartburn.  ? cetirizine (ZYRTEC) 10 MG tablet Take 10 mg by mouth daily.  ? cyclobenzaprine (FLEXERIL) 10 MG tablet Take 1 tablet (10 mg total) by mouth at bedtime. (Patient taking differently: Take 10 mg by mouth daily as needed (migraine).)  ? escitalopram (LEXAPRO) 20 MG tablet Take 1 tablet (20 mg total) by mouth daily.  ? famotidine (PEPCID) 20 MG tablet Take 1 tablet (20 mg total) by mouth 2 (two) times daily.  ? fish oil-omega-3 fatty acids 1000 MG capsule Take 1 g by mouth daily.  ? Lactobacillus (ACIDOPHILUS) 100 MG CAPS Take 1 capsule by mouth daily.  ? levothyroxine (SYNTHROID) 25 MCG tablet Take 1 tablet (25 mcg total) by mouth daily before breakfast.  ? Magnesium 250 MG TABS Take 1 tablet by mouth daily as needed (constipation).   ? Melatonin 5 MG TABS Take 5 mg by mouth at bedtime. As directed at bedtime.  ? montelukast (SINGULAIR) 10 MG tablet Take 1 tablet (  10 mg total) by mouth at bedtime.  ? Multiple Vitamin (MULTIVITAMIN) tablet Take 1 tablet by mouth daily.  ? pantoprazole (PROTONIX) 40 MG tablet Take 1 tablet (40 mg total) by mouth daily.  ? SUMAtriptan (IMITREX) 50 MG tablet Take 50 mg by mouth daily as needed. May repeat in 2 hours if headache persists or recurs.  ? tamoxifen (NOLVADEX) 20 MG tablet TAKE 1 TABLET DAILY  ? Turmeric 500 MG CAPS Take 1 tablet by mouth daily.  ? Vitamin D-Vitamin K (VITAMIN K2-VITAMIN D3 PO) Take 1 capsule by  mouth daily.  ? sodium chloride HYPERTONIC 3 % nebulizer solution Take by nebulization as needed for other. (Patient not taking: Reported on 12/31/2021)  ? ?Facility-Administered Encounter Medications as of 12/31/2021  ?Medication  ? 0.9 %  sodium chloride infusion  ? ? ?Allergies (verified) ?Adhesive [tape], Alendronate sodium, Penicillins, Varenicline tartrate, and Doxycycline  ? ?History: ?Past Medical History:  ?Diagnosis Date  ? Anxiety   ? Arthritis   ? left hip  ? Breast cancer ([place])   ? Cancer [facility]) 2019  ? breast  ? Chronic headaches   ? Colon polyps   ? Depression   ? Family history of breast cancer   ? Family history of colon cancer   ? Family history of pancreatic cancer   ? Family history of thyroid cancer   ? GERD (gastroesophageal reflux disease)   ? Hypertension   ? elevated at times.  no meds  ? IBS (irritable bowel syndrome)   ? Insomnia   ? Osteopenia   ? Personal history of radiation therapy   ? 2019  ? Tobacco abuse   ? ?Past Surgical History:  ?Procedure Laterality Date  ? ABDOMINAL HYSTERECTOMY    ? bleeding, fibroid, endometriosis (total)  ? BASAL CELL CARCINOMA EXCISION  11/22/2018  ? BASAL CELL CARCINOMA EXCISION  07/12/2018  ? BREAST LUMPECTOMY Right 01/2018  ? BREAST LUMPECTOMY WITH RADIOACTIVE SEED AND SENTINEL LYMPH NODE BIOPSY Right 01/31/2018  ? Procedure: BREAST LUMPECTOMY WITH RADIOACTIVE SEED AND SENTINEL LYMPH NODE BIOPSY;  Surgeon: Jovita Kussmaul, MD;  Location: [place];  Service: General;  Laterality: Right;  ? BREAST SURGERY  2019  ? CATARACT EXTRACTION W/PHACO Left 02/11/2017  ? Procedure: CATARACT EXTRACTION PHACO AND INTRAOCULAR LENS PLACEMENT ([facility]);  Surgeon: Leandrew Koyanagi, MD;  Location: [facility];  Service: Ophthalmology;  Laterality: Left;  [place] 1:02.8 ?AP% 13.7 ?CDE 8.58 ?FLUID PACK LOT # [id number]  ? CATARACT EXTRACTION W/PHACO Right 03/10/2017  ? Procedure: CATARACT EXTRACTION PHACO AND INTRAOCULAR LENS PLACEMENT ([place]) Right symfony toric lens;  Surgeon: Leandrew Koyanagi, MD;  Location: [place];  Service: Ophthalmology;  Laterality: Right;  symfony toric lens ?prefers early  ? CT of chest  10/09  ? bronchiectasis  ? ENDOMETRIAL BIOPSY    ? LAPAROSCOPY  1984  ? ?Family History  ?Problem Relation Age of Onset  ? Cancer Mother   ?     pancreatic, liver  ? Cancer Father   ?     colon  ? Colon cancer Father 47  ? Thyroid disease Sister   ? Colon polyps Sister   ? Irritable bowel syndrome Sister   ? Breast cancer Sister 17  ? Melanoma Sister 58  ? Thyroid disease Sister   ? Thyroid disease Sister   ? Skin cancer Brother   ? Breast cancer Maternal Aunt   ?     dx under 66  ? Glaucoma Maternal Aunt   ?  Colon cancer Paternal Grandmother   ?     dx in her 8s  ? Thyroid cancer Paternal Grandmother   ?     dx in her 19s  ? Miscarriages / [place] Cousin   ? Diabetes Cousin   ? Breast cancer Cousin 21  ? Heart disease Other   ? Esophageal cancer Neg Hx   ? Stomach cancer Neg Hx   ? Rectal cancer Neg Hx   ? ?Social History  ? ?Socioeconomic History  ? Marital status: Married  ?  Spouse name: Not on file  ? Number of children: 1  ? Years of education: Not on file  ? Highest education level: Not on file  ?Occupational History  ? Occupation: Unemployed  ?  Employer: UNEMPLOYED  ?Tobacco Use  ? Smoking status: Every Day  ?  Packs/day: 1.00  ?  Years: 56.00  ?  Pack years: 56.00  ?  Types: Cigarettes  ? Smokeless tobacco: Never  ?Vaping Use  ? Vaping Use: Never used  ?Substance and Sexual Activity  ? Alcohol use: Not Currently  ? Drug use: No  ? Sexual activity: Yes  ?Other Topics Concern  ? Not on file  ?Social History Narrative  ? Daily Caffeine Use:  2 daily  ? Son in [place]  ? Sister in [place]  ? ?Social Determinants of Health  ? ?Financial Resource Strain: Low Risk   ? Difficulty of Paying Living Expenses: Not hard at all  ?Food Insecurity: No Food Insecurity  ? Worried About Charity fundraiser in the Last Year: Never true  ? Ran Out of Food in the Last Year: Never true   ?Transportation Needs: No Transportation Needs  ? Lack of Transportation (Medical): No  ? Lack of Transportation (Non-Medical): No  ?Physical Activity: Inactive  ? Days of Exercise per Week: 0 days  ? Minutes of Exer

## 2022-01-02 ENCOUNTER — Ambulatory Visit (INDEPENDENT_AMBULATORY_CARE_PROVIDER_SITE_OTHER): Payer: Medicare Other | Admitting: Family Medicine

## 2022-01-02 ENCOUNTER — Other Ambulatory Visit: Payer: Self-pay

## 2022-01-02 ENCOUNTER — Encounter: Payer: Self-pay | Admitting: Family Medicine

## 2022-01-02 VITALS — BP 130/78 | HR 76 | Temp 98.0°F | Ht 62.0 in | Wt 105.1 lb

## 2022-01-02 DIAGNOSIS — R7303 Prediabetes: Secondary | ICD-10-CM

## 2022-01-02 DIAGNOSIS — M5431 Sciatica, right side: Secondary | ICD-10-CM | POA: Diagnosis not present

## 2022-01-02 DIAGNOSIS — E039 Hypothyroidism, unspecified: Secondary | ICD-10-CM | POA: Diagnosis not present

## 2022-01-02 DIAGNOSIS — E038 Other specified hypothyroidism: Secondary | ICD-10-CM

## 2022-01-02 DIAGNOSIS — M8589 Other specified disorders of bone density and structure, multiple sites: Secondary | ICD-10-CM

## 2022-01-02 DIAGNOSIS — E2839 Other primary ovarian failure: Secondary | ICD-10-CM | POA: Diagnosis not present

## 2022-01-02 DIAGNOSIS — F418 Other specified anxiety disorders: Secondary | ICD-10-CM

## 2022-01-02 DIAGNOSIS — E7800 Pure hypercholesterolemia, unspecified: Secondary | ICD-10-CM

## 2022-01-02 DIAGNOSIS — J301 Allergic rhinitis due to pollen: Secondary | ICD-10-CM

## 2022-01-02 DIAGNOSIS — Z1283 Encounter for screening for malignant neoplasm of skin: Secondary | ICD-10-CM

## 2022-01-02 DIAGNOSIS — Z1211 Encounter for screening for malignant neoplasm of colon: Secondary | ICD-10-CM

## 2022-01-02 MED ORDER — MONTELUKAST SODIUM 10 MG PO TABS: 10.0000 mg | ORAL_TABLET | Freq: Every day | ORAL | 3 refills | Status: DC

## 2022-01-02 MED ORDER — CYCLOBENZAPRINE HCL 10 MG PO TABS: 10.0000 mg | ORAL_TABLET | Freq: Every evening | ORAL | 1 refills | Status: DC | PRN

## 2022-01-02 MED ORDER — ESCITALOPRAM OXALATE 20 MG PO TABS
20.0000 mg | ORAL_TABLET | Freq: Every day | ORAL | 3 refills | Status: DC
Start: 2022-01-02 — End: 2022-12-07

## 2022-01-02 NOTE — Progress Notes (Signed)
? ?Subjective:  ? ? Patient ID: Cheryl Aguirre, female    DOB: 1946-03-30, 76 y.o.   MRN: [id number] ? ?This visit occurred during the SARS-CoV-2 public health emergency.  Safety protocols were in place, including screening questions prior to the visit, additional usage of staff PPE, and extensive cleaning of exam room while observing appropriate contact time as indicated for disinfecting solutions.  ? ?HPI ?Pt presents for annual follow up of chronic medical problems  ? ?Wt Readings from Last 3 Encounters:  ?01/02/22 105 lb 2 oz (47.7 kg)  ?12/31/21 108 lb (49 kg)  ?12/29/21 108 lb 4.8 oz (49.1 kg)  ? ?19.23 kg/m? ? ?Amw rev from 3/22 ? ?Feeling pretty good  ? ?Colonoscopy 05/2021 with 3 y recall ?Father had colon cancer ? ?Mother had panc and liver cancer ?Sister had melanoma  ? ?Mammogram 08/2021  ?Self breast exam : no changes  ?Sister had breast cancer along with M aunt  ?Tamoxifen for breast cancer -visit with Dr Lindi Adie recently , 4th year  ?Gets to stop it in a year  ? ? ?Dexa 09/2019  osteopenia (on tamoxifen)  ?Falls -none ?Fractures -none  ?Ca and D  ?Exercise - walking  ? ?Zoster status : interested in shingrix  ? ?Smoking status  :  the same  ?Not ready to quit /will never be ready  ?Is in the lung cancer screening program  ?Breathing is fine  ?Some cough all the time  ? ? ? ?BP ?BP Readings from Last 3 Encounters:  ?01/02/22 130/78  ?12/29/21 (!) 142/64  ?06/25/21 118/74  ? ?Pulse Readings from Last 3 Encounters:  ?01/02/22 76  ?12/29/21 81  ?06/25/21 77  ? ? ? ?Subclinical hypothyroid  ?Now on med from endocrinology  ?Levothyroxine 25 mcg daily  ?Lab Results  ?Component Value Date  ? TSH 3.09 12/29/2021  ? ? ?Prediabetes ?Lab Results  ?Component Value Date  ? HGBA1C 5.9 12/29/2021  ? ?This is stable  ?Eating healthy  ? ?Hyperlipidemia ?Lab Results  ?Component Value Date  ? CHOL 167 12/29/2021  ? CHOL 162 01/06/2021  ? CHOL 164 12/13/2019  ? ?Lab Results  ?Component Value Date  ? HDL 73.00  12/29/2021  ? HDL 69.30 01/06/2021  ? HDL 70.00 12/13/2019  ? ?Lab Results  ?Component Value Date  ? [place] 79 12/29/2021  ? [place] 78 01/06/2021  ? [place] 80 12/13/2019  ? ?Lab Results  ?Component Value Date  ? TRIG 76.0 12/29/2021  ? TRIG 73.0 01/06/2021  ? TRIG 74.0 12/13/2019  ? ?Lab Results  ?Component Value Date  ? CHOLHDL 2 12/29/2021  ? CHOLHDL 2 01/06/2021  ? CHOLHDL 2 12/13/2019  ? ?Lab Results  ?Component Value Date  ? LDLDIRECT 138.2 11/01/2013  ? LDLDIRECT 109.6 10/26/2012  ? ?Diet controlled  ? ?Lab Results  ?Component Value Date  ? CREATININE 0.81 12/29/2021  ? BUN 14 12/29/2021  ? NA 139 12/29/2021  ? K 4.4 12/29/2021  ? CL 103 12/29/2021  ? CO2 31 12/29/2021  ? ?Lab Results  ?Component Value Date  ? ALT 11 12/29/2021  ? AST 21 12/29/2021  ? ALKPHOS 65 12/29/2021  ? BILITOT 0.4 12/29/2021  ?  ?Has not been to derm lately  ?Due for a visit  ? ?Patient Active Problem List  ? Diagnosis Date Noted  ? Skin cancer screening 01/02/2022  ? Fatigue 12/28/2021  ? Dysphagia 01/08/2021  ? Allergic rhinitis 01/08/2021  ? Flank pain 01/08/2021  ? Hypothyroid 12/29/2019  ?  Genetic testing 03/30/2018  ? Family history of breast cancer   ? Family history of thyroid cancer   ? Family history of pancreatic cancer   ? Malignant neoplasm of lower-inner quadrant of right breast of female, estrogen receptor positive ([place]) 02/08/2018  ? Estrogen deficiency 11/23/2016  ? Prediabetes 11/14/2016  ? Screening for thyroid disorder 11/14/2016  ? Family history of colon cancer 11/22/2015  ? Colon cancer screening 11/22/2015  ? Encounter for Medicare annual wellness exam 10/31/2013  ? Headache(784.0) 11/01/2012  ? Gynecologic exam normal 09/15/2011  ? Routine general medical examination at a health care facility 09/07/2011  ? ONYCHOMYCOSIS 07/24/2010  ? PURE HYPERCHOLESTEROLEMIA 07/21/2010  ? COLONIC POLYPS, HYPERPLASTIC, HX OF 02/18/2010  ? Nonspecific (abnormal) findings on radiological and other examination of body structure  08/02/2008  ? Major LUNG FIELD 08/02/2008  ? Osteopenia 07/20/2007  ? TOBACCO ABUSE 02/11/2007  ? Depression with anxiety 02/11/2007  ? GERD 02/11/2007  ? Fibrocystic breast changes 02/11/2007  ? ?Past Medical History:  ?Diagnosis Date  ? Anxiety   ? Arthritis   ? left hip  ? Breast cancer ([place])   ? Cancer [facility]) 2019  ? breast  ? Chronic headaches   ? Colon polyps   ? Depression   ? Family history of breast cancer   ? Family history of colon cancer   ? Family history of pancreatic cancer   ? Family history of thyroid cancer   ? GERD (gastroesophageal reflux disease)   ? Hypertension   ? elevated at times.  no meds  ? IBS (irritable bowel syndrome)   ? Insomnia   ? Osteopenia   ? Personal history of radiation therapy   ? 2019  ? Tobacco abuse   ? ?Past Surgical History:  ?Procedure Laterality Date  ? ABDOMINAL HYSTERECTOMY    ? bleeding, fibroid, endometriosis (total)  ? BASAL CELL CARCINOMA EXCISION  11/22/2018  ? BASAL CELL CARCINOMA EXCISION  07/12/2018  ? BREAST LUMPECTOMY Right 01/2018  ? BREAST LUMPECTOMY WITH RADIOACTIVE SEED AND SENTINEL LYMPH NODE BIOPSY Right 01/31/2018  ? Procedure: BREAST LUMPECTOMY WITH RADIOACTIVE SEED AND SENTINEL LYMPH NODE BIOPSY;  Surgeon: Jovita Kussmaul, MD;  Location: [place];  Service: General;  Laterality: Right;  ? BREAST SURGERY  2019  ? CATARACT EXTRACTION W/PHACO Left 02/11/2017  ? Procedure: CATARACT EXTRACTION PHACO AND INTRAOCULAR LENS PLACEMENT (IOC);  Surgeon: Leandrew Koyanagi, MD;  Location: [facility];  Service: Ophthalmology;  Laterality: Left;  [place] 1:02.8 ?AP% 13.7 ?CDE 8.58 ?FLUID PACK LOT # [id number]  ? CATARACT EXTRACTION W/PHACO Right 03/10/2017  ? Procedure: CATARACT EXTRACTION PHACO AND INTRAOCULAR LENS PLACEMENT ([place]) Right symfony toric lens;  Surgeon: Leandrew Koyanagi, MD;  Location: [place];  Service: Ophthalmology;  Laterality: Right;  symfony toric lens ?prefers early  ? CT of chest  10/09  ? bronchiectasis  ?  ENDOMETRIAL BIOPSY    ? LAPAROSCOPY  1984  ? ?Social History  ? ?Tobacco Use  ? Smoking status: Every Day  ?  Packs/day: 1.00  ?  Years: 56.00  ?  Pack years: 56.00  ?  Types: Cigarettes  ? Smokeless tobacco: Never  ?Vaping Use  ? Vaping Use: Never used  ?Substance Use Topics  ? Alcohol use: Not Currently  ? Drug use: No  ? ?Family History  ?Problem Relation Age of Onset  ? Cancer Mother   ?     pancreatic, liver  ? Cancer Father   ?  colon  ? Colon cancer Father 55  ? Thyroid disease Sister   ? Colon polyps Sister   ? Irritable bowel syndrome Sister   ? Breast cancer Sister 15  ? Melanoma Sister 61  ? Thyroid disease Sister   ? Thyroid disease Sister   ? Skin cancer Brother   ? Breast cancer Maternal Aunt   ?     dx under 66  ? Glaucoma Maternal Aunt   ? Colon cancer Paternal Grandmother   ?     dx in her 15s  ? Thyroid cancer Paternal Grandmother   ?     dx in her 61s  ? Miscarriages / [place] Cousin   ? Diabetes Cousin   ? Breast cancer Cousin 16  ? Heart disease Other   ? Esophageal cancer Neg Hx   ? Stomach cancer Neg Hx   ? Rectal cancer Neg Hx   ? ?Allergies  ?Allergen Reactions  ? Adhesive [Tape]   ?  Tears skin  ? Alendronate Sodium   ?  REACTION: muscle, joint pain, indigestion  ? Penicillins Other (See Comments)  ?  Has patient had a PCN reaction causing immediate rash, facial/tongue/throat swelling, SOB or lightheadedness with hypotension: yes ?Has patient had a PCN reaction causing severe rash involving mucus membranes or skin necrosis: no ?Has patient had a PCN reaction that required hospitalization: no ?Has patient had a PCN reaction occurring within the last 10 years: no ?If all of the above answers are "NO", then may proceed with Cephalosporin use. ?**CHILDHOOD**  ? Varenicline Tartrate Nausea Only  ?     ? Doxycycline Nausea And Vomiting and Rash  ?     ? ?Current Outpatient Medications on File Prior to Visit  ?Medication Sig Dispense Refill  ? acetaminophen (TYLENOL) 500 MG tablet Take 500  mg by mouth every 8 (eight) hours as needed.    ? calcium carbonate (TUMS - DOSED IN MG ELEMENTAL CALCIUM) 500 MG chewable tablet Chew 1 tablet by mouth as needed for indigestion or heartburn.    ? cetirizine (Z

## 2022-01-02 NOTE — Patient Instructions (Addendum)
If you are interested in the new shingles vaccine (Shingrix) - call your local pharmacy to check on coverage and availability  ?If affordable, get on a wait list at your pharmacy to get the vaccine. ? ?Call the [facility] office for dermatology visit ? ?Call and schedule your bone density test  ? ?Please call the location of your choice from the menu below to schedule your Mammogram and/or Bone Density appointment.   ? ?[place]  ? ?[facility]                ?      Phone:  [phone] ?[address]                               ?[place], [place] [address]                                                             ?Services: Traditional and 3D Mammogram, Bone Density  ? ?[facility]           ?      Phone: [phone] ?[address]                                                       ?[place], [place] [address]    ?Service: Bone Density ONLY  ? *this site does NOT perform mammograms ? ?[place]                       ? Phone:  [phone] ?[address]                                  ?[place], [place] [address]                                            ?Services:  3D Mammogram and Bone Density  ? ? ?[place] ? ?[place] [facility]   ?Phone:  [phone]   ?[place], [place] [address]                                            ?Services: 3D Mammogram and Bone Density ? ?[place] [facility])  ?Phone:  [phone]   ?[address]                        ?[place], [place] [address]                                              ?Services:  3D Mammogram and Bone Density ? ? ? ? ?

## 2022-01-04 NOTE — Assessment & Plan Note (Signed)
dexa due/last 2020 ?Ordered ?No falls or fx ?Taking ca and D and walking ?No change in smoking ?

## 2022-01-04 NOTE — Assessment & Plan Note (Signed)
Disc goals for lipids and reasons to control them ?Rev last labs with pt ?Rev low sat fat diet in detail ?Diet controlled ?LDL is 79 ?

## 2022-01-04 NOTE — Assessment & Plan Note (Signed)
Lab Results  ?Component Value Date  ? HGBA1C 5.9 12/29/2021  ? ?disc imp of low glycemic diet and wt loss to prevent DM2  ?

## 2022-01-04 NOTE — Assessment & Plan Note (Signed)
Patient has family history of melanoma ?Encouraged sun protection ?We will schedule an appointment at her dermatologist for screening ?

## 2022-01-04 NOTE — Assessment & Plan Note (Signed)
Hypothyroidism  ?Pt has no clinical changes ?No change in energy level/ hair or skin/ edema and no tremor ?Lab Results  ?Component Value Date  ? TSH 3.09 12/29/2021  ?  ?Taking levothyroxine 25 mcg daily ?

## 2022-01-04 NOTE — Assessment & Plan Note (Signed)
Mood is good lately ?Patient continues Lexapro 20 mg daily which works well for her ?

## 2022-01-04 NOTE — Assessment & Plan Note (Signed)
Colonoscopy in 8 of 2022 with a 3-year recall ?Her father had colon cancer ?

## 2022-01-04 NOTE — Assessment & Plan Note (Signed)
singulair refilled 10 mg daily ?Takes zyrtec ?Avoids pollen/allergens when able ?

## 2022-01-09 ENCOUNTER — Encounter: Payer: Self-pay | Admitting: *Deleted

## 2022-02-13 DIAGNOSIS — Z20822 Contact with and (suspected) exposure to covid-19: Secondary | ICD-10-CM | POA: Diagnosis not present

## 2022-02-17 DIAGNOSIS — Z961 Presence of intraocular lens: Secondary | ICD-10-CM | POA: Diagnosis not present

## 2022-02-27 ENCOUNTER — Ambulatory Visit
Admission: RE | Admit: 2022-02-27 | Discharge: 2022-02-27 | Disposition: A | Discharge status: Home / Self Care | Payer: Medicare Other | Source: Ambulatory Visit | Attending: Family Medicine | Admitting: Family Medicine

## 2022-02-27 DIAGNOSIS — Z87891 Personal history of nicotine dependence: Secondary | ICD-10-CM

## 2022-02-27 DIAGNOSIS — F17210 Nicotine dependence, cigarettes, uncomplicated: Secondary | ICD-10-CM | POA: Diagnosis not present

## 2022-03-04 ENCOUNTER — Telehealth: Payer: Self-pay | Admitting: Acute Care

## 2022-03-04 DIAGNOSIS — F17200 Nicotine dependence, unspecified, uncomplicated: Secondary | ICD-10-CM

## 2022-03-04 DIAGNOSIS — Z87891 Personal history of nicotine dependence: Secondary | ICD-10-CM

## 2022-03-04 DIAGNOSIS — Z122 Encounter for screening for malignant neoplasm of respiratory organs: Secondary | ICD-10-CM

## 2022-03-04 DIAGNOSIS — R911 Solitary pulmonary nodule: Secondary | ICD-10-CM

## 2022-03-04 NOTE — Telephone Encounter (Signed)
I have attempted to call the patient with the results of their  Low Dose CT Chest Lung cancer screening scan. There was no answer. I was unable to leave a message as the phone when answered was attached to a fax line. I will attempt to call again tomorrow morning.  She will need a 3 month follow up, and an appointment with me prior to her follow up scan to ensure she is not sick.

## 2022-03-05 NOTE — Telephone Encounter (Signed)
Patient is returning phone call. Patient phone number is [phone].

## 2022-03-05 NOTE — Telephone Encounter (Signed)
I have attempted to call the patient with the results of their  Low Dose CT Chest Lung cancer screening scan. There was no answer. I have left a HIPPA compliant VM requesting the patient call the office for the scan results. I included the office contact information in the message. We will await his return call. If no return call we will continue to call until patient is contacted.  This was the second call . I was able to leave a message today, with the office contact number. If patient does not return call we will try again 5/26.

## 2022-03-06 NOTE — Telephone Encounter (Signed)
I have called the patient with the results of her low-dose screening CT.  I explained that her scan was read as a lung RADS 4A, there are multiple new lung nodules measuring up to 7.5 mm that will need to be reevaluated in 3 months.  Patient states that she has had increase in mucus production.  CT shows concern for bronchiectasis, which patient is not actively treating on a daily basis.  I discussed with her coming into the office to see Dr. Silas Flood again.  She stated she would prefer to wait until after the 63-monthfollow-up scan is done so that we can reevaluate the area at that time.  She states if there is still mucoid impaction and increased mucus production she will be agreeable to an office visit with sputum culture.  (There is notation of concern for MAI) [name] please place order for 338-monthow-dose CT follow-up scan and fax results to patient's PCP Dr. MaLoura Pardon

## 2022-03-06 NOTE — Telephone Encounter (Signed)
Results faxed to PCP w/plan.  New order placed for 3 month follow up nodule CT chest

## 2022-07-07 ENCOUNTER — Ambulatory Visit
Admission: RE | Admit: 2022-07-07 | Discharge: 2022-07-07 | Disposition: A | Discharge status: Home / Self Care | Payer: Medicare Other | Source: Ambulatory Visit | Attending: Acute Care | Admitting: Acute Care

## 2022-07-07 DIAGNOSIS — Z87891 Personal history of nicotine dependence: Secondary | ICD-10-CM

## 2022-07-07 DIAGNOSIS — F17200 Nicotine dependence, unspecified, uncomplicated: Secondary | ICD-10-CM

## 2022-07-07 DIAGNOSIS — Z122 Encounter for screening for malignant neoplasm of respiratory organs: Secondary | ICD-10-CM

## 2022-07-07 DIAGNOSIS — R911 Solitary pulmonary nodule: Secondary | ICD-10-CM

## 2022-07-07 DIAGNOSIS — J470 Bronchiectasis with acute lower respiratory infection: Secondary | ICD-10-CM | POA: Diagnosis not present

## 2022-07-09 ENCOUNTER — Other Ambulatory Visit: Payer: Self-pay | Admitting: Hematology and Oncology

## 2022-07-09 DIAGNOSIS — R928 Other abnormal and inconclusive findings on diagnostic imaging of breast: Secondary | ICD-10-CM

## 2022-07-10 ENCOUNTER — Telehealth: Payer: Self-pay | Admitting: Acute Care

## 2022-07-10 DIAGNOSIS — Z87891 Personal history of nicotine dependence: Secondary | ICD-10-CM

## 2022-07-10 DIAGNOSIS — R911 Solitary pulmonary nodule: Secondary | ICD-10-CM

## 2022-07-10 NOTE — Telephone Encounter (Signed)
See previous telephone note. 

## 2022-07-10 NOTE — Telephone Encounter (Signed)
I have called the patient with the results of her low dose CT Chest. Her scan was read as  a LR 0.   Nodules which were new on prior exam have resolved, however there are new scattered solid pulmonary nodules, largest measures 9 mm, likely related to chronic atypical infection. Recommend follow-up chest CT in 3 months to ensure resolution. Lung-RADS 0, incomplete. Additional lung cancer screening CT images/or comparison to prior chest CT examinations is needed. 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 Severe coronary artery calcifications of the LAD, recommend ASCVD risk assessment. Aortic Atherosclerosis (ICD10-I70.0) and Emphysema (ICD10-J43.9).  Pt. Was seen by Dr. Silas Flood 04/04/2021. She has had sputum cultures done. She did not follow up with Dr. Silas Flood at 3 months per the after visit summary. Repeat scan shows continued concern for bronchiectasis as well as additional new nodules. .  We will re scan in 3 months, and I will have the office schedule her with Dr. Silas Flood at his earliest available appointment.  [name], 3 month follow up and she needs to be seen by Dr. Silas Flood at his earliest available. Can you get her on his schedule?  Please fax results to PCP. Let her know we will do a 3 month follow up scan and get her in with Dr. Silas Flood  Thanks so much

## 2022-07-13 NOTE — Telephone Encounter (Signed)
CT results faxed to PCP with follow up plans included. Order placed for 3 mth follow up Chest CT. Spoke with pt and scheduled her to see Dr Silas Flood 07/13/2022.

## 2022-07-13 NOTE — Telephone Encounter (Signed)
Please let pt know that it looks like her CT showed some evidence of coronary artery disease in one vessel. I would like her to see cardiology for a risk assessment. Let me know if she is open to that

## 2022-07-14 ENCOUNTER — Encounter: Payer: Self-pay | Admitting: Pulmonary Disease

## 2022-07-14 ENCOUNTER — Ambulatory Visit (INDEPENDENT_AMBULATORY_CARE_PROVIDER_SITE_OTHER): Payer: Medicare Other | Admitting: Pulmonary Disease

## 2022-07-14 VITALS — BP 118/68 | HR 93 | Ht 63.0 in | Wt 102.0 lb

## 2022-07-14 DIAGNOSIS — J471 Bronchiectasis with (acute) exacerbation: Secondary | ICD-10-CM

## 2022-07-14 MED ORDER — LEVOFLOXACIN 500 MG PO TABS: 500.0000 mg | ORAL_TABLET | Freq: Every day | ORAL | 0 refills | Status: AC

## 2022-07-14 NOTE — Progress Notes (Signed)
$'@Patient'R$  ID: Cheryl Aguirre, female    DOB: 07/10/1946, 76 y.o.   MRN: [id number]  Chief Complaint  Patient presents with   Follow-up    Follow up to go over CT scan results from scan last month. And patient would also like to go over the last work from last year when she saw [facility]. She states May of last year.     Referring provider: Abner Greenspan, MD  HPI:   76 y.o. whom we are seeing in follow up for evaluation of bronchiectasis.  Most recent endocrinology note reviewed.  Most recent GI note reviewed.  Most recent PCP note reviewed.  Returns after prolonged absence from clinic.  Had intended to see her back in the fall 2023 but follow-up was never scheduled.  Cough is essentially unchanged.  Ongoing weight loss.  Feels like cough was may be a bit worse over the summer compared to baseline.  She is not using the hypertonic saline for airway clearance.  Cannot tolerate it.  Did not like the way it made her feel.  We discussed at length and in great detail the importance of airway clearance the treatment of bronchiectasis.  We discussed the need for intermittent antibiotic courses as well.  Reviewed results of test at last visit which revealed normal alpha-1 antitrypsin genotype and levels, normal IgG, IgM, IgA levels.  Normal CF mutation panel.  Sputum culture revealed OP flora, pansensitive Klebsiella pneumoniae, pansensitive Pseudomonas aeruginosa.  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  HPI at initial visit: Patient notes a remarkably long history of recurrent upper respiratory illnesses.  Bouts of bronchitis, pneumonia etc.  Recalls a time as a child but was almost admitted to the hospital.  Again is occurred throughout her life.  Has productive cough daily.  Seems worse over the last several months.  Received Z-Pak x2 with mild improvement in symptoms on the temporary basis.  No time during day with things are better or worse.  No seasonal or environmental factors she  can identify.  No other alleviating or exacerbating factors she can identify.  Reviewed extensively past CT scans most remote 2009 of the monitor patient reveals most prominently in the lingula and right middle lobe bronchiectatic changes with mild scarring likely the postinflammatory condition/pneumonia but scattered more mild bronchiectasis in the upper lobes and lower lobes bilaterally on my interpretation.  Reviewed lung cancer CT scan 11/13/2019 and 1 more recently 2022 that shows progression of diffuse bilateral bronchiectasis most prominent in the middle lobe and lingula and lower lobes but significant in the upper lobes as well with scattered mucus impaction on my interpretation.  No history of diabetes.  No history of recurrent diarrhea.  She does have sinus disease or sinus congestion.  History of seasonal allergies although not super severe.  More deep congestion, in the sinuses the posterior rhinorrhea, postnasal drip.  PMH: Hypothyroidism, GERD, breast cancer currently on tamoxifen therapy Surgical history: Hysterectomy, breast surgery 2019 Family history: Cancer runs in family, no first-degree relatives with respiratory illnesses Social history: Current everyday smoker, 1 pack a day, 60-pack-year history, lives in [place] / Pulmonary Flowsheets:   ACT:      No data to display          MMRC:     No data to display          Epworth:      No data to display          Tests:  FENO:  No results found for: "NITRICOXIDE"  PFT:     No data to display          WALK:      No data to display          Imaging: Personally reviewed and as per EMR  Lab Results: Personally reviewed CBC    Component Value Date/Time   WBC 6.1 12/29/2021 1109   RBC 4.11 12/29/2021 1109   HGB 13.5 12/29/2021 1109   HCT 39.8 12/29/2021 1109   PLT 231.0 12/29/2021 1109   MCV 96.9 12/29/2021 1109   MCH 31.5 01/21/2018 1012   MCHC 33.8 12/29/2021 1109    RDW 13.5 12/29/2021 1109   LYMPHSABS 2.2 12/29/2021 1109   MONOABS 0.7 12/29/2021 1109   EOSABS 0.2 12/29/2021 1109   BASOSABS 0.0 12/29/2021 1109    BMET    Component Value Date/Time   NA 139 12/29/2021 1109   K 4.4 12/29/2021 1109   CL 103 12/29/2021 1109   CO2 31 12/29/2021 1109   GLUCOSE 97 12/29/2021 1109   BUN 14 12/29/2021 1109   CREATININE 0.81 12/29/2021 1109   CALCIUM 8.9 12/29/2021 1109   GFRNONAA >60 01/21/2018 1012   GFRAA >60 01/21/2018 1012    BNP No results found for: "BNP"  ProBNP No results found for: "PROBNP"  Specialty Problems       Pulmonary Problems   Allergic rhinitis    Allergies  Allergen Reactions   Adhesive [Tape]     Tears skin   Alendronate Sodium     REACTION: muscle, joint pain, indigestion   Penicillins Other (See Comments)    Has patient had a PCN reaction causing immediate rash, facial/tongue/throat swelling, SOB or lightheadedness with hypotension: yes Has patient had a PCN reaction causing severe rash involving mucus membranes or skin necrosis: no Has patient had a PCN reaction that required hospitalization: no Has patient had a PCN reaction occurring within the last 10 years: no If all of the above answers are "NO", then may proceed with Cephalosporin use. **CHILDHOOD**   Varenicline Tartrate Nausea Only        Doxycycline Nausea And Vomiting and Rash         Immunization History  Administered Date(s) Administered   Influenza Split 07/27/2011, 07/12/2013   Influenza Whole 08/08/2002, 07/20/2007   Influenza, High Dose Seasonal PF 08/01/2015, 07/12/2017, 07/22/2018, 07/17/2019, 07/09/2020   Influenza, Seasonal, Injecte, Preservative Fre 08/04/2016   Influenza-Unspecified 07/13/2014, 08/14/2021   PFIZER(Purple Top)SARS-COV-2 Vaccination 01/04/2020, 01/25/2020   Pneumococcal Conjugate-13 11/19/2014   Pneumococcal Polysaccharide-23 05/01/2005, 11/01/2012   Td 03/13/1999, 07/24/2010   Zoster, Live 08/12/2014    Past  Medical History:  Diagnosis Date   Anxiety    Arthritis    left hip   Breast cancer ([place])    Cancer ([place]) 2019   breast   Chronic headaches    Colon polyps    Depression    Family history of breast cancer    Family history of colon cancer    Family history of pancreatic cancer    Family history of thyroid cancer    GERD (gastroesophageal reflux disease)    Hypertension    elevated at times.  no meds   IBS (irritable bowel syndrome)    Insomnia    Osteopenia    Personal history of radiation therapy    2019   Tobacco abuse     Tobacco History: Social History   Tobacco Use  Smoking Status Every Day  Packs/day: 1.00   Years: 56.00   Total pack years: 56.00   Types: Cigarettes  Smokeless Tobacco Never  Tobacco Comments   Pt states that she smokes 1 ppd. ALS 07/14/22   Ready to quit: Not Answered Counseling given: Not Answered Tobacco comments: Pt states that she smokes 1 ppd. ALS 07/14/22   Continue to not smoke  Outpatient Encounter Medications as of 07/14/2022  Medication Sig   acetaminophen (TYLENOL) 500 MG tablet Take 500 mg by mouth every 8 (eight) hours as needed.   calcium carbonate (TUMS - DOSED IN MG ELEMENTAL CALCIUM) 500 MG chewable tablet Chew 1 tablet by mouth as needed for indigestion or heartburn.   cetirizine (ZYRTEC) 10 MG tablet Take 10 mg by mouth daily.   cyclobenzaprine (FLEXERIL) 10 MG tablet Take 1 tablet (10 mg total) by mouth at bedtime as needed (migraine).   escitalopram (LEXAPRO) 20 MG tablet Take 1 tablet (20 mg total) by mouth daily.   famotidine (PEPCID) 20 MG tablet Take 1 tablet (20 mg total) by mouth 2 (two) times daily.   fish oil-omega-3 fatty acids 1000 MG capsule Take 1 g by mouth daily.   Lactobacillus (ACIDOPHILUS) 100 MG CAPS Take 1 capsule by mouth daily.   levofloxacin (LEVAQUIN) 500 MG tablet Take 1 tablet (500 mg total) by mouth daily for 14 days.   levothyroxine (SYNTHROID) 25 MCG tablet Take 1 tablet (25 mcg total) by  mouth daily before breakfast.   Magnesium 250 MG TABS Take 1 tablet by mouth daily as needed (constipation).    Melatonin 5 MG TABS Take 5 mg by mouth at bedtime. As directed at bedtime.   montelukast (SINGULAIR) 10 MG tablet Take 1 tablet (10 mg total) by mouth at bedtime.   Multiple Vitamin (MULTIVITAMIN) tablet Take 1 tablet by mouth daily.   pantoprazole (PROTONIX) 40 MG tablet Take 1 tablet (40 mg total) by mouth daily.   sodium chloride HYPERTONIC 3 % nebulizer solution Take by nebulization as needed for other.   tamoxifen (NOLVADEX) 20 MG tablet TAKE 1 TABLET DAILY   Turmeric 500 MG CAPS Take 1 tablet by mouth daily.   Vitamin D-Vitamin K (VITAMIN K2-VITAMIN D3 PO) Take 1 capsule by mouth daily.   Facility-Administered Encounter Medications as of 07/14/2022  Medication   0.9 %  sodium chloride infusion     Review of Systems  Review of Systems  N/a  Physical Exam  BP 118/68 (BP Location: Right Arm, Patient Position: Sitting, Cuff Size: Normal)   Pulse 93   Ht '5\' 3"'$  (1.6 m)   Wt 102 lb (46.3 kg)   SpO2 96%   BMI 18.07 kg/m   Wt Readings from Last 5 Encounters:  07/14/22 102 lb (46.3 kg)  01/02/22 105 lb 2 oz (47.7 kg)  12/31/21 108 lb (49 kg)  12/29/21 108 lb 4.8 oz (49.1 kg)  06/25/21 106 lb 6.4 oz (48.3 kg)    BMI Readings from Last 5 Encounters:  07/14/22 18.07 kg/m  01/02/22 19.23 kg/m  12/31/21 19.75 kg/m  12/29/21 19.81 kg/m  06/25/21 19.46 kg/m     Physical Exam General: Well-appearing, in no acute distress Eyes: EOMI, no icterus Neck: Supple, no JVP appreciated Cardiovascular: Regular rate and rhythm, no murmur Pulmonary: Clear to auscultation bilaterally, normal work of breathing, diminished in the bases Abdomen: Nondistended, bowel sounds present MSK: No synovitis, no joint effusion Neuro: Normal gait, no weakness Psych: Normal mood, full affect   Assessment & Plan:   Bronchiectasis with  acute exacerbation: Present since at least 2009  as seen on CT scan.  Progressively worsening over time on serial images.  Suspect multifactorial related to high suspicion of NTM disease given the symptoms as well as more prominent lingula and right middle lobe pathology even back in 2009, chronic aspiration given her description of difficulty swallowing, related to recurrent infections given her description of multiple pneumonias bronchitis etc. from childhood on.  Low suspicion for PCD given no issues with infertility.  Given recurrent infections, work-up for immunodeficiency with immunoglobulins was pursued with normal levels  Alpha-1 antitrypsin was normal. CF mutation panel normal.  Respiratory culture reveals colonization with pansensitive Klebsiella pneumonia and Pseudomonas aeruginosa.  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  Not doing airway clearance.  New order for vest for chest physiotherapy today.  Given worsening cough over the summer, levofloxacin x14 days to treat Klebsiella and Pseudomonas on sputum culture.    Return in about 3 months (around 10/14/2022).   Lanier Clam, MD 07/14/2022   This appointment required 42 minutes of patient care (this includes precharting, chart review, review of results, face-to-face care, etc.).

## 2022-07-14 NOTE — Patient Instructions (Signed)
Nice to see you again  I called in antibiotic, levofloxacin.  Take 1 pill once a day for 14 days.  I hope this will help some with the cough and sputum production in the short-term.  I placed an order for a vest to be used twice a day.  This is a percussion vest to help mobilize sputum and hopefully improve symptoms over time.  Return to clinic in 3 months or sooner as needed with Dr. Silas Flood

## 2022-07-15 ENCOUNTER — Telehealth: Payer: Self-pay | Admitting: *Deleted

## 2022-07-15 DIAGNOSIS — E7800 Pure hypercholesterolemia, unspecified: Secondary | ICD-10-CM

## 2022-07-15 DIAGNOSIS — I2510 Atherosclerotic heart disease of native coronary artery without angina pectoris: Secondary | ICD-10-CM

## 2022-07-15 NOTE — Telephone Encounter (Signed)
Pt notified of CT results and Dr. Marliss Coots comments. Pt agrees with referral to Cardiologist. She would like to see someone within the [facility] system and she would like it to be in [place]. Pt advise PCP will put referral in and if she hasn't heard anything within 2 week to let us know.

## 2022-07-15 NOTE — Telephone Encounter (Addendum)
Please let pt know that it looks like her CT showed some evidence of coronary artery disease in one vessel. I would like her to see cardiology for a risk assessment. Let me know if she is open to that   ----- Message from Abner Greenspan, MD sent at 07/13/2022  7:11 PM EDT -----    ----- Message ----- From: Christie Beckers, RN Sent: 07/13/2022   8:56 AM EDT To: Abner Greenspan, MD  Pt is scheduled to see Dr Silas Flood to evaluate and treat. We will repeat CT in 3 monhts

## 2022-08-06 ENCOUNTER — Other Ambulatory Visit: Payer: Self-pay | Admitting: Internal Medicine

## 2022-08-09 NOTE — Progress Notes (Unsigned)
Cardiology Office Note   Date:  08/11/2022   ID:  [name], Cheryl Aguirre 08-Jul-1946, MRN [id number]  PCP:  Abner Greenspan, MD  Cardiologist:   Minus Breeding, MD Referring:  Tower, Wynelle Fanny, MD  Chief Complaint  Patient presents with   Shortness of Breath      History of Present Illness: Cheryl Aguirre is a 76 y.o. female who presents for elevated coronary calcium. She had a lung CA CT and was found to have elevated coronary calcium and aortic atherosclerosis.  This of course was not quantified but was said to be severe.  There was aortic atherosclerosis noted as well.  She did have some pulmonary nodules that are to be evaluated again apparently in 3 months per primary.    She has no past cardiac history.  However, she does have cardiovascular risk factors.  She does have shortness of breath with moderate activity.  This has been slowly progressive.  She does climb stairs at home but she is limited by joint pain and back pain.  She does not have resting shortness of breath, PND or orthopnea.  She does not have presyncope or syncope.  She will however occasionally have palpitations.  These have been chronic and she describes them as skipped beats.  She does not have any weight gain or edema.  She does not have chest pressure, neck or arm discomfort.   Past Medical History:  Diagnosis Date   Anxiety    Arthritis    left hip   Breast cancer (Cheryl Aguirre)    Cancer (Cheryl Aguirre) 2019   breast   Chronic headaches    Colon polyps    Depression    Family history of breast cancer    Family history of colon cancer    Family history of pancreatic cancer    Family history of thyroid cancer    GERD (gastroesophageal reflux disease)    Hypertension    elevated at times.  no meds   IBS (irritable bowel syndrome)    Insomnia    Osteopenia    Personal history of radiation therapy    2019   Tobacco abuse     Past Surgical History:  Procedure Laterality Date   ABDOMINAL HYSTERECTOMY      bleeding, fibroid, endometriosis (total)   BASAL CELL CARCINOMA EXCISION  11/22/2018   BASAL CELL CARCINOMA EXCISION  07/12/2018   BREAST LUMPECTOMY Right 01/2018   BREAST LUMPECTOMY WITH RADIOACTIVE SEED AND SENTINEL LYMPH NODE BIOPSY Right 01/31/2018   Procedure: BREAST LUMPECTOMY WITH RADIOACTIVE SEED AND SENTINEL LYMPH NODE BIOPSY;  Surgeon: Jovita Kussmaul, MD;  Location: [facility];  Service: General;  Laterality: Right;   BREAST SURGERY  2019   CATARACT EXTRACTION W/PHACO Left 02/11/2017   Procedure: CATARACT EXTRACTION PHACO AND INTRAOCULAR LENS PLACEMENT ([place]);  Surgeon: Leandrew Koyanagi, MD;  Location: [facility];  Service: Ophthalmology;  Laterality: Left;  [place] 1:02.8 AP% 13.7 CDE 8.58 FLUID PACK LOT # [id number]   CATARACT EXTRACTION W/PHACO Right 03/10/2017   Procedure: CATARACT EXTRACTION PHACO AND INTRAOCULAR LENS PLACEMENT ([facility]) Right symfony toric lens;  Surgeon: Leandrew Koyanagi, MD;  Location: [facility];  Service: Ophthalmology;  Laterality: Right;  symfony toric lens prefers early   CT of chest  10/09   bronchiectasis   ENDOMETRIAL BIOPSY     LAPAROSCOPY  1984     Current Outpatient Medications  Medication Sig Dispense Refill   acetaminophen (TYLENOL) 500 MG tablet Take 500 mg by  mouth every 8 (eight) hours as needed.     calcium carbonate (TUMS - DOSED IN MG ELEMENTAL CALCIUM) 500 MG chewable tablet Chew 1 tablet by mouth as needed for indigestion or heartburn.     cetirizine (ZYRTEC) 10 MG tablet Take 10 mg by mouth daily.     cyclobenzaprine (FLEXERIL) 10 MG tablet Take 1 tablet (10 mg total) by mouth at bedtime as needed (migraine). 90 tablet 1   escitalopram (LEXAPRO) 20 MG tablet Take 1 tablet (20 mg total) by mouth daily. 90 tablet 3   famotidine (PEPCID) 20 MG tablet Take 1 tablet (20 mg total) by mouth 2 (two) times daily. 180 tablet 3   fish oil-omega-3 fatty acids 1000 MG capsule Take 1 g by mouth daily.     Lactobacillus (ACIDOPHILUS) 100 MG CAPS  Take 1 capsule by mouth daily.     levothyroxine (SYNTHROID) 25 MCG tablet Take 1 tablet (25 mcg total) by mouth daily before breakfast. 90 tablet 3   Magnesium 250 MG TABS Take 1 tablet by mouth daily as needed (constipation).      Melatonin 5 MG TABS Take 5 mg by mouth at bedtime. As directed at bedtime.     montelukast (SINGULAIR) 10 MG tablet Take 1 tablet (10 mg total) by mouth at bedtime. 90 tablet 3   Multiple Vitamin (MULTIVITAMIN) tablet Take 1 tablet by mouth daily.     pantoprazole (PROTONIX) 40 MG tablet Take 1 tablet (40 mg total) by mouth daily. 90 tablet 3   rosuvastatin (CRESTOR) 10 MG tablet Take 1 tablet (10 mg total) by mouth daily. 90 tablet 3   sodium chloride HYPERTONIC 3 % nebulizer solution Take by nebulization as needed for other. 360 mL 12   tamoxifen (NOLVADEX) 20 MG tablet TAKE 1 TABLET DAILY 90 tablet 3   Turmeric 500 MG CAPS Take 1 tablet by mouth daily.     Vitamin D-Vitamin K (VITAMIN K2-VITAMIN D3 PO) Take 1 capsule by mouth daily.     Current Facility-Administered Medications  Medication Dose Route Frequency Provider Last Rate Last Admin   0.9 %  sodium chloride infusion  500 mL Intravenous Once Nandigam, Kavitha V, MD        Allergies:   Adhesive [tape], Alendronate sodium, Penicillins, Varenicline tartrate, and Doxycycline    Social History:  The patient  reports that she has been smoking cigarettes. She has a 56.00 pack-year smoking history. She has never used smokeless tobacco. She reports that she does not currently use alcohol. She reports that she does not use drugs.   Family History:  The patient's family history includes Breast cancer in her maternal aunt; Breast cancer (age of onset: 56) in her cousin; Breast cancer (age of onset: 16) in her sister; Cancer in her father and mother; Colon cancer in her paternal grandmother; Colon cancer (age of onset: 81) in her father; Colon polyps in her sister; Diabetes in her cousin; Glaucoma in her maternal aunt;  Heart disease in an other family member; Irritable bowel syndrome in her sister; Melanoma (age of onset: 4) in her sister; Miscarriages / Stillbirths in her cousin; Skin cancer in her brother; Thyroid cancer in her paternal grandmother; Thyroid disease in her sister, sister, and sister.    ROS:  Please see the history of present illness.   Otherwise, review of systems are positive for none.   All other systems are reviewed and negative.    PHYSICAL EXAM: VS:  BP 136/72 (BP Location: Left Arm,  Patient Position: Sitting, Cuff Size: Normal)   Pulse 79   Ht '5\' 2"'$  (1.575 m)   Wt 104 lb 3.2 oz (47.3 kg)   SpO2 96%   BMI 19.06 kg/m  , BMI Body mass index is 19.06 kg/m. GENERAL:  Well appearing HEENT:  Pupils equal round and reactive, fundi not visualized, oral mucosa unremarkable NECK:  No jugular venous distention, waveform within normal limits, carotid upstroke brisk and symmetric, positive right carotid bruits, no thyromegaly LYMPHATICS:  No cervical, inguinal adenopathy LUNGS:  Clear to auscultation bilaterally BACK:  No CVA tenderness CHEST:  Unremarkable HEART:  PMI not displaced or sustained,S1 and S2 within normal limits, no S3, no S4, no clicks, no rubs, no murmurs ABD:  Flat, positive bowel sounds normal in frequency in pitch, no bruits, no rebound, no guarding, no midline pulsatile mass, no hepatomegaly, no splenomegaly EXT:  2 plus pulses throughout, no edema, no cyanosis no clubbing SKIN:  No rashes no nodules NEURO:  Cranial nerves II through XII grossly intact, motor grossly intact throughout PSYCH:  Cognitively intact, oriented to person place and time    EKG:  EKG is ordered today. The ekg ordered today demonstrates sinus rhythm, rate 79, axis within normal limits, intervals within normal limits, poor anterior R wave progression, possible old anteroseptal MI.   Recent Labs: 12/29/2021: ALT 11; BUN 14; Creatinine, Ser 0.81; Hemoglobin 13.5; Platelets 231.0; Potassium 4.4;  Sodium 139; TSH 3.09    Lipid Panel    Component Value Date/Time   CHOL 167 12/29/2021 1109   TRIG 76.0 12/29/2021 1109   HDL 73.00 12/29/2021 1109   CHOLHDL 2 12/29/2021 1109   VLDL 15.2 12/29/2021 1109   LDLCALC 79 12/29/2021 1109   LDLDIRECT 138.2 11/01/2013 1018      Wt Readings from Last 3 Encounters:  08/11/22 104 lb 3.2 oz (47.3 kg)  07/14/22 102 lb (46.3 kg)  01/02/22 105 lb 2 oz (47.7 kg)      Other studies Reviewed: Additional studies/ records that were reviewed today include: CT.  Labs.. Review of the above records demonstrates:  Please see elsewhere in the note.     ASSESSMENT AND PLAN:  Coronary artery calcium.  Given the severe coronary calcium and shortness of breath with her decreased functional status and EKG I am going to screen her with a PET scan to rule out obstructive coronary disease.  She needs aggressive primary risk reduction  Aortic atherosclerosis: This will be treated with risk reduction  Shortness of breath: This could be related to some lung disease and longstanding smoking.  I will evaluate as above.  Dyslipidemia: I think her goal LDL should be in the 50s and I will start Crestor 10 mg daily.  Bruit: She will get carotid Dopplers.  Tobacco abuse: She understands need to stop smoking.  We talked about this as her most significant risk factor.   Current medicines are reviewed at length with the patient today.  The patient does not have concerns regarding medicines.  The following changes have been made:  no change  Labs/ tests ordered today include:   Orders Placed This Encounter  Procedures   NM PET CT CARDIAC PERFUSION MULTI W/ABSOLUTE BLOODFLOW   EKG 12-Lead   VAS US CAROTID     Disposition:   FU with me as needed based on the results of the above   Signed, Minus Breeding, MD  08/11/2022 4:40 PM    [place]

## 2022-08-11 ENCOUNTER — Encounter: Payer: Self-pay | Admitting: Cardiology

## 2022-08-11 ENCOUNTER — Ambulatory Visit: Payer: Medicare Other | Attending: Cardiology | Admitting: Cardiology

## 2022-08-11 VITALS — BP 136/72 | HR 79 | Ht 62.0 in | Wt 104.2 lb

## 2022-08-11 DIAGNOSIS — R0989 Other specified symptoms and signs involving the circulatory and respiratory systems: Secondary | ICD-10-CM

## 2022-08-11 DIAGNOSIS — I2510 Atherosclerotic heart disease of native coronary artery without angina pectoris: Secondary | ICD-10-CM

## 2022-08-11 DIAGNOSIS — R931 Abnormal findings on diagnostic imaging of heart and coronary circulation: Secondary | ICD-10-CM

## 2022-08-11 DIAGNOSIS — I700 Atherosclerosis of aorta: Secondary | ICD-10-CM

## 2022-08-11 DIAGNOSIS — R0602 Shortness of breath: Secondary | ICD-10-CM

## 2022-08-11 DIAGNOSIS — I2584 Coronary atherosclerosis due to calcified coronary lesion: Secondary | ICD-10-CM

## 2022-08-11 MED ORDER — ROSUVASTATIN CALCIUM 10 MG PO TABS: 10.0000 mg | ORAL_TABLET | Freq: Every day | ORAL | 3 refills | Status: DC

## 2022-08-11 NOTE — Patient Instructions (Addendum)
Medication Instructions:  START Rosuvastatin 10 mg once daily *If you need a refill on your cardiac medications before your next appointment, please call your pharmacy*   Lab Work: None ordered If you have labs (blood work) drawn today and your tests are completely normal, you will receive your results only by: [place] (if you have MyChart) OR A paper copy in the mail If you have any lab test that is abnormal or we need to change your treatment, we will call you to review the results.   Testing/Procedures: CARDIAC PET- Your physician has requested that you have a Cardiac Pet Stress Test. This testing is completed at [facility] ([place], [place] [address]). The schedulers will call you to get this scheduled. Please follow instructions below and call the office with any questions/concerns [phone]).  Your physician has requested that you have a carotid duplex. This test is an ultrasound of the carotid arteries in your neck. It looks at blood flow through these arteries that supply the brain with blood. Allow one hour for this exam. There are no restrictions or special instructions. This will take place at [place], [address].   Follow-Up: At [facility], you and your health needs are our priority.  As part of our continuing mission to provide you with exceptional heart care, we have created designated Provider Care Teams.  These Care Teams include your primary Cardiologist (physician) and Advanced Practice Providers (APPs -  Physician Assistants and Nurse Practitioners) who all work together to provide you with the care you need, when you need it.  We recommend signing up for the patient portal called "MyChart".  Sign up information is provided on this After Visit Summary.  MyChart is used to connect with patients for Virtual Visits (Telemedicine).  Patients are able to view lab/test results, encounter notes, upcoming  appointments, etc.  Non-urgent messages can be sent to your provider as well.   To learn more about what you can do with MyChart, go to [email or web address].    Your next appointment:   Follow up pending results Other Instructions How to Prepare for Your Cardiac PET/CT Stress Test:  1. Please do not take these medications before your test:   Medications that may interfere with the cardiac pharmacological stress agent (ex. nitrates - including erectile dysfunction medications or beta-blockers) the day of the exam. (Erectile dysfunction medication should be held for at least 72 hrs prior to test) Theophylline containing medications for 12 hours. Dipyridamole 48 hours prior to the test. Your remaining medications may be taken with water.  2. Nothing to eat or drink, except water, 3 hours prior to arrival time.   NO caffeine/decaffeinated products, or chocolate 12 hours prior to arrival.  3. NO perfume, cologne or lotion  4. Total time is 1 to 2 hours; you may want to bring reading material for the waiting time.  5. Please report to Admitting at the [facility] Main Entrance 60 minutes early for your test.  [place], [place] [id number]  Diabetic Preparation:  Hold oral medications. You may take NPH and Lantus insulin. Do not take Humalog or Humulin R (Regular Insulin) the day of your test. Check blood sugars prior to leaving the house. If able to eat breakfast prior to 3 hour fasting, you may take all medications, including your insulin, Do not worry if you miss your breakfast dose of insulin - start at your next meal.  IF YOU THINK YOU MAY BE PREGNANT, OR ARE NURSING PLEASE INFORM THE TECHNOLOGIST.  In preparation for your appointment, medication and supplies will be purchased.  Appointment availability is limited, so if you need to cancel or reschedule, please call the Radiology Department at [phone]  24 hours in advance to avoid a cancellation fee  of $100.00  What to Expect After you Arrive:  Once you arrive and check in for your appointment, you will be taken to a preparation room within the Radiology Department.  A technologist or Nurse will obtain your medical history, verify that you are correctly prepped for the exam, and explain the procedure.  Afterwards,  an IV will be started in your arm and electrodes will be placed on your skin for EKG monitoring during the stress portion of the exam. Then you will be escorted to the PET/CT scanner.  There, staff will get you positioned on the scanner and obtain a blood pressure and EKG.  During the exam, you will continue to be connected to the EKG and blood pressure machines.  A small, safe amount of a radioactive tracer will be injected in your IV to obtain a series of pictures of your heart along with an injection of a stress agent.    After your Exam:  It is recommended that you eat a meal and drink a caffeinated beverage to counter act any effects of the stress agent.  Drink plenty of fluids for the remainder of the day and urinate frequently for the first couple of hours after the exam.  Your doctor will inform you of your test results within 7-10 business days.  For questions about your test or how to prepare for your test, please call: Marchia Bond, Cardiac Imaging Nurse Navigator  Gordy Clement, Cardiac Imaging Nurse Navigator Office: [phone]

## 2022-08-12 IMAGING — CT CT CHEST LUNG CANCER SCREENING LOW DOSE W/O CM
1 series · 10 of 10 positions shown, 13 images · non-contrast
Comparison: 02/27/2021

CLINICAL DATA: Lung cancer screening. Seventy-two pack-year
history. Current asymptomatic smoker.



[ct lung segmentation data · axial · 0.62mm/px · z∈[-317,-317]mm · 10 of 316 frames shown]
[frame 1/316  mediastinal]
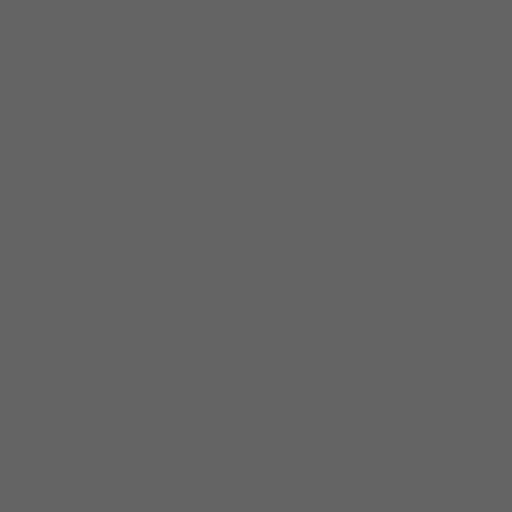
[frame 1/316  lung]
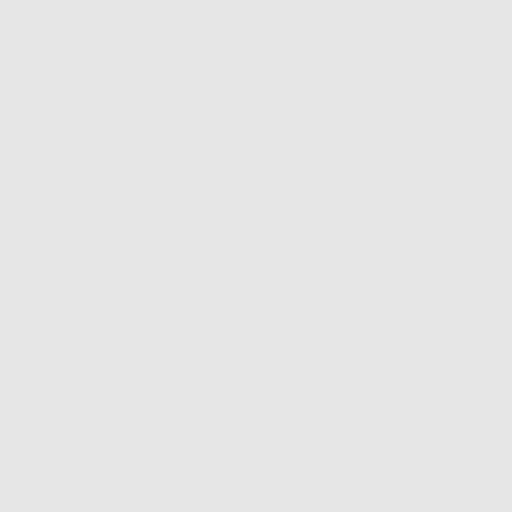
[frame 36/316  lung]
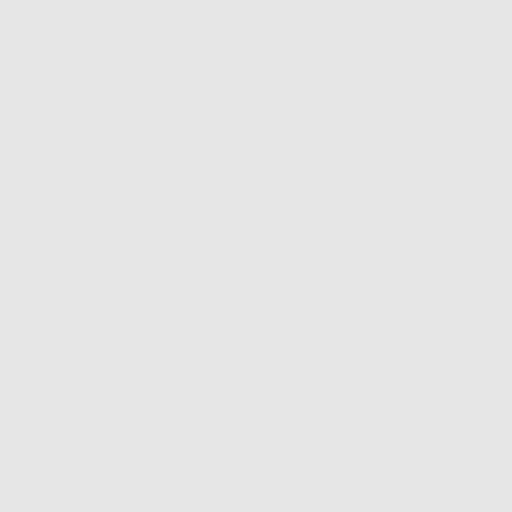
[frame 71/316  lung]
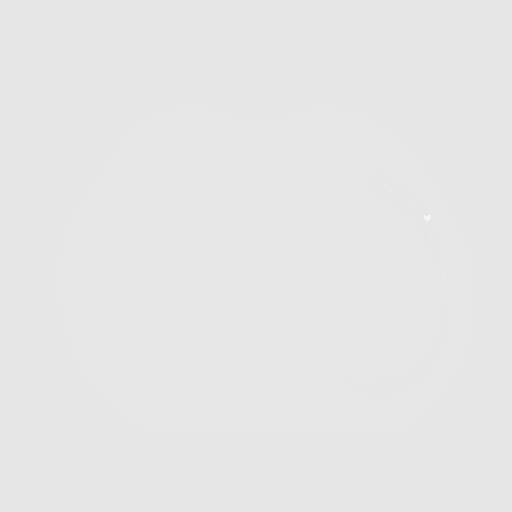
[frame 106/316  lung]
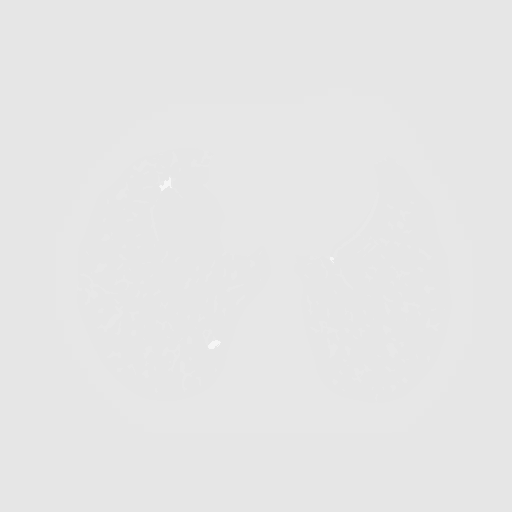
[frame 141/316  mediastinal]
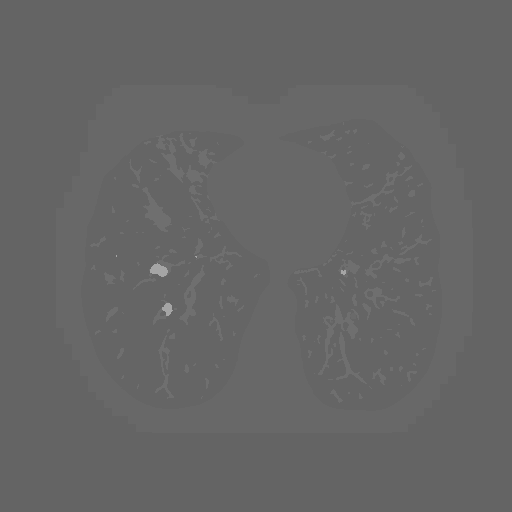
[frame 141/316  lung]
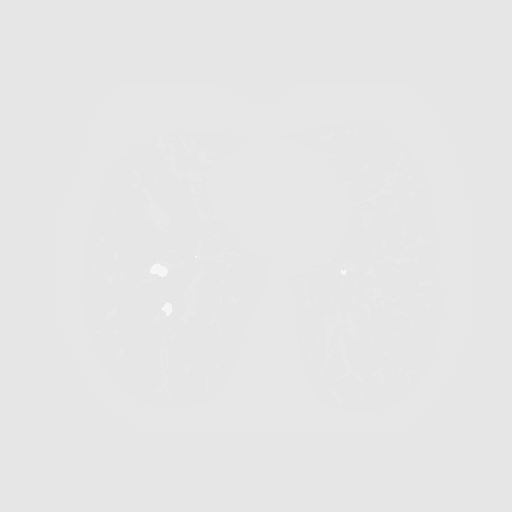
[frame 176/316  lung]
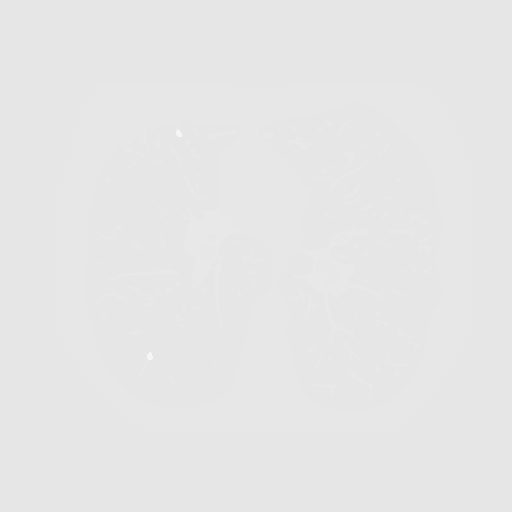
[frame 211/316  lung]
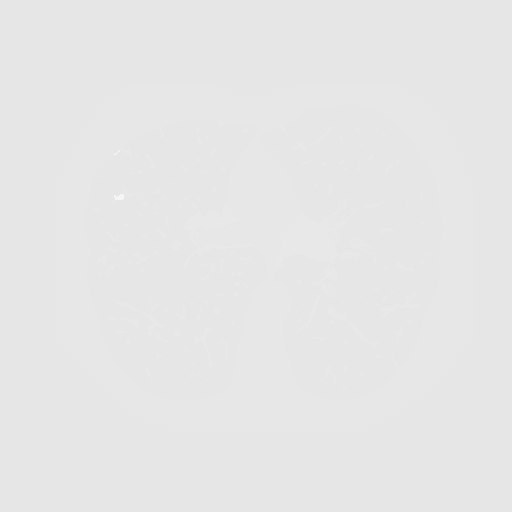
[frame 246/316  lung]
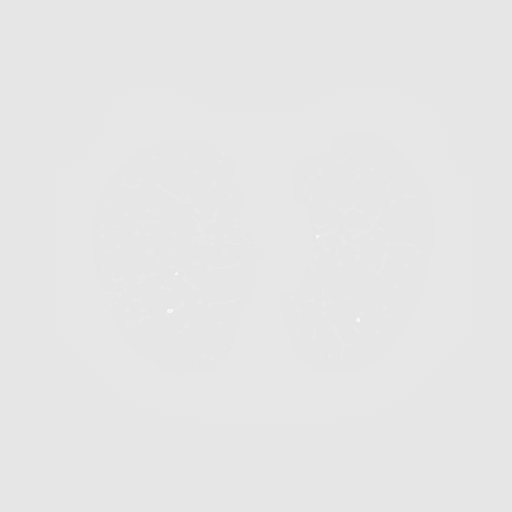
[frame 281/316  mediastinal]
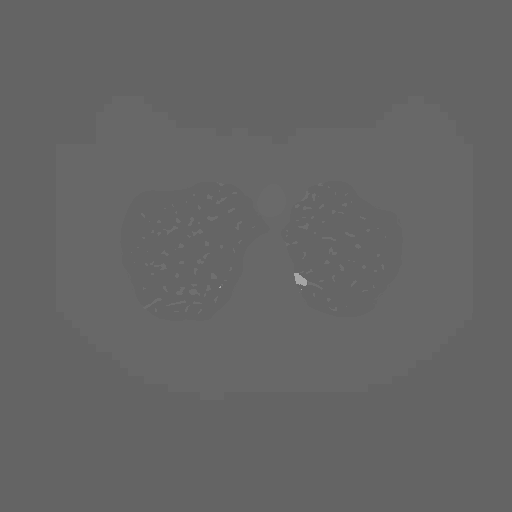
[frame 281/316  lung]
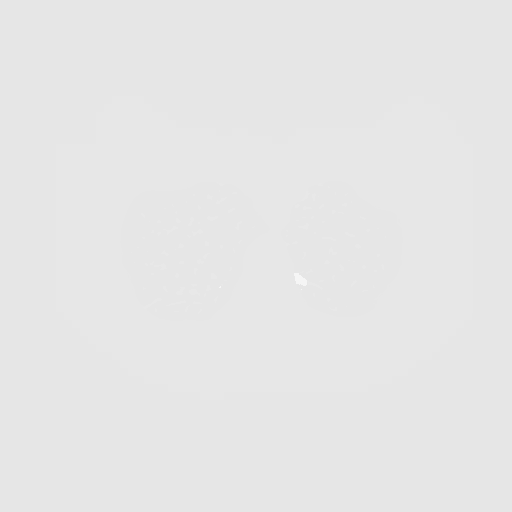
[frame 316/316  lung]
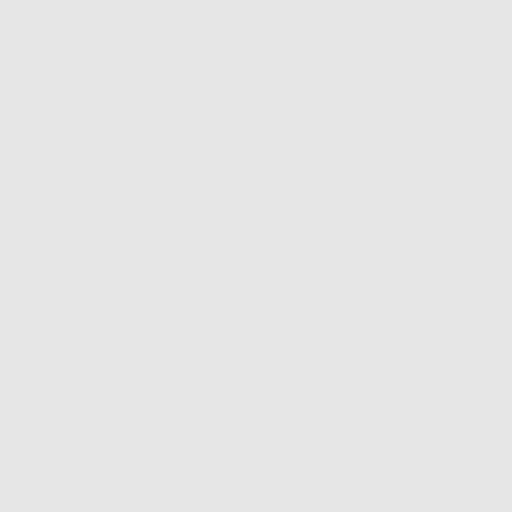

[10 of 10 positions shown; findings below may reference images not displayed]

FINDINGS: Cardiovascular: Heart size appears within normal limits. No
pericardial effusion. Aortic atherosclerosis and coronary artery
calcifications.

Mediastinum/Nodes: No enlarged mediastinal, hilar, or axillary lymph
nodes. Thyroid gland, trachea, and esophagus demonstrate no
significant findings.

Lungs/Pleura: Paraseptal and centrilobular emphysema. No pleural
effusion or airspace consolidation. Again noted is diffuse bronchial
wall thickening with extensive cylindrical bronchiectasis which has
a lower lung zone predominance. Progressive areas of bilateral lower
lung zone predominant airway impaction is identified. This is most
severe within the right middle lobe and right lower lobe. As mention
on the previous exam, assessment for endobronchial lesions is
significantly diminished due to extensive airway impaction
bilaterally. Previously identified pulmonary nodules are similar
when compared with the previous exam. There are multiple new lung
nodules identified bilaterally the largest of which is in the
posteromedial right lower lobe with a mean derived diameter a
mm.

Upper Abdomen: No acute abnormality.

Musculoskeletal: No chest wall mass or suspicious bone lesions
identified.
IMPRESSION: 1. Lung-RADS 4As, suspicious. Follow up low-dose chest CT without
contrast in 3 months (please use the following order, "CT CHEST LCS
NODULE FOLLOW-UP W/O CM") is recommended. Alternatively, PET may be
considered when there is a solid component 8mm or larger.
2. The S modifier above refers to the presence of extensive,
bilateral and lower lung zone predominant cylindrical bronchiectasis
with diffuse bronchial wall thickening and extensive airway
impaction. Imaging findings are concerning for chronic indolent
atypical infection such as PELT. The presence of extensive
postinflammatory change with multiple areas of airway impaction
significantly diminishes assessment for endobronchial lesions.
3. Aortic Atherosclerosis (JS302-4OM.M) and Emphysema (JS302-VX2.Y).

## 2022-08-13 ENCOUNTER — Telehealth: Payer: Self-pay | Admitting: *Deleted

## 2022-08-13 NOTE — Telephone Encounter (Signed)
Pt.notified

## 2022-08-13 NOTE — Telephone Encounter (Signed)
Pt called and asked if she should get the RSV and Flu vaccine at the same time. Pt said she sees cardiology and pulmonary and given her history of lung issues she wanted to see if PCP recommends that she get the RSV and if so does she think she can get both RSV and flu at the same time or should she wait.

## 2022-08-13 NOTE — Telephone Encounter (Signed)
They can be given together

## 2022-08-16 ENCOUNTER — Other Ambulatory Visit: Payer: Self-pay | Admitting: Internal Medicine

## 2022-08-16 ENCOUNTER — Other Ambulatory Visit: Payer: Self-pay | Admitting: Pulmonary Disease

## 2022-08-18 ENCOUNTER — Other Ambulatory Visit: Payer: Self-pay | Admitting: Hematology and Oncology

## 2022-08-18 DIAGNOSIS — Z9889 Other specified postprocedural states: Secondary | ICD-10-CM

## 2022-08-18 DIAGNOSIS — R928 Other abnormal and inconclusive findings on diagnostic imaging of breast: Secondary | ICD-10-CM

## 2022-08-20 ENCOUNTER — Ambulatory Visit (HOSPITAL_COMMUNITY)
Admission: RE | Admit: 2022-08-20 | Discharge: 2022-08-20 | Disposition: A | Discharge status: Home / Self Care | Payer: Medicare Other | Source: Ambulatory Visit | Attending: Cardiology | Admitting: Cardiology

## 2022-08-20 DIAGNOSIS — R0989 Other specified symptoms and signs involving the circulatory and respiratory systems: Secondary | ICD-10-CM | POA: Diagnosis not present

## 2022-08-21 ENCOUNTER — Ambulatory Visit
Admission: RE | Admit: 2022-08-21 | Discharge: 2022-08-21 | Disposition: A | Discharge status: Home / Self Care | Payer: Medicare Other | Source: Ambulatory Visit | Attending: Hematology and Oncology | Admitting: Hematology and Oncology

## 2022-08-21 DIAGNOSIS — Z9889 Other specified postprocedural states: Secondary | ICD-10-CM

## 2022-08-21 DIAGNOSIS — Z853 Personal history of malignant neoplasm of breast: Secondary | ICD-10-CM | POA: Diagnosis not present

## 2022-08-21 DIAGNOSIS — R92333 Mammographic heterogeneous density, bilateral breasts: Secondary | ICD-10-CM | POA: Diagnosis not present

## 2022-08-26 ENCOUNTER — Telehealth: Payer: Self-pay | Admitting: Family Medicine

## 2022-08-26 NOTE — Telephone Encounter (Signed)
  Encourage patient to contact the pharmacy for refills or they can request refills through [facility]  Did the patient contact the pharmacy: yes   LAST APPOINTMENT DATE:  Please schedule appointment if longer than 1 year 01/02/2022  NEXT APPOINTMENT DATE:  MEDICATION:levothyroxine (SYNTHROID) 25 MCG tablet   Is the patient out of medication? no  If not, how much is left? 2 weeks   Is this a 90 day supply: yes  PHARMACY: [facility], [place] Phone: [phone]  Fax: [phone]      Let patient know to contact pharmacy at the end of the day to make sure medication is ready.  Please notify patient to allow 48-72 hours to process

## 2022-08-28 MED ORDER — LEVOTHYROXINE SODIUM 25 MCG PO TABS: 25.0000 ug | ORAL_TABLET | Freq: Every day | ORAL | 2 refills | Status: DC

## 2022-08-28 NOTE — Telephone Encounter (Signed)
We took that over Lab Results  Component Value Date   TSH 3.09 12/29/2021    Will send to her pharmacy

## 2022-08-28 NOTE — Telephone Encounter (Signed)
Looks like endo filled med last, please advise

## 2022-09-04 ENCOUNTER — Telehealth (HOSPITAL_COMMUNITY): Payer: Self-pay | Admitting: Emergency Medicine

## 2022-09-04 NOTE — Telephone Encounter (Signed)
Phone rang for few minutes with no answer, no answering service Marchia Bond RN Navigator Cardiac Imaging [facility] [phone] Office  [phone] Cell

## 2022-09-08 ENCOUNTER — Encounter (HOSPITAL_COMMUNITY)
Admission: RE | Admit: 2022-09-08 | Discharge: 2022-09-08 | Disposition: A | Discharge status: Home / Self Care | Payer: Medicare Other | Source: Ambulatory Visit | Attending: Cardiology | Admitting: Cardiology

## 2022-09-08 DIAGNOSIS — R0602 Shortness of breath: Secondary | ICD-10-CM

## 2022-09-08 DIAGNOSIS — R931 Abnormal findings on diagnostic imaging of heart and coronary circulation: Secondary | ICD-10-CM

## 2022-09-08 MED ORDER — RUBIDIUM RB82 GENERATOR (RUBYFILL)
25.0000 | PACK | Freq: Once | INTRAVENOUS | Status: AC
  Administered 2022-09-08: 12.2 via INTRAVENOUS

## 2022-09-08 MED ORDER — REGADENOSON 0.4 MG/5ML IV SOLN: 0.4000 mg | Freq: Once | INTRAVENOUS | Status: AC

## 2022-09-08 MED ORDER — REGADENOSON 0.4 MG/5ML IV SOLN
INTRAVENOUS | Status: AC
  Administered 2022-09-08: 0.4 mg via INTRAVENOUS
  Filled 2022-09-08: qty 5

## 2022-09-09 LAB — NM PET CT CARDIAC PERFUSION MULTI W/ABSOLUTE BLOODFLOW
MBFR: 2.3
Nuc Rest EF: 70 %
Nuc Stress EF: 75 %
Rest MBF: 1.62 ml/g/min
Rest Nuclear Isotope Dose: 12.2 mCi
ST Depression (mm): 0 mm
Stress MBF: 3.73 ml/g/min
Stress Nuclear Isotope Dose: 12.2 mCi
TID: 1.13

## 2022-09-22 ENCOUNTER — Telehealth: Payer: Self-pay | Admitting: Pulmonary Disease

## 2022-09-22 NOTE — Telephone Encounter (Signed)
Pt said My Chart shows she has 2 refills for ( Nuvo---- ? 500 mg 1 tab a day for 14 days.) but [facility] (On line Pharm) says she needs to call for Dr.'s Auth. (They do not call Dr's office, she said.) 500 mg 1 tab a day for 14 days.Sports administrator) Pharm  in [place]. We have called them before.

## 2022-09-22 NOTE — Telephone Encounter (Signed)
Called patient and she states that she is wanting another refill of her Levaquin '500mg'$  sent in. She states that she took this in the past 1 tab daily for 2 weeks. She states she is needing the nurse to call [facility].   But I had to get this cleared by you first [place]  Please advise

## 2022-09-29 MED ORDER — LEVOFLOXACIN 500 MG PO TABS: 500.0000 mg | ORAL_TABLET | Freq: Every day | ORAL | 2 refills | Status: DC

## 2022-09-29 NOTE — Telephone Encounter (Signed)
Is her cough worsening again? If so that is ok to send in.

## 2022-09-29 NOTE — Telephone Encounter (Signed)
Called and left voicemail that I was sending in the ABT that she requested and that if she had any questions to call me back. Nothing further needed

## 2022-10-05 ENCOUNTER — Other Ambulatory Visit: Payer: Self-pay | Admitting: Gastroenterology

## 2022-10-07 ENCOUNTER — Ambulatory Visit
Admission: RE | Admit: 2022-10-07 | Discharge: 2022-10-07 | Disposition: A | Discharge status: Home / Self Care | Payer: Medicare Other | Source: Ambulatory Visit | Attending: Acute Care | Admitting: Acute Care

## 2022-10-07 DIAGNOSIS — J439 Emphysema, unspecified: Secondary | ICD-10-CM | POA: Diagnosis not present

## 2022-10-07 DIAGNOSIS — R911 Solitary pulmonary nodule: Secondary | ICD-10-CM | POA: Diagnosis not present

## 2022-10-07 DIAGNOSIS — Z87891 Personal history of nicotine dependence: Secondary | ICD-10-CM

## 2022-10-07 DIAGNOSIS — I700 Atherosclerosis of aorta: Secondary | ICD-10-CM | POA: Diagnosis not present

## 2022-10-13 ENCOUNTER — Telehealth: Payer: Self-pay | Admitting: *Deleted

## 2022-10-13 NOTE — Telephone Encounter (Signed)
  IMPRESSION: 1. The appearance of the lungs is most compatible with a chronic indolent atypical infectious process such as MAI (mycobacterium avium intracellulare). There is a waxing and waning pattern of nodularity which severely limits assessment for underlying lung cancer, and accordingly, today's study is classified as Lung-RADS 0S. Given this background of chronic atypical infection, this patient is not considered a suitable candidate for future lung cancer screening. Outpatient referral to Pulmonology for clinical management of chronic atypical infection is recommended. 2. The "S" modifier above refers to potentially clinically significant non lung cancer related findings. Specifically, there is aortic atherosclerosis, in addition to left main and 2 vessel coronary artery disease. Assessment for potential risk factor modification, dietary therapy or pharmacologic therapy may be warranted, if clinically indicated. 3. Mild diffuse bronchial wall thickening with very mild centrilobular and paraseptal emphysema; imaging findings suggestive of underlying COPD  Called and spoke with pt's husband (DPR). He states that pt is not home now but she did have some questions regarding CT results. He will have her return our call this afternoon.   Need to ask if pt is having any respiratory symptoms. Is she still taking Levaquin?  She also has appt with Dr Silas Flood on 10/21/22.

## 2022-10-14 NOTE — Telephone Encounter (Signed)
I spoke with pt regarding lung screening CT results. Pt continues to have issues with congestion/ mucus in sinuses and chest. She called in a week or so ago and was given Levaquin for 14 days . She is currently on day 7 of this and is scheduled to see Dr Silas Flood on 10/21/21.  Dr Silas Flood, please let us know after you see her if you feel like she should continue in lung screening program .

## 2022-10-15 NOTE — Telephone Encounter (Signed)
Sounds good thanks

## 2022-10-21 ENCOUNTER — Encounter: Payer: Self-pay | Admitting: Pulmonary Disease

## 2022-10-21 ENCOUNTER — Ambulatory Visit (INDEPENDENT_AMBULATORY_CARE_PROVIDER_SITE_OTHER): Payer: Medicare Other | Admitting: Pulmonary Disease

## 2022-10-21 VITALS — BP 110/70 | HR 80 | Temp 97.6°F | Ht 63.0 in | Wt 104.6 lb

## 2022-10-21 DIAGNOSIS — J479 Bronchiectasis, uncomplicated: Secondary | ICD-10-CM | POA: Diagnosis not present

## 2022-10-21 NOTE — Patient Instructions (Signed)
Nice to see you again  I will ask about the lung cancer screening   I will place the appropriate order for the vest  Return to clinic in 3 months or sooner as needed

## 2022-10-22 ENCOUNTER — Telehealth: Payer: Self-pay | Admitting: Acute Care

## 2022-10-22 DIAGNOSIS — Z87891 Personal history of nicotine dependence: Secondary | ICD-10-CM

## 2022-10-22 DIAGNOSIS — R911 Solitary pulmonary nodule: Secondary | ICD-10-CM

## 2022-10-22 NOTE — Telephone Encounter (Signed)
Pt. Was seen by Dr. Silas Flood. She still wants to be screened. Dr. Silas Flood has started chest therapy on the patient.We will do a 6 momnth low dose CT Chest, and if there is improverment in the condition of her lungs we will continue to screen her as long as she meets criteria. If the vest makes no difference, we will re-evaluate whether she should stay in the program.  Please order follow up LDCT end of June 2024, and fax results to PCP with plan of care. Thanks so much

## 2022-10-23 NOTE — Telephone Encounter (Signed)
CT results faxed to PCP. Order placed for 6 month nodule f/u LCS CT.

## 2022-10-25 NOTE — Progress Notes (Incomplete)
$'@Patient'b$  ID: Cheryl Aguirre, female    DOB: 07-Nov-1945, 77 y.o.   MRN: [id number]  Chief Complaint  Patient presents with  . Follow-up    Pt feeling better     Referring provider: Tower, Wynelle Fanny, MD  HPI:   77 y.o. whom we are seeing in follow up for evaluation of bronchiectasis.  Most recent endocrinology note reviewed.  Most recent cardiology  note reviewed.  Most recent PCP note reviewed.    HPI at initial visit: Patient notes a remarkably long history of recurrent upper respiratory illnesses.  Bouts of bronchitis, pneumonia etc.  Recalls a time as a child but was almost admitted to the hospital.  Again is occurred throughout her life.  Has productive cough daily.  Seems worse over the last several months.  Received Z-Pak x2 with mild improvement in symptoms on the temporary basis.  No time during day with things are better or worse.  No seasonal or environmental factors she can identify.  No other alleviating or exacerbating factors she can identify.  Reviewed extensively past CT scans most remote 2009 of the monitor patient reveals most prominently in the lingula and right middle lobe bronchiectatic changes with mild scarring likely the postinflammatory condition/pneumonia but scattered more mild bronchiectasis in the upper lobes and lower lobes bilaterally on my interpretation.  Reviewed lung cancer CT scan 11/13/2019 and 1 more recently 2022 that shows progression of diffuse bilateral bronchiectasis most prominent in the middle lobe and lingula and lower lobes but significant in the upper lobes as well with scattered mucus impaction on my interpretation.  No history of diabetes.  No history of recurrent diarrhea.  She does have sinus disease or sinus congestion.  History of seasonal allergies although not super severe.  More deep congestion, in the sinuses the posterior rhinorrhea, postnasal drip.  PMH: Hypothyroidism, GERD, breast cancer currently on tamoxifen  therapy Surgical history: Hysterectomy, breast surgery 2019 Family history: Cancer runs in family, no first-degree relatives with respiratory illnesses Social history: Current everyday smoker, 1 pack a day, 60-pack-year history, lives in [place] / Pulmonary Flowsheets:   ACT:      No data to display           MMRC:     No data to display           Epworth:      No data to display           Tests:   FENO:  No results found for: "NITRICOXIDE"  PFT:     No data to display           WALK:      No data to display           Imaging: Personally reviewed and as per EMR  Lab Results: Personally reviewed CBC    Component Value Date/Time   WBC 6.1 12/29/2021 1109   RBC 4.11 12/29/2021 1109   HGB 13.5 12/29/2021 1109   HCT 39.8 12/29/2021 1109   PLT 231.0 12/29/2021 1109   MCV 96.9 12/29/2021 1109   MCH 31.5 01/21/2018 1012   MCHC 33.8 12/29/2021 1109   RDW 13.5 12/29/2021 1109   LYMPHSABS 2.2 12/29/2021 1109   MONOABS 0.7 12/29/2021 1109   EOSABS 0.2 12/29/2021 1109   BASOSABS 0.0 12/29/2021 1109    BMET    Component Value Date/Time   NA 139 12/29/2021 1109   K 4.4 12/29/2021 1109   CL 103 12/29/2021 1109  CO2 31 12/29/2021 1109   GLUCOSE 97 12/29/2021 1109   BUN 14 12/29/2021 1109   CREATININE 0.81 12/29/2021 1109   CALCIUM 8.9 12/29/2021 1109   GFRNONAA >60 01/21/2018 1012   GFRAA >60 01/21/2018 1012    BNP No results found for: "BNP"  ProBNP No results found for: "PROBNP"  Specialty Problems       Pulmonary Problems   Allergic rhinitis    Allergies  Allergen Reactions  . Adhesive [Tape]     Tears skin  . Alendronate Sodium     REACTION: muscle, joint pain, indigestion  . Penicillins Other (See Comments)    Has patient had a PCN reaction causing immediate rash, facial/tongue/throat swelling, SOB or lightheadedness with hypotension: yes Has patient had a PCN reaction causing severe rash  involving mucus membranes or skin necrosis: no Has patient had a PCN reaction that required hospitalization: no Has patient had a PCN reaction occurring within the last 10 years: no If all of the above answers are "NO", then may proceed with Cephalosporin use. **CHILDHOOD**  . Varenicline Tartrate Nausea Only       . Doxycycline Nausea And Vomiting and Rash         Immunization History  Administered Date(s) Administered  . Influenza Split 07/27/2011, 07/12/2013  . Influenza Whole 08/08/2002, 07/20/2007  . Influenza, High Dose Seasonal PF 08/01/2015, 07/12/2017, 07/22/2018, 07/17/2019, 07/09/2020  . Influenza, Seasonal, Injecte, Preservative Fre 08/04/2016  . Influenza-Unspecified 07/13/2014, 08/14/2021  . PFIZER(Purple Top)SARS-COV-2 Vaccination 01/04/2020, 01/25/2020  . Pneumococcal Conjugate-13 11/19/2014  . Pneumococcal Polysaccharide-23 05/01/2005, 11/01/2012  . Td 03/13/1999, 07/24/2010  . Zoster, Live 08/12/2014    Past Medical History:  Diagnosis Date  . Anxiety   . Arthritis    left hip  . Breast cancer ([place])   . Cancer ([place]) 2019   breast  . Chronic headaches   . Colon polyps   . Depression   . Family history of breast cancer   . Family history of colon cancer   . Family history of pancreatic cancer   . Family history of thyroid cancer   . GERD (gastroesophageal reflux disease)   . Hypertension    elevated at times.  no meds  . IBS (irritable bowel syndrome)   . Insomnia   . Osteopenia   . Personal history of radiation therapy    2019  . Tobacco abuse     Tobacco History: Social History   Tobacco Use  Smoking Status Every Day  . Packs/day: 1.00  . Years: 56.00  . Total pack years: 56.00  . Types: Cigarettes  Smokeless Tobacco Never  Tobacco Comments   Pt states that she smokes 1 ppd. ALS 07/14/22   Ready to quit: Not Answered Counseling given: Not Answered Tobacco comments: Pt states that she smokes 1 ppd. ALS 07/14/22   Continue to not  smoke  Outpatient Encounter Medications as of 10/21/2022  Medication Sig  . acetaminophen (TYLENOL) 500 MG tablet Take 500 mg by mouth every 8 (eight) hours as needed.  . calcium carbonate (TUMS - DOSED IN MG ELEMENTAL CALCIUM) 500 MG chewable tablet Chew 1 tablet by mouth as needed for indigestion or heartburn.  . cetirizine (ZYRTEC) 10 MG tablet Take 10 mg by mouth daily.  . cyclobenzaprine (FLEXERIL) 10 MG tablet Take 1 tablet (10 mg total) by mouth at bedtime as needed (migraine).  Marland Kitchen escitalopram (LEXAPRO) 20 MG tablet Take 1 tablet (20 mg total) by mouth daily.  . famotidine (PEPCID) 20  MG tablet Take 1 tablet (20 mg total) by mouth 2 (two) times daily.  . fish oil-omega-3 fatty acids 1000 MG capsule Take 1 g by mouth daily.  . Lactobacillus (ACIDOPHILUS) 100 MG CAPS Take 1 capsule by mouth daily.  Marland Kitchen levofloxacin (LEVAQUIN) 500 MG tablet Take 1 tablet (500 mg total) by mouth daily.  Marland Kitchen levothyroxine (SYNTHROID) 25 MCG tablet Take 1 tablet (25 mcg total) by mouth daily before breakfast.  . Magnesium 250 MG TABS Take 1 tablet by mouth daily as needed (constipation).   . Melatonin 5 MG TABS Take 5 mg by mouth at bedtime. As directed at bedtime.  . montelukast (SINGULAIR) 10 MG tablet Take 1 tablet (10 mg total) by mouth at bedtime.  . Multiple Vitamin (MULTIVITAMIN) tablet Take 1 tablet by mouth daily.  . pantoprazole (PROTONIX) 40 MG tablet Take 1 tablet (40 mg total) by mouth daily.  . rosuvastatin (CRESTOR) 10 MG tablet Take 1 tablet (10 mg total) by mouth daily.  . sodium chloride HYPERTONIC 3 % nebulizer solution Take by nebulization as needed for other.  . tamoxifen (NOLVADEX) 20 MG tablet TAKE 1 TABLET DAILY  . Turmeric 500 MG CAPS Take 1 tablet by mouth daily.  . Vitamin D-Vitamin K (VITAMIN K2-VITAMIN D3 PO) Take 1 capsule by mouth daily.   Facility-Administered Encounter Medications as of 10/21/2022  Medication  . 0.9 %  sodium chloride infusion     Review of Systems  Review  of Systems  N/a  Physical Exam  BP 110/70   Pulse 80   Temp 97.6 F (36.4 C) (Oral)   Ht '5\' 3"'$  (1.6 m)   Wt 104 lb 9.6 oz (47.4 kg)   SpO2 95%   BMI 18.53 kg/m   Wt Readings from Last 5 Encounters:  10/21/22 104 lb 9.6 oz (47.4 kg)  08/11/22 104 lb 3.2 oz (47.3 kg)  07/14/22 102 lb (46.3 kg)  01/02/22 105 lb 2 oz (47.7 kg)  12/31/21 108 lb (49 kg)    BMI Readings from Last 5 Encounters:  10/21/22 18.53 kg/m  08/11/22 19.06 kg/m  07/14/22 18.07 kg/m  01/02/22 19.23 kg/m  12/31/21 19.75 kg/m     Physical Exam General: Well-appearing, in no acute distress Eyes: EOMI, no icterus Neck: Supple, no JVP appreciated Cardiovascular: Regular rate and rhythm, no murmur Pulmonary: Clear to auscultation bilaterally, normal work of breathing, diminished in the bases Abdomen: Nondistended, bowel sounds present MSK: No synovitis, no joint effusion Neuro: Normal gait, no weakness Psych: Normal mood, full affect   Assessment & Plan:   Bronchiectasis with acute exacerbation: Present since at least 2009 as seen on CT scan.  Progressively worsening over time on serial images.  Suspect multifactorial related to high suspicion of NTM disease given the symptoms as well as more prominent lingula and right middle lobe pathology even back in 2009, chronic aspiration given her description of difficulty swallowing, related to recurrent infections given her description of multiple pneumonias bronchitis etc. from childhood on.  Low suspicion for PCD given no issues with infertility.  Given recurrent infections, work-up for immunodeficiency with immunoglobulins was pursued with normal levels  Alpha-1 antitrypsin was normal. CF mutation panel normal.  Respiratory culture reveals colonization with pansensitive Klebsiella pneumonia and Pseudomonas aeruginosa.  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  Not doing airway clearance.  New order for vest for chest physiotherapy today.  Given worsening cough  over the summer, levofloxacin x14 days to treat Klebsiella and Pseudomonas on sputum culture.  Return in about 3 months (around 01/20/2023).   Lanier Clam, MD 10/25/2022   This appointment required 42 minutes of patient care (this includes precharting, chart review, review of results, face-to-face care, etc.).

## 2022-10-25 NOTE — Progress Notes (Signed)
$'@Patient'W$  ID: Cheryl Aguirre, female    DOB: Nov 24, 1945, 77 y.o.   MRN: [id number]  Chief Complaint  Patient presents with   Follow-up    Pt feeling better     Referring provider: Tower, Cheryl Fanny, MD  HPI:   77 y.o. whom we are seeing in follow up for evaluation of bronchiectasis.  Most recent endocrinology note reviewed.  Most recent cardiology  note reviewed.  Most recent PCP note reviewed.  At last visit, returned after prolonged absence from clinic.  Was placed on levofloxacin given history of Klebsiella and Pseudomonas.  Improved the interim.  Had worsening cough with before Christmas.  Additional course of levofloxacin prescribed.  This greatly helped cough.  She does not do much for airway clearance.  Discussed role and rationale for airway clearance.  Hypertonic saline did not seem to help.  CT lung cancer screening recently pursued.  This revealed scattered nodules.  In the lingula and right middle lobe.  Radiologist recommended no longer continuing lung cancer screening.  We discussed this at length.  She highly desires to stay on the program.  HPI at initial visit: Patient notes a remarkably long history of recurrent upper respiratory illnesses.  Bouts of bronchitis, pneumonia etc.  Recalls a time as a child but was almost admitted to the hospital.  Again is occurred throughout her life.  Has productive cough daily.  Seems worse over the last several months.  Received Z-Pak x2 with mild improvement in symptoms on the temporary basis.  No time during day with things are better or worse.  No seasonal or environmental factors she can identify.  No other alleviating or exacerbating factors she can identify.  Reviewed extensively past CT scans most remote 2009 of the monitor patient reveals most prominently in the lingula and right middle lobe bronchiectatic changes with mild scarring likely the postinflammatory condition/pneumonia but scattered more mild bronchiectasis in the  upper lobes and lower lobes bilaterally on my interpretation.  Reviewed lung cancer CT scan 11/13/2019 and 1 more recently 2022 that shows progression of diffuse bilateral bronchiectasis most prominent in the middle lobe and lingula and lower lobes but significant in the upper lobes as well with scattered mucus impaction on my interpretation.  No history of diabetes.  No history of recurrent diarrhea.  She does have sinus disease or sinus congestion.  History of seasonal allergies although not super severe.  More deep congestion, in the sinuses the posterior rhinorrhea, postnasal drip.  PMH: Hypothyroidism, GERD, breast cancer currently on tamoxifen therapy Surgical history: Hysterectomy, breast surgery 2019 Family history: Cancer runs in family, no first-degree relatives with respiratory illnesses Social history: Current everyday smoker, 1 pack a day, 60-pack-year history, lives in [place] / Pulmonary Flowsheets:   ACT:      No data to display           MMRC:     No data to display           Epworth:      No data to display           Tests:   FENO:  No results found for: "NITRICOXIDE"  PFT:     No data to display           WALK:      No data to display           Imaging: Personally reviewed and as per EMR  Lab Results: Personally reviewed CBC  Component Value Date/Time   WBC 6.1 12/29/2021 1109   RBC 4.11 12/29/2021 1109   HGB 13.5 12/29/2021 1109   HCT 39.8 12/29/2021 1109   PLT 231.0 12/29/2021 1109   MCV 96.9 12/29/2021 1109   MCH 31.5 01/21/2018 1012   MCHC 33.8 12/29/2021 1109   RDW 13.5 12/29/2021 1109   LYMPHSABS 2.2 12/29/2021 1109   MONOABS 0.7 12/29/2021 1109   EOSABS 0.2 12/29/2021 1109   BASOSABS 0.0 12/29/2021 1109    BMET    Component Value Date/Time   NA 139 12/29/2021 1109   K 4.4 12/29/2021 1109   CL 103 12/29/2021 1109   CO2 31 12/29/2021 1109   GLUCOSE 97 12/29/2021 1109   BUN 14  12/29/2021 1109   CREATININE 0.81 12/29/2021 1109   CALCIUM 8.9 12/29/2021 1109   GFRNONAA >60 01/21/2018 1012   GFRAA >60 01/21/2018 1012    BNP No results found for: "BNP"  ProBNP No results found for: "PROBNP"  Specialty Problems       Pulmonary Problems   Allergic rhinitis    Allergies  Allergen Reactions   Adhesive [Tape]     Tears skin   Alendronate Sodium     REACTION: muscle, joint pain, indigestion   Penicillins Other (See Comments)    Has patient had a PCN reaction causing immediate rash, facial/tongue/throat swelling, SOB or lightheadedness with hypotension: yes Has patient had a PCN reaction causing severe rash involving mucus membranes or skin necrosis: no Has patient had a PCN reaction that required hospitalization: no Has patient had a PCN reaction occurring within the last 10 years: no If all of the above answers are "NO", then may proceed with Cephalosporin use. **CHILDHOOD**   Varenicline Tartrate Nausea Only        Doxycycline Nausea And Vomiting and Rash         Immunization History  Administered Date(s) Administered   Influenza Split 07/27/2011, 07/12/2013   Influenza Whole 08/08/2002, 07/20/2007   Influenza, High Dose Seasonal PF 08/01/2015, 07/12/2017, 07/22/2018, 07/17/2019, 07/09/2020   Influenza, Seasonal, Injecte, Preservative Fre 08/04/2016   Influenza-Unspecified 07/13/2014, 08/14/2021   PFIZER(Purple Top)SARS-COV-2 Vaccination 01/04/2020, 01/25/2020   Pneumococcal Conjugate-13 11/19/2014   Pneumococcal Polysaccharide-23 05/01/2005, 11/01/2012   Td 03/13/1999, 07/24/2010   Zoster, Live 08/12/2014    Past Medical History:  Diagnosis Date   Anxiety    Arthritis    left hip   Breast cancer ([place])    Cancer ([place]) 2019   breast   Chronic headaches    Colon polyps    Depression    Family history of breast cancer    Family history of colon cancer    Family history of pancreatic cancer    Family history of thyroid cancer    GERD  (gastroesophageal reflux disease)    Hypertension    elevated at times.  no meds   IBS (irritable bowel syndrome)    Insomnia    Osteopenia    Personal history of radiation therapy    2019   Tobacco abuse     Tobacco History: Social History   Tobacco Use  Smoking Status Every Day   Packs/day: 1.00   Years: 56.00   Total pack years: 56.00   Types: Cigarettes  Smokeless Tobacco Never  Tobacco Comments   Pt states that she smokes 1 ppd. ALS 07/14/22   Ready to quit: Not Answered Counseling given: Not Answered Tobacco comments: Pt states that she smokes 1 ppd. ALS 07/14/22   Continue  to not smoke  Outpatient Encounter Medications as of 10/21/2022  Medication Sig   acetaminophen (TYLENOL) 500 MG tablet Take 500 mg by mouth every 8 (eight) hours as needed.   calcium carbonate (TUMS - DOSED IN MG ELEMENTAL CALCIUM) 500 MG chewable tablet Chew 1 tablet by mouth as needed for indigestion or heartburn.   cetirizine (ZYRTEC) 10 MG tablet Take 10 mg by mouth daily.   cyclobenzaprine (FLEXERIL) 10 MG tablet Take 1 tablet (10 mg total) by mouth at bedtime as needed (migraine).   escitalopram (LEXAPRO) 20 MG tablet Take 1 tablet (20 mg total) by mouth daily.   famotidine (PEPCID) 20 MG tablet Take 1 tablet (20 mg total) by mouth 2 (two) times daily.   fish oil-omega-3 fatty acids 1000 MG capsule Take 1 g by mouth daily.   Lactobacillus (ACIDOPHILUS) 100 MG CAPS Take 1 capsule by mouth daily.   levofloxacin (LEVAQUIN) 500 MG tablet Take 1 tablet (500 mg total) by mouth daily.   levothyroxine (SYNTHROID) 25 MCG tablet Take 1 tablet (25 mcg total) by mouth daily before breakfast.   Magnesium 250 MG TABS Take 1 tablet by mouth daily as needed (constipation).    Melatonin 5 MG TABS Take 5 mg by mouth at bedtime. As directed at bedtime.   montelukast (SINGULAIR) 10 MG tablet Take 1 tablet (10 mg total) by mouth at bedtime.   Multiple Vitamin (MULTIVITAMIN) tablet Take 1 tablet by mouth daily.    pantoprazole (PROTONIX) 40 MG tablet Take 1 tablet (40 mg total) by mouth daily.   rosuvastatin (CRESTOR) 10 MG tablet Take 1 tablet (10 mg total) by mouth daily.   sodium chloride HYPERTONIC 3 % nebulizer solution Take by nebulization as needed for other.   tamoxifen (NOLVADEX) 20 MG tablet TAKE 1 TABLET DAILY   Turmeric 500 MG CAPS Take 1 tablet by mouth daily.   Vitamin D-Vitamin K (VITAMIN K2-VITAMIN D3 PO) Take 1 capsule by mouth daily.   Facility-Administered Encounter Medications as of 10/21/2022  Medication   0.9 %  sodium chloride infusion     Review of Systems  Review of Systems  N/a  Physical Exam  BP 110/70   Pulse 80   Temp 97.6 F (36.4 C) (Oral)   Ht '5\' 3"'$  (1.6 m)   Wt 104 lb 9.6 oz (47.4 kg)   SpO2 95%   BMI 18.53 kg/m   Wt Readings from Last 5 Encounters:  10/21/22 104 lb 9.6 oz (47.4 kg)  08/11/22 104 lb 3.2 oz (47.3 kg)  07/14/22 102 lb (46.3 kg)  01/02/22 105 lb 2 oz (47.7 kg)  12/31/21 108 lb (49 kg)    BMI Readings from Last 5 Encounters:  10/21/22 18.53 kg/m  08/11/22 19.06 kg/m  07/14/22 18.07 kg/m  01/02/22 19.23 kg/m  12/31/21 19.75 kg/m     Physical Exam General: Well-appearing, in no acute distress Eyes: EOMI, no icterus Neck: Supple, no JVP appreciated Cardiovascular: Regular rate and rhythm, no murmur Pulmonary: Clear to auscultation bilaterally, normal work of breathing, diminished in the bases Abdomen: Nondistended, bowel sounds present MSK: No synovitis, no joint effusion Neuro: Normal gait, no weakness Psych: Normal mood, full affect   Assessment & Plan:   Bronchiectasis with acute exacerbation: Present since at least 2009 as seen on CT scan.  Progressively worsening over time on serial images.  Suspect multifactorial related to high suspicion of NTM disease given the symptoms as well as more prominent lingula and right middle lobe pathology even back  in 2009, chronic aspiration given her description of difficulty  swallowing, related to recurrent infections given her description of multiple pneumonias bronchitis etc. from childhood on.  Low suspicion for PCD given no issues with infertility.  Given recurrent infections, work-up for immunodeficiency with immunoglobulins was pursued with normal levels.  Alpha-1 antitrypsin was normal. CF mutation panel normal.  Respiratory culture reveals colonization with pansensitive Klebsiella pneumonia and Pseudomonas aeruginosa.  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  Did not receive chest vest.  New order today.  Lung nodules: Likely NTM disease.  Undergoing CT lung cancer screening given her smoking history.  She desires to continue with low-dose CT scan.  Will message coordinator to continue enrollment.  If for some reason she can only continue enrollment as suggested by recent CT scan, we can pursue yearly CT scans to evaluate for worsening presumed NTM disease versus cavitating disease.    Return in about 3 months (around 01/20/2023).   Lanier Clam, MD 10/25/2022   This appointment required 40 minutes of patient care (this includes precharting, chart review, review of results, face-to-face care, etc.).

## 2022-12-07 ENCOUNTER — Other Ambulatory Visit: Payer: Self-pay | Admitting: Family Medicine

## 2022-12-10 ENCOUNTER — Other Ambulatory Visit: Payer: Self-pay | Admitting: Family Medicine

## 2022-12-17 ENCOUNTER — Other Ambulatory Visit: Payer: Self-pay | Admitting: Hematology and Oncology

## 2022-12-19 ENCOUNTER — Other Ambulatory Visit: Payer: Self-pay | Admitting: Pulmonary Disease

## 2022-12-21 ENCOUNTER — Ambulatory Visit
Admission: RE | Admit: 2022-12-21 | Discharge: 2022-12-21 | Disposition: A | Discharge status: Home / Self Care | Payer: TRICARE For Life (TFL) | Source: Ambulatory Visit | Attending: Family Medicine | Admitting: Family Medicine

## 2022-12-21 DIAGNOSIS — M8589 Other specified disorders of bone density and structure, multiple sites: Secondary | ICD-10-CM | POA: Diagnosis not present

## 2022-12-21 DIAGNOSIS — Z780 Asymptomatic menopausal state: Secondary | ICD-10-CM | POA: Diagnosis not present

## 2022-12-21 DIAGNOSIS — E2839 Other primary ovarian failure: Secondary | ICD-10-CM

## 2022-12-31 ENCOUNTER — Other Ambulatory Visit: Payer: Self-pay

## 2022-12-31 ENCOUNTER — Inpatient Hospital Stay: Payer: Medicare Other | Attending: Hematology and Oncology | Admitting: Hematology and Oncology

## 2022-12-31 VITALS — BP 132/61 | HR 87 | Temp 97.9°F | Resp 18 | Ht 63.0 in | Wt 107.2 lb

## 2022-12-31 DIAGNOSIS — C50311 Malignant neoplasm of lower-inner quadrant of right female breast: Secondary | ICD-10-CM | POA: Insufficient documentation

## 2022-12-31 DIAGNOSIS — Z170 Estrogen receptor positive status [ER+]: Secondary | ICD-10-CM | POA: Diagnosis not present

## 2022-12-31 DIAGNOSIS — Z79810 Long term (current) use of selective estrogen receptor modulators (SERMs): Secondary | ICD-10-CM | POA: Diagnosis not present

## 2022-12-31 DIAGNOSIS — M810 Age-related osteoporosis without current pathological fracture: Secondary | ICD-10-CM | POA: Diagnosis not present

## 2022-12-31 NOTE — Progress Notes (Signed)
Patient Care Team: Tower, Wynelle Fanny, MD as PCP - General Minus Breeding, MD as PCP - Cardiology (Cardiology) Leandrew Koyanagi, MD as Referring Physician (Ophthalmology) Mauri Pole, MD as Consulting Physician (Gastroenterology) Nicholas Lose, MD as Consulting Physician (Hematology and Oncology) Jovita Kussmaul, MD as Consulting Physician (General Surgery)  DIAGNOSIS:  Encounter Diagnosis  Name Primary?   Malignant neoplasm of lower-inner quadrant of right breast of female, estrogen receptor positive ([place]) Yes    SUMMARY OF ONCOLOGIC HISTORY: Oncology History  Malignant neoplasm of lower-inner quadrant of right breast of female, estrogen receptor positive ([place])  01/31/2018 Initial Diagnosis   Right lumpectomy: IDC with DCIS 0.4 cm, margins negative, 0/2 lymph nodes negative, grade 2, ER 100%, PR 100%, HER-2 negative ratio 1.19, T1 a N0 stage I a   02/16/2018 Cancer Staging   Staging form: Breast, AJCC 8th Edition - Pathologic: Stage IA (pT1a, pN0, cM0, G2, ER+, PR+, HER2-) - Signed by Gardenia Phlegm, NP on 02/16/2018   03/03/2018 - 03/29/2018 Radiation Therapy   Adjuvant radiation therapy   03/29/2018 Genetic Testing   Negative genetic testing on the multi-cancer panel.  The Multi-Gene Panel offered by [facility] includes sequencing and/or deletion duplication testing of the following 83 genes: ALK, APC, ATM, AXIN2,BAP1,  BARD1, BLM, BMPR1A, BRCA1, BRCA2, BRIP1, CASR, CDC73, CDH1, CDK4, CDKN1B, CDKN1C, CDKN2A (p14ARF), CDKN2A (p16INK4a), CEBPA, CHEK2, CTNNA1, DICER1, DIS3L2, EGFR (c.2369C>T, p.Thr790Met variant only), EPCAM (Deletion/duplication testing only), FH, FLCN, GATA2, GPC3, GREM1 (Promoter region deletion/duplication testing only), HOXB13 (c.251G>A, p.Gly84Glu), HRAS, KIT, MAX, MEN1, MET, MITF (c.952G>A, p.Glu318Lys variant only), MLH1, MSH2, MSH3, MSH6, MUTYH, NBN, NF1, NF2, NTHL1, PALB2, PDGFRA, PHOX2B, PMS2, POLD1, POLE, POT1, PRKAR1A, PTCH1, PTEN, RAD50, RAD51C,  RAD51D, RB1, RECQL4, RET, RUNX1, SDHAF2, SDHA (sequence changes only), SDHB, SDHC, SDHD, SMAD4, SMARCA4, SMARCB1, SMARCE1, STK11, SUFU, TERT, TERT, TMEM127, TP53, TSC1, TSC2, VHL, WRN and WT1.  The report date is March 29, 2018.   03/2018 -  Anti-estrogen oral therapy   Tamoxifen daily     CHIEF COMPLIANT:  Follow-up of right breast cancer on tamoxifen therapy    INTERVAL HISTORY: Cheryl Aguirre is a 77 y.o. with above-mentioned history of right breast cancer treated with lumpectomy, radiation, and who is currently on antiestrogen therapy with tamoxifen. She presents to the clinic for a follow-up. She reports that she is having some muscle tensity. Overall she is doing great. She has no concern or any other symptoms to report to the clinic. She states the Pulmonology is monitoring her lungs.    ALLERGIES:  is allergic to adhesive [tape], alendronate sodium, penicillins, varenicline tartrate, and doxycycline.  MEDICATIONS:  Current Outpatient Medications  Medication Sig Dispense Refill   acetaminophen (TYLENOL) 500 MG tablet Take 500 mg by mouth every 8 (eight) hours as needed.     calcium carbonate (TUMS - DOSED IN MG ELEMENTAL CALCIUM) 500 MG chewable tablet Chew 1 tablet by mouth as needed for indigestion or heartburn.     cetirizine (ZYRTEC) 10 MG tablet Take 10 mg by mouth daily.     cyclobenzaprine (FLEXERIL) 10 MG tablet Take 1 tablet (10 mg total) by mouth at bedtime as needed (migraine). 90 tablet 1   escitalopram (LEXAPRO) 20 MG tablet TAKE 1 TABLET DAILY 90 tablet 0   famotidine (PEPCID) 20 MG tablet Take 1 tablet (20 mg total) by mouth 2 (two) times daily. 180 tablet 3   fish oil-omega-3 fatty acids 1000 MG capsule Take 1 g by mouth daily.  Lactobacillus (ACIDOPHILUS) 100 MG CAPS Take 1 capsule by mouth daily.     levofloxacin (LEVAQUIN) 500 MG tablet Take 1 tablet (500 mg total) by mouth daily. 14 tablet 2   levothyroxine (SYNTHROID) 25 MCG tablet Take 1 tablet (25 mcg  total) by mouth daily before breakfast. 90 tablet 2   Magnesium 250 MG TABS Take 1 tablet by mouth daily as needed (constipation).      Melatonin 5 MG TABS Take 5 mg by mouth at bedtime. As directed at bedtime.     montelukast (SINGULAIR) 10 MG tablet TAKE 1 TABLET AT BEDTIME 90 tablet 0   Multiple Vitamin (MULTIVITAMIN) tablet Take 1 tablet by mouth daily.     pantoprazole (PROTONIX) 40 MG tablet Take 1 tablet (40 mg total) by mouth daily. 90 tablet 3   rosuvastatin (CRESTOR) 10 MG tablet Take 1 tablet (10 mg total) by mouth daily. 90 tablet 3   sodium chloride HYPERTONIC 3 % nebulizer solution Take by nebulization as needed for other. 360 mL 12   Turmeric 500 MG CAPS Take 1 tablet by mouth daily.     Vitamin D-Vitamin K (VITAMIN K2-VITAMIN D3 PO) Take 1 capsule by mouth daily.     Current Facility-Administered Medications  Medication Dose Route Frequency Provider Last Rate Last Admin   0.9 %  sodium chloride infusion  500 mL Intravenous Once Nandigam, Venia Minks, MD        PHYSICAL EXAMINATION: ECOG PERFORMANCE STATUS: 1 - Symptomatic but completely ambulatory  Vitals:   12/31/22 1512  BP: 132/61  Pulse: 87  Resp: 18  Temp: 97.9 F (36.6 C)  SpO2: 95%   Filed Weights   12/31/22 1512  Weight: 107 lb 3.2 oz (48.6 kg)     LABORATORY DATA:  I have reviewed the data as listed    Latest Ref Rng & Units 12/29/2021   11:09 AM 01/06/2021   11:00 AM 12/13/2019   12:04 PM  CMP  Glucose 70 - 99 mg/dL 97  103  98   BUN 6 - 23 mg/dL 14  13  17    Creatinine 0.40 - 1.20 mg/dL 0.81  0.79  0.86   Sodium 135 - 145 mEq/L 139  139  139   Potassium 3.5 - 5.1 mEq/L 4.4  4.2  4.5   Chloride 96 - 112 mEq/L 103  103  103   CO2 19 - 32 mEq/L 31  29  29    Calcium 8.4 - 10.5 mg/dL 8.9  9.2  9.4   Total Protein 6.0 - 8.3 g/dL 6.4  6.7  7.3   Total Bilirubin 0.2 - 1.2 mg/dL 0.4  0.3  0.5   Alkaline Phos 39 - 117 U/L 65  68  73   AST 0 - 37 U/L 21  22  25    ALT 0 - 35 U/L 11  11  13      Lab  Results  Component Value Date   WBC 6.1 12/29/2021   HGB 13.5 12/29/2021   HCT 39.8 12/29/2021   MCV 96.9 12/29/2021   PLT 231.0 12/29/2021   NEUTROABS 3.0 12/29/2021    ASSESSMENT & PLAN:  Malignant neoplasm of lower-inner quadrant of right breast of female, estrogen receptor positive ([place]) 01/31/2018: Right lumpectomy: IDC with DCIS 0.4 cm, margins negative, 0/2 lymph nodes negative, grade 2, ER 100%, PR 100%, HER-2 negative ratio 1.19, T1 a N0 stage I a   Adjuvant radiation therapy 03/03/2018-03/29/2018 Treatment plan: Tamoxifen 20 mg daily 04/11/2018 (  because the patient has severe osteoporosis we are not using aromatase inhibitor therapy) completed 12/31/2022     Breast Cancer Surveillance: 1. Breast Exam: 12/31/2022: Benign 2. Mammogram: 08/21/2022: Benign breast density category C 3.  Bone density 12/21/2022: T score -2.3 (to be -2.1): Osteopenia  4.  CT chest 10/11/2022: Resembles chronic indolent infectious process such as MAI waxing and waning nodularity, emphysema   RTC on an as-needed basis.    No orders of the defined types were placed in this encounter.  The patient has a good understanding of the overall plan. she agrees with it. she will call with any problems that may develop before the next visit here. Total time spent: 30 mins including face to face time and time spent for planning, charting and co-ordination of care   Harriette Ohara, MD 12/31/22    I Gardiner Coins am acting as a Education administrator for [employer]  I have reviewed the above documentation for accuracy and completeness, and I agree with the above.

## 2022-12-31 NOTE — Assessment & Plan Note (Addendum)
01/31/2018: Right lumpectomy: IDC with DCIS 0.4 cm, margins negative, 0/2 lymph nodes negative, grade 2, ER 100%, PR 100%, HER-2 negative ratio 1.19, T1 a N0 stage I a   Adjuvant radiation therapy 03/03/2018-03/29/2018 Treatment plan: Tamoxifen 20 mg daily 04/11/2018 (because the patient has severe osteoporosis we are not using aromatase inhibitor therapy) completed 12/31/2022     Breast Cancer Surveillance: 1. Breast Exam: 12/31/2022: Benign 2. Mammogram: 08/21/2022: Benign breast density category C 3.  Bone density 12/21/2022: T score -2.3 (to be -2.1): Osteopenia  4.  CT chest 10/11/2022: Resembles chronic indolent infectious process such as MAI waxing and waning nodularity, emphysema   RTC on an as-needed basis.

## 2023-01-04 ENCOUNTER — Ambulatory Visit (INDEPENDENT_AMBULATORY_CARE_PROVIDER_SITE_OTHER): Payer: Medicare Other

## 2023-01-04 VITALS — Ht 62.0 in | Wt 105.0 lb

## 2023-01-04 DIAGNOSIS — Z0000 Encounter for general adult medical examination without abnormal findings: Secondary | ICD-10-CM

## 2023-01-04 NOTE — Patient Instructions (Signed)
Ms. Lebreton , Thank you for taking time to come for your Medicare Wellness Visit. I appreciate your ongoing commitment to your health goals. Please review the following plan we discussed and let me know if I can assist you in the future.   These are the goals we discussed:  Goals      Increase physical activity     When weather permits, I will begin walking 60 minutes 3 days per week.      Patient Stated     12/31/21, I will maintain and continue medications as prescribed.      Patient Stated     Exercise more.        This is a list of the screening recommended for you and due dates:  Health Maintenance  Topic Date Due   Zoster (Shingles) Vaccine (1 of 2) Never done   COVID-19 Vaccine (3 - Pfizer risk series) 02/22/2020   DTaP/Tdap/Td vaccine (3 - Tdap) 07/24/2020   Flu Shot  05/12/2022   Mammogram  08/22/2023   Screening for Lung Cancer  10/08/2023   Medicare Annual Wellness Visit  01/04/2024   Colon Cancer Screening  05/15/2024   Pneumonia Vaccine  Completed   DEXA scan (bone density measurement)  Completed   Hepatitis C Screening: USPSTF Recommendation to screen - Ages 81-79 yo.  Completed   HPV Vaccine  Aged Out    Advanced directives: Please bring a copy of your health care power of attorney and living will to the office to be added to your chart at your convenience.   Conditions/risks identified: Aim for 30 minutes of exercise or brisk walking, 6-8 glasses of water, and 5 servings of fruits and vegetables each day.   If you wish to quit smoking, help is available. For free tobacco cessation program offerings call the [facility] at 571 063 0222 or Live Well Line at 506 037 8812. You may also visit [email or web address] or email [email or web address] for more information on other programs.   You may also call 1-800-QUIT-NOW (430) 840-4581) or visit www.[facility]is.dk or www.[facility]Ex.org for additional resources on smoking cessation.    Next  appointment: Follow up in one year for your annual wellness visit 01/05/24 @ 10:00 via telephone   Preventive Care 65 Years and Older, Female Preventive care refers to lifestyle choices and visits with your health care provider that can promote health and wellness. What does preventive care include? A yearly physical exam. This is also called an annual well check. Dental exams once or twice a year. Routine eye exams. Ask your health care provider how often you should have your eyes checked. Personal lifestyle choices, including: Daily care of your teeth and gums. Regular physical activity. Eating a healthy diet. Avoiding tobacco and drug use. Limiting alcohol use. Practicing safe sex. Taking low-dose aspirin every day. Taking vitamin and mineral supplements as recommended by your health care provider. What happens during an annual well check? The services and screenings done by your health care provider during your annual well check will depend on your age, overall health, lifestyle risk factors, and family history of disease. Counseling  Your health care provider may ask you questions about your: Alcohol use. Tobacco use. Drug use. Emotional well-being. Home and relationship well-being. Sexual activity. Eating habits. History of falls. Memory and ability to understand (cognition). Work and work Statistician. Reproductive health. Screening  You may have the following tests or measurements: Height, weight, and BMI. Blood pressure. Lipid and cholesterol levels. These may be checked every  5 years, or more frequently if you are over 36 years old. Skin check. Lung cancer screening. You may have this screening every year starting at age 62 if you have a 30-pack-year history of smoking and currently smoke or have quit within the past 15 years. Fecal occult blood test (FOBT) of the stool. You may have this test every year starting at age 71. Flexible sigmoidoscopy or colonoscopy. You  may have a sigmoidoscopy every 5 years or a colonoscopy every 10 years starting at age 76. Hepatitis C blood test. Hepatitis B blood test. Sexually transmitted disease (STD) testing. Diabetes screening. This is done by checking your blood sugar (glucose) after you have not eaten for a while (fasting). You may have this done every 1-3 years. Bone density scan. This is done to screen for osteoporosis. You may have this done starting at age 46. Mammogram. This may be done every 1-2 years. Talk to your health care provider about how often you should have regular mammograms. Talk with your health care provider about your test results, treatment options, and if necessary, the need for more tests. Vaccines  Your health care provider may recommend certain vaccines, such as: Influenza vaccine. This is recommended every year. Tetanus, diphtheria, and acellular pertussis (Tdap, Td) vaccine. You may need a Td booster every 10 years. Zoster vaccine. You may need this after age 52. Pneumococcal 13-valent conjugate (PCV13) vaccine. One dose is recommended after age 35. Pneumococcal polysaccharide (PPSV23) vaccine. One dose is recommended after age 32. Talk to your health care provider about which screenings and vaccines you need and how often you need them. This information is not intended to replace advice given to you by your health care provider. Make sure you discuss any questions you have with your health care provider. Document Released: 10/25/2015 Document Revised: 06/17/2016 Document Reviewed: 07/30/2015 Elsevier Interactive Patient Education  2017 [place] Prevention in the Home Falls can cause injuries. They can happen to people of all ages. There are many things you can do to make your home safe and to help prevent falls. What can I do on the outside of my home? Regularly fix the edges of walkways and driveways and fix any cracks. Remove anything that might make you trip as you walk  through a door, such as a raised step or threshold. Trim any bushes or trees on the path to your home. Use bright outdoor lighting. Clear any walking paths of anything that might make someone trip, such as rocks or tools. Regularly check to see if handrails are loose or broken. Make sure that both sides of any steps have handrails. Any raised decks and porches should have guardrails on the edges. Have any leaves, snow, or ice cleared regularly. Use sand or salt on walking paths during winter. Clean up any spills in your garage right away. This includes oil or grease spills. What can I do in the bathroom? Use night lights. Install grab bars by the toilet and in the tub and shower. Do not use towel bars as grab bars. Use non-skid mats or decals in the tub or shower. If you need to sit down in the shower, use a plastic, non-slip stool. Keep the floor dry. Clean up any water that spills on the floor as soon as it happens. Remove soap buildup in the tub or shower regularly. Attach bath mats securely with double-sided non-slip rug tape. Do not have throw rugs and other things on the floor that can make  you trip. What can I do in the bedroom? Use night lights. Make sure that you have a light by your bed that is easy to reach. Do not use any sheets or blankets that are too big for your bed. They should not hang down onto the floor. Have a firm chair that has side arms. You can use this for support while you get dressed. Do not have throw rugs and other things on the floor that can make you trip. What can I do in the kitchen? Clean up any spills right away. Avoid walking on wet floors. Keep items that you use a lot in easy-to-reach places. If you need to reach something above you, use a strong step stool that has a grab bar. Keep electrical cords out of the way. Do not use floor polish or wax that makes floors slippery. If you must use wax, use non-skid floor wax. Do not have throw rugs and  other things on the floor that can make you trip. What can I do with my stairs? Do not leave any items on the stairs. Make sure that there are handrails on both sides of the stairs and use them. Fix handrails that are broken or loose. Make sure that handrails are as long as the stairways. Check any carpeting to make sure that it is firmly attached to the stairs. Fix any carpet that is loose or worn. Avoid having throw rugs at the top or bottom of the stairs. If you do have throw rugs, attach them to the floor with carpet tape. Make sure that you have a light switch at the top of the stairs and the bottom of the stairs. If you do not have them, ask someone to add them for you. What else can I do to help prevent falls? Wear shoes that: Do not have high heels. Have rubber bottoms. Are comfortable and fit you well. Are closed at the toe. Do not wear sandals. If you use a stepladder: Make sure that it is fully opened. Do not climb a closed stepladder. Make sure that both sides of the stepladder are locked into place. Ask someone to hold it for you, if possible. Clearly mark and make sure that you can see: Any grab bars or handrails. First and last steps. Where the edge of each step is. Use tools that help you move around (mobility aids) if they are needed. These include: Canes. Walkers. Scooters. Crutches. Turn on the lights when you go into a dark area. Replace any light bulbs as soon as they burn out. Set up your furniture so you have a clear path. Avoid moving your furniture around. If any of your floors are uneven, fix them. If there are any pets around you, be aware of where they are. Review your medicines with your doctor. Some medicines can make you feel dizzy. This can increase your chance of falling. Ask your doctor what other things that you can do to help prevent falls. This information is not intended to replace advice given to you by your health care provider. Make sure you  discuss any questions you have with your health care provider. Document Released: 07/25/2009 Document Revised: 03/05/2016 Document Reviewed: 11/02/2014 Elsevier Interactive Patient Education  2017 [employer].

## 2023-01-04 NOTE — Progress Notes (Signed)
I connected with  Cheryl Aguirre on 01/04/23 by a audio enabled telemedicine application and verified that I am speaking with the correct person using two identifiers.  Patient Location: Home  Provider Location: Office/Clinic  I discussed the limitations of evaluation and management by telemedicine. The patient expressed understanding and agreed to proceed.  Subjective:   Cheryl Aguirre is a 77 y.o. female who presents for Medicare Annual (Subsequent) preventive examination.  Review of Systems      Cardiac Risk Factors include: sedentary lifestyle;advanced age (>50men, >37 women);smoking/ tobacco exposure     Objective:    Today's Vitals   01/04/23 0817  Weight: 105 lb (47.6 kg)  Height: 5\' 2"  (1.575 m)   Body mass index is 19.2 kg/m.     01/04/2023    8:26 AM 12/31/2021   11:37 AM 12/11/2019    2:47 PM 11/30/2018   11:07 AM 05/02/2018    2:30 PM 02/24/2018    2:49 PM 02/15/2018   10:39 AM  Advanced Directives  Does Patient Have a Medical Advance Directive? Yes Yes Yes Yes Yes Yes Yes  Type of Paramedic of [place];Living will [place];Living will [place];Living will [place];Living will [place];Living will  [place];Living will  Does patient want to make changes to medical advance directive?       No - Patient declined  Copy of [place] in Chart? No - copy requested Yes - validated most recent copy scanned in chart (See row information) No - copy requested Yes - validated most recent copy scanned in chart (See row information)   Yes    Current Medications (verified) Outpatient Encounter Medications as of 01/04/2023  Medication Sig   acetaminophen (TYLENOL) 500 MG tablet Take 500 mg by mouth every 8 (eight) hours as needed.   calcium carbonate (TUMS - DOSED IN MG ELEMENTAL CALCIUM) 500 MG chewable tablet Chew 1 tablet by  mouth as needed for indigestion or heartburn.   cetirizine (ZYRTEC) 10 MG tablet Take 10 mg by mouth daily.   cyclobenzaprine (FLEXERIL) 10 MG tablet Take 1 tablet (10 mg total) by mouth at bedtime as needed (migraine).   escitalopram (LEXAPRO) 20 MG tablet TAKE 1 TABLET DAILY   famotidine (PEPCID) 20 MG tablet Take 1 tablet (20 mg total) by mouth 2 (two) times daily.   fish oil-omega-3 fatty acids 1000 MG capsule Take 1 g by mouth daily.   Lactobacillus (ACIDOPHILUS) 100 MG CAPS Take 1 capsule by mouth daily.   levothyroxine (SYNTHROID) 25 MCG tablet Take 1 tablet (25 mcg total) by mouth daily before breakfast.   Magnesium 250 MG TABS Take 1 tablet by mouth daily as needed (constipation).    Melatonin 5 MG TABS Take 5 mg by mouth at bedtime. As directed at bedtime.   montelukast (SINGULAIR) 10 MG tablet TAKE 1 TABLET AT BEDTIME   Multiple Vitamin (MULTIVITAMIN) tablet Take 1 tablet by mouth daily.   Multiple Vitamins-Minerals (PRESERVISION AREDS PO) Take by mouth.   rosuvastatin (CRESTOR) 10 MG tablet Take 1 tablet (10 mg total) by mouth daily.   Turmeric 500 MG CAPS Take 1 tablet by mouth daily.   Vitamin D-Vitamin K (VITAMIN K2-VITAMIN D3 PO) Take 1 capsule by mouth daily.   levofloxacin (LEVAQUIN) 500 MG tablet Take 1 tablet (500 mg total) by mouth daily. (Patient not taking: Reported on 01/04/2023)   pantoprazole (PROTONIX) 40 MG tablet  Take 1 tablet (40 mg total) by mouth daily. (Patient not taking: Reported on 01/04/2023)   sodium chloride HYPERTONIC 3 % nebulizer solution Take by nebulization as needed for other. (Patient not taking: Reported on 01/04/2023)   Facility-Administered Encounter Medications as of 01/04/2023  Medication   0.9 %  sodium chloride infusion    Allergies (verified) Adhesive [tape], Alendronate sodium, Penicillins, Varenicline tartrate, and Doxycycline   History: Past Medical History:  Diagnosis Date   Anxiety    Arthritis    left hip   Breast cancer ([place])     Cancer ([place]) 2019   breast   Chronic headaches    Colon polyps    Depression    Family history of breast cancer    Family history of colon cancer    Family history of pancreatic cancer    Family history of thyroid cancer    GERD (gastroesophageal reflux disease)    Hypertension    elevated at times.  no meds   IBS (irritable bowel syndrome)    Insomnia    Osteopenia    Personal history of radiation therapy    2019   Tobacco abuse    Past Surgical History:  Procedure Laterality Date   ABDOMINAL HYSTERECTOMY     bleeding, fibroid, endometriosis (total)   BASAL CELL CARCINOMA EXCISION  11/22/2018   BASAL CELL CARCINOMA EXCISION  07/12/2018   BREAST LUMPECTOMY Right 01/2018   BREAST LUMPECTOMY WITH RADIOACTIVE SEED AND SENTINEL LYMPH NODE BIOPSY Right 01/31/2018   Procedure: BREAST LUMPECTOMY WITH RADIOACTIVE SEED AND SENTINEL LYMPH NODE BIOPSY;  Surgeon: Jovita Kussmaul, MD;  Location: [facility];  Service: General;  Laterality: Right;   BREAST SURGERY  2019   CATARACT EXTRACTION W/PHACO Left 02/11/2017   Procedure: CATARACT EXTRACTION PHACO AND INTRAOCULAR LENS PLACEMENT ([place]);  Surgeon: Leandrew Koyanagi, MD;  Location: [facility];  Service: Ophthalmology;  Laterality: Left;  [place] 1:02.8 AP% 13.7 CDE 8.58 FLUID PACK LOT # [id number] H   CATARACT EXTRACTION W/PHACO Right 03/10/2017   Procedure: CATARACT EXTRACTION PHACO AND INTRAOCULAR LENS PLACEMENT ([facility]) Right symfony toric lens;  Surgeon: Leandrew Koyanagi, MD;  Location: [facility];  Service: Ophthalmology;  Laterality: Right;  symfony toric lens prefers early   CT of chest  10/09   bronchiectasis   ENDOMETRIAL BIOPSY     LAPAROSCOPY  1984   Family History  Problem Relation Age of Onset   Cancer Mother        pancreatic, liver   Cancer Father        colon   Colon cancer Father 99   Thyroid disease Sister    Colon polyps Sister    Irritable bowel syndrome Sister    Breast cancer Sister 62   Melanoma Sister 26    Thyroid disease Sister    Thyroid disease Sister    Skin cancer Brother    Breast cancer Maternal Aunt        dx under 58   Glaucoma Maternal Aunt    Colon cancer Paternal Grandmother        dx in her 52s   Thyroid cancer Paternal Grandmother        dx in her 28s   Miscarriages / Stillbirths Cousin    Diabetes Cousin    Breast cancer Cousin 34   Heart disease Other    Esophageal cancer Neg Hx    Stomach cancer Neg Hx    Rectal cancer Neg Hx    Social History  Socioeconomic History   Marital status: Married    Spouse name: Not on file   Number of children: 1   Years of education: Not on file   Highest education level: Not on file  Occupational History   Occupation: Unemployed    Employer: UNEMPLOYED  Tobacco Use   Smoking status: Every Day    Packs/day: 1.00    Years: 56.00    Additional pack years: 0.00    Total pack years: 56.00    Types: Cigarettes   Smokeless tobacco: Never   Tobacco comments:    Pt states that she smokes 1 ppd. ALS 07/14/22  Vaping Use   Vaping Use: Never used  Substance and Sexual Activity   Alcohol use: Not Currently   Drug use: No   Sexual activity: Yes  Other Topics Concern   Not on file  Social History Narrative   Daily Caffeine Use:  2 daily   Son in [place]   Sister in [place] Strain: Low Risk  (01/04/2023)   Overall Financial Resource Strain (CARDIA)    Difficulty of Paying Living Expenses: Not hard at all  Food Insecurity: No Food Insecurity (01/04/2023)   Hunger Vital Sign    Worried About Running Out of Food in the Last Year: Never true    Ran Out of Food in the Last Year: Never true  Transportation Needs: No Transportation Needs (12/31/2021)   PRAPARE - Hydrologist (Medical): No    Lack of Transportation (Non-Medical): No  Physical Activity: Inactive (01/04/2023)   Exercise Vital Sign    Days of Exercise per Week: 0 days    Minutes of  Exercise per Session: 0 min  Stress: No Stress Concern Present (01/04/2023)   [place]    Feeling of Stress : Only a little  Social Connections: Moderately Isolated (01/04/2023)   Social Connection and Isolation Panel [NHANES]    Frequency of Communication with Friends and Family: Patient unable to answer    Frequency of Social Gatherings with Friends and Family: Never    Attends Religious Services: Never    Marine scientist or Organizations: No    Attends Music therapist: Never    Marital Status: Married    Tobacco Counseling Ready to quit: Not Answered Counseling given: Not Answered Tobacco comments: Pt states that she smokes 1 ppd. ALS 07/14/22   Clinical Intake:  Pre-visit preparation completed: Yes  Pain : No/denies pain     Nutritional Risks: None Diabetes: No  How often do you need to have someone help you when you read instructions, pamphlets, or other written materials from your doctor or pharmacy?: 1 - Never  Diabetic? No   Interpreter Needed?: No  Information entered by :: C.Ysela Hettinger LPN   Activities of Daily Living    01/04/2023    8:28 AM  In your present state of health, do you have any difficulty performing the following activities:  Hearing? 0  Vision? 0  Difficulty concentrating or making decisions? 0  Walking or climbing stairs? 0  Dressing or bathing? 0  Doing errands, shopping? 0  Preparing Food and eating ? N  Using the Toilet? N  In the past six months, have you accidently leaked urine? Y  Comment Occasinaly when sneezes or coughs  Do you have problems with loss of bowel control? N  Managing your Medications? N  Managing your Finances? N  Housekeeping or managing your Housekeeping? N    Patient Care Team: Tower, Wynelle Fanny, MD as PCP - General Minus Breeding, MD as PCP - Cardiology (Cardiology) Leandrew Koyanagi, MD as Referring Physician  (Ophthalmology) Mauri Pole, MD as Consulting Physician (Gastroenterology) Nicholas Lose, MD as Consulting Physician (Hematology and Oncology) Jovita Kussmaul, MD as Consulting Physician (General Surgery)  Indicate any recent Medical Services you may have received from other than [facility] providers in the past year (date may be approximate).     Assessment:   This is a routine wellness examination for Ste[place].  Hearing/Vision screen Hearing Screening - Comments:: No aid Vision Screening - Comments:: Readers - Dr.Brasington  Dietary issues and exercise activities discussed: Current Exercise Habits: The patient does not participate in regular exercise at present, Exercise limited by: None identified   Goals Addressed             This Visit's Progress    Patient Stated       Exercise more.       Depression Screen    01/04/2023    8:25 AM 12/31/2021   11:31 AM 12/11/2019    2:51 PM 11/30/2018   11:17 AM 02/15/2018   10:43 AM 11/26/2017   10:31 AM 11/20/2016    1:01 PM  PHQ 2/9 Scores  PHQ - 2 Score 0 2 4 0 0 0 0  PHQ- 9 Score  2 4 0  0     Fall Risk    01/04/2023    8:27 AM 12/31/2021   11:24 AM 01/08/2021    2:53 PM 06/11/2020   10:49 AM 12/11/2019    2:49 PM  [place] in the past year? 0 0 0 0 0  Number falls in past yr: 0 0 0 0 0  Injury with Fall? 0 0  0 0  Risk for fall due to : No Fall Risks No Fall Risks   No Fall Risks  Follow up Falls prevention discussed;Falls evaluation completed Falls prevention discussed  Falls evaluation completed Falls evaluation completed;Falls prevention discussed    FALL RISK PREVENTION PERTAINING TO THE HOME:  Any stairs in or around the home? Yes  If so, are there any without handrails? Yes  Home free of loose throw rugs in walkways, pet beds, electrical cords, etc? Yes  Adequate lighting in your home to reduce risk of falls? Yes   ASSISTIVE DEVICES UTILIZED TO PREVENT FALLS:  Life alert? No  Use of a cane, walker or  w/c? No  Grab bars in the bathroom? Yes  Shower chair or bench in shower? No  Elevated toilet seat or a handicapped toilet? Yes    Cognitive Function:    12/11/2019    2:55 PM 11/30/2018   11:17 AM 11/26/2017   10:31 AM 11/20/2016    1:01 PM  MMSE - Mini Mental State Exam  Orientation to time 5 5 5 5   Orientation to Place 5 5 5 5   Registration 3 3 3 3   Attention/ Calculation 5 0 0 0  Recall 3 3 3 3   Language- name 2 objects  0 0 0  Language- repeat 1 1 1 1   Language- follow 3 step command  3 3 3   Language- read & follow direction  0 0 0  Write a sentence  0 0 0  Copy design  0 0 0  Total score  20 20 20  01/04/2023    8:29 AM  6CIT Screen  What Year? 0 points  What month? 0 points  What time? 0 points  Count back from 20 0 points  Months in reverse 0 points  Repeat phrase 0 points  Total Score 0 points    Immunizations Immunization History  Administered Date(s) Administered   Influenza Split 07/27/2011, 07/12/2013   Influenza Whole 08/08/2002, 07/20/2007   Influenza, High Dose Seasonal PF 08/01/2015, 07/12/2017, 07/22/2018, 07/17/2019, 07/09/2020   Influenza, Seasonal, Injecte, Preservative Fre 08/04/2016   Influenza-Unspecified 07/13/2014, 08/14/2021   PFIZER(Purple Top)SARS-COV-2 Vaccination 01/04/2020, 01/25/2020   Pneumococcal Conjugate-13 11/19/2014   Pneumococcal Polysaccharide-23 05/01/2005, 11/01/2012   Td 03/13/1999, 07/24/2010   Zoster, Live 08/12/2014    TDAP status: Due, Education has been provided regarding the importance of this vaccine. Advised may receive this vaccine at local pharmacy or Health Dept. Aware to provide a copy of the vaccination record if obtained from local pharmacy or Health Dept. Verbalized acceptance and understanding.  Flu Vaccine status: Up to date [facility]  Pneumococcal vaccine status: Up to date  Covid-19 vaccine status: Information provided on how to obtain vaccines.   Qualifies for Shingles Vaccine? Yes    Zostavax completed No   Shingrix Completed?: No.    Education has been provided regarding the importance of this vaccine. Patient has been advised to call insurance company to determine out of pocket expense if they have not yet received this vaccine. Advised may also receive vaccine at local pharmacy or Health Dept. Verbalized acceptance and understanding.  Screening Tests Health Maintenance  Topic Date Due   Zoster Vaccines- Shingrix (1 of 2) Never done   COVID-19 Vaccine (3 - Pfizer risk series) 02/22/2020   DTaP/Tdap/Td (3 - Tdap) 07/24/2020   INFLUENZA VACCINE  05/12/2022   MAMMOGRAM  08/22/2023   Lung Cancer Screening  10/08/2023   Medicare Annual Wellness (AWV)  01/04/2024   COLONOSCOPY (Pts 45-48yrs Insurance coverage will need to be confirmed)  05/15/2024   Pneumonia Vaccine 86+ Years old  Completed   DEXA SCAN  Completed   Hepatitis C Screening  Completed   HPV VACCINES  Aged Out    Health Maintenance  Health Maintenance Due  Topic Date Due   Zoster Vaccines- Shingrix (1 of 2) Never done   COVID-19 Vaccine (3 - Pfizer risk series) 02/22/2020   DTaP/Tdap/Td (3 - Tdap) 07/24/2020   INFLUENZA VACCINE  05/12/2022    Colorectal cancer screening: No longer required.   Mammogram status: Completed 08/21/22. Repeat every year  Bone Density status: Completed 12/21/22. Results reflect: Bone density results: OSTEOPENIA. Repeat every 2 years.  Lung Cancer Screening: (Low Dose CT Chest recommended if Age 36-80 years, 30 pack-year currently smoking OR have quit w/in 15years.) does qualify.   Lung Cancer Screening Referral: placed 10/23/22  Additional Screening:  Hepatitis C Screening: does not qualify; Completed 11/20/16  Vision Screening: Recommended annual ophthalmology exams for early detection of glaucoma and other disorders of the eye. Is the patient up to date with their annual eye exam?  Yes  Who is the provider or what is the name of the office in which the patient  attends annual eye exams? Dr.Brasington If pt is not established with a provider, would they like to be referred to a provider to establish care? No .   Dental Screening: Recommended annual dental exams for proper oral hygiene  Community Resource Referral / Chronic Care Management: CRR required this visit?  No   CCM required this  visit?  No      Plan:     I have personally reviewed and noted the following in the patient's chart:   Medical and social history Use of alcohol, tobacco or illicit drugs  Current medications and supplements including opioid prescriptions. Patient is not currently taking opioid prescriptions. Functional ability and status Nutritional status Physical activity Advanced directives List of other physicians Hospitalizations, surgeries, and ER visits in previous 12 months Vitals Screenings to include cognitive, depression, and falls Referrals and appointments  In addition, I have reviewed and discussed with patient certain preventive protocols, quality metrics, and best practice recommendations. A written personalized care plan for preventive services as well as general preventive health recommendations were provided to patient.     Lebron Conners, LPN   [id number]   Nurse Notes: none

## 2023-01-19 ENCOUNTER — Telehealth: Payer: Self-pay | Admitting: Family Medicine

## 2023-01-19 DIAGNOSIS — C44519 Basal cell carcinoma of skin of other part of trunk: Secondary | ICD-10-CM | POA: Diagnosis not present

## 2023-01-19 DIAGNOSIS — I2510 Atherosclerotic heart disease of native coronary artery without angina pectoris: Secondary | ICD-10-CM

## 2023-01-19 DIAGNOSIS — R5382 Chronic fatigue, unspecified: Secondary | ICD-10-CM

## 2023-01-19 DIAGNOSIS — E7800 Pure hypercholesterolemia, unspecified: Secondary | ICD-10-CM

## 2023-01-19 DIAGNOSIS — L718 Other rosacea: Secondary | ICD-10-CM | POA: Diagnosis not present

## 2023-01-19 DIAGNOSIS — R7303 Prediabetes: Secondary | ICD-10-CM

## 2023-01-19 DIAGNOSIS — D485 Neoplasm of uncertain behavior of skin: Secondary | ICD-10-CM | POA: Diagnosis not present

## 2023-01-19 DIAGNOSIS — L570 Actinic keratosis: Secondary | ICD-10-CM | POA: Diagnosis not present

## 2023-01-19 DIAGNOSIS — C44629 Squamous cell carcinoma of skin of left upper limb, including shoulder: Secondary | ICD-10-CM | POA: Diagnosis not present

## 2023-01-19 DIAGNOSIS — E039 Hypothyroidism, unspecified: Secondary | ICD-10-CM

## 2023-01-19 DIAGNOSIS — D225 Melanocytic nevi of trunk: Secondary | ICD-10-CM | POA: Diagnosis not present

## 2023-01-19 DIAGNOSIS — L700 Acne vulgaris: Secondary | ICD-10-CM | POA: Diagnosis not present

## 2023-01-19 DIAGNOSIS — L821 Other seborrheic keratosis: Secondary | ICD-10-CM | POA: Diagnosis not present

## 2023-01-19 DIAGNOSIS — L814 Other melanin hyperpigmentation: Secondary | ICD-10-CM | POA: Diagnosis not present

## 2023-01-19 NOTE — Telephone Encounter (Signed)
-----   Message from Alvina Chou sent at 01/11/2023  9:55 AM EDT ----- Regarding: Lab orders for Friday, 4.12.24 Patient is scheduled for CPX labs, please order future labs, Thanks , Camelia Eng

## 2023-01-22 ENCOUNTER — Other Ambulatory Visit (INDEPENDENT_AMBULATORY_CARE_PROVIDER_SITE_OTHER): Payer: Medicare Other

## 2023-01-22 DIAGNOSIS — E039 Hypothyroidism, unspecified: Secondary | ICD-10-CM | POA: Diagnosis not present

## 2023-01-22 DIAGNOSIS — E7800 Pure hypercholesterolemia, unspecified: Secondary | ICD-10-CM | POA: Diagnosis not present

## 2023-01-22 DIAGNOSIS — R7303 Prediabetes: Secondary | ICD-10-CM | POA: Diagnosis not present

## 2023-01-22 DIAGNOSIS — R5382 Chronic fatigue, unspecified: Secondary | ICD-10-CM | POA: Diagnosis not present

## 2023-01-22 NOTE — Addendum Note (Signed)
Addended by: Alvina Chou on: 01/22/2023 02:26 PM   Modules accepted: Orders

## 2023-01-23 LAB — CBC WITH DIFFERENTIAL/PLATELET
Absolute Monocytes: 821 cells/uL (ref 200–950)
Basophils Absolute: 58 cells/uL (ref 0–200)
Basophils Relative: 0.8 %
Eosinophils Absolute: 238 cells/uL (ref 15–500)
Eosinophils Relative: 3.3 %
HCT: 43.7 % (ref 35.0–45.0)
Hemoglobin: 14.7 g/dL (ref 11.7–15.5)
Lymphs Abs: 2088 cells/uL (ref 850–3900)
MCH: 31.9 pg (ref 27.0–33.0)
MCHC: 33.6 g/dL (ref 32.0–36.0)
MCV: 94.8 fL (ref 80.0–100.0)
MPV: 10 fL (ref 7.5–12.5)
Monocytes Relative: 11.4 %
Neutro Abs: 3996 cells/uL (ref 1500–7800)
Neutrophils Relative %: 55.5 %
Platelets: 281 10*3/uL (ref 140–400)
RBC: 4.61 10*6/uL (ref 3.80–5.10)
RDW: 12.4 % (ref 11.0–15.0)
Total Lymphocyte: 29 %
WBC: 7.2 10*3/uL (ref 3.8–10.8)

## 2023-01-23 LAB — COMPREHENSIVE METABOLIC PANEL
AG Ratio: 1.3 (calc) (ref 1.0–2.5)
ALT: 11 U/L (ref 6–29)
AST: 22 U/L (ref 10–35)
Albumin: 3.7 g/dL (ref 3.6–5.1)
Alkaline phosphatase (APISO): 75 U/L (ref 37–153)
BUN: 16 mg/dL (ref 7–25)
CO2: 26 mmol/L (ref 20–32)
Calcium: 9.1 mg/dL (ref 8.6–10.4)
Chloride: 103 mmol/L (ref 98–110)
Creat: 0.82 mg/dL (ref 0.60–1.00)
Globulin: 2.9 g/dL (calc) (ref 1.9–3.7)
Glucose, Bld: 100 mg/dL — ABNORMAL HIGH (ref 65–99)
Potassium: 4.3 mmol/L (ref 3.5–5.3)
Sodium: 139 mmol/L (ref 135–146)
Total Bilirubin: 0.4 mg/dL (ref 0.2–1.2)
Total Protein: 6.6 g/dL (ref 6.1–8.1)

## 2023-01-23 LAB — LIPID PANEL
Cholesterol: 137 mg/dL (ref <200)
HDL: 70 mg/dL (ref >=50)
LDL Cholesterol (Calc): 52 mg/dL (calc)
Non-HDL Cholesterol (Calc): 67 mg/dL (calc) (ref <130)
Total CHOL/HDL Ratio: 2 (calc) (ref <5.0)
Triglycerides: 70 mg/dL (ref <150)

## 2023-01-23 LAB — HEMOGLOBIN A1C
Hgb A1c MFr Bld: 6 % of total Hgb — ABNORMAL HIGH (ref <5.7)
Mean Plasma Glucose: 126 mg/dL
eAG (mmol/L): 7 mmol/L

## 2023-01-23 LAB — TSH: TSH: 1.86 mIU/L (ref 0.40–4.50)

## 2023-01-29 ENCOUNTER — Ambulatory Visit (INDEPENDENT_AMBULATORY_CARE_PROVIDER_SITE_OTHER): Payer: Medicare Other | Admitting: Family Medicine

## 2023-01-29 ENCOUNTER — Encounter: Payer: Self-pay | Admitting: Family Medicine

## 2023-01-29 VITALS — BP 130/68 | HR 83 | Temp 97.6°F | Ht 62.0 in | Wt 105.0 lb

## 2023-01-29 DIAGNOSIS — M8589 Other specified disorders of bone density and structure, multiple sites: Secondary | ICD-10-CM

## 2023-01-29 DIAGNOSIS — D0462 Carcinoma in situ of skin of left upper limb, including shoulder: Secondary | ICD-10-CM | POA: Diagnosis not present

## 2023-01-29 DIAGNOSIS — F17200 Nicotine dependence, unspecified, uncomplicated: Secondary | ICD-10-CM

## 2023-01-29 DIAGNOSIS — E039 Hypothyroidism, unspecified: Secondary | ICD-10-CM

## 2023-01-29 DIAGNOSIS — Z1211 Encounter for screening for malignant neoplasm of colon: Secondary | ICD-10-CM | POA: Diagnosis not present

## 2023-01-29 DIAGNOSIS — R7303 Prediabetes: Secondary | ICD-10-CM | POA: Diagnosis not present

## 2023-01-29 DIAGNOSIS — C44519 Basal cell carcinoma of skin of other part of trunk: Secondary | ICD-10-CM | POA: Diagnosis not present

## 2023-01-29 DIAGNOSIS — E7800 Pure hypercholesterolemia, unspecified: Secondary | ICD-10-CM

## 2023-01-29 DIAGNOSIS — L231 Allergic contact dermatitis due to adhesives: Secondary | ICD-10-CM | POA: Diagnosis not present

## 2023-01-29 DIAGNOSIS — F418 Other specified anxiety disorders: Secondary | ICD-10-CM

## 2023-01-29 NOTE — Progress Notes (Unsigned)
Subjective:    Patient ID: Cheryl Aguirre, female    DOB: 1945/12/10, 77 y.o.   MRN: [id number]  HPI Pt presents for annual f/u of chronic health problems  Wt Readings from Last 3 Encounters:  01/29/23 105 lb (47.6 kg)  01/04/23 105 lb (47.6 kg)  12/31/22 107 lb 3.2 oz (48.6 kg)   19.20 kg/m  Vitals:   01/29/23 1412  BP: 130/68  Pulse: 83  Temp: 97.6 F (36.4 C)  SpO2: 98%    Doing well overall  Taking care of herself    Immunization History  Administered Date(s) Administered   Influenza Split 07/27/2011, 07/12/2013   Influenza Whole 08/08/2002, 07/20/2007   Influenza, High Dose Seasonal PF 08/01/2015, 07/12/2017, 07/22/2018, 07/17/2019, 07/09/2020   Influenza, Seasonal, Injecte, Preservative Fre 08/04/2016   Influenza-Unspecified 07/13/2014, 08/14/2021   PFIZER(Purple Top)SARS-COV-2 Vaccination 01/04/2020, 01/25/2020   Pneumococcal Conjugate-13 11/19/2014   Pneumococcal Polysaccharide-23 05/01/2005, 11/01/2012   Td 03/13/1999, 07/24/2010   Zoster, Live 08/12/2014   Health Maintenance Due  Topic Date Due   Zoster Vaccines- Shingrix (1 of 2) Never done   COVID-19 Vaccine (3 - Pfizer risk series) 02/22/2020   DTaP/Tdap/Td (3 - Tdap) 07/24/2020   Tetanus -will get it at pharm   Shingrix- wants to get   Mammogram 08/2022 Personal h/o breast cancer  Self breast exam; no lumps  Got her 5 year visit Dr Ivy Lynn   Dexa 12/2022  Osteopenia  On tamoxifen  Falls: : none Fractures:none Supplements calcium and D Exercise : needs to add more training    Colonoscopy 05/2021 with 3 y recall  Father had colon cancer   Derm visits recently  Basal cell-back Squamous- L arm/wrist - had procedure today   Stays out of the sun now as much as she can  Tx for rosacea also- gel px pending   Lung cancer screening 09/2022  Smoking status : same  Not ready to quit yet    Mood     01/29/2023    2:19 PM 01/04/2023    8:25 AM 12/31/2021   11:31 AM 12/11/2019     2:51 PM 11/30/2018   11:17 AM  Depression screen PHQ 2/9  Decreased Interest 0 0 1 2 0  Down, Depressed, Hopeless 0 0 1 2 0  PHQ - 2 Score 0 0 2 4 0  Altered sleeping 0  0 0 0  Tired, decreased energy 0  0 0 0  Change in appetite 0  0 0 0  Feeling bad or failure about yourself  0  0 0 0  Trouble concentrating 0  0 0 0  Moving slowly or fidgety/restless 0  0 0 0  Suicidal thoughts 0  0 0 0  PHQ-9 Score 0  2 4 0  Difficult doing work/chores Not difficult at all  Somewhat difficult Not difficult at all Not difficult at all   Has been a lot going on  Has hope/light at the end of the tunnel   Lexapro 20 mg daily   Hypothyroidism  Pt has no clinical changes No change in energy level/ hair or skin/ edema and no tremor Lab Results  Component Value Date   TSH 1.86 01/22/2023    Levothyroxine 25 mcg daily   Prediabetes Lab Results  Component Value Date   HGBA1C 6.0 (H) 01/22/2023   Up from 5.9  Has a hard time keeping weight on   Smaller meals Better carbs Makes herself eat protein    Hyperlipidemia Lab  Results  Component Value Date   CHOL 137 01/22/2023   CHOL 167 12/29/2021   CHOL 162 01/06/2021   Lab Results  Component Value Date   HDL 70 01/22/2023   HDL 73.00 12/29/2021   HDL 69.30 01/06/2021   Lab Results  Component Value Date   LDLCALC 52 01/22/2023   LDLCALC 79 12/29/2021   LDLCALC 78 01/06/2021   Lab Results  Component Value Date   TRIG 70 01/22/2023   TRIG 76.0 12/29/2021   TRIG 73.0 01/06/2021   Lab Results  Component Value Date   CHOLHDL 2.0 01/22/2023   CHOLHDL 2 12/29/2021   CHOLHDL 2 01/06/2021   Lab Results  Component Value Date   LDLDIRECT 138.2 11/01/2013   LDLDIRECT 109.6 10/26/2012   Diet controlled  The 10-year ASCVD risk score (Arnett DK, et al., 2019) is: 22.7%   Values used to calculate the score:     Age: 52 years     Sex: Female     Is Non-Hispanic African American: No     Diabetic: No     Tobacco smoker: Yes      Systolic Blood Pressure: 130 mmHg     Is BP treated: No     HDL Cholesterol: 70 mg/dL     Total Cholesterol: 137 mg/dL   Lab Results  Component Value Date   WBC 7.2 01/22/2023   HGB 14.7 01/22/2023   HCT 43.7 01/22/2023   MCV 94.8 01/22/2023   PLT 281 01/22/2023   Lab Results  Component Value Date   TSH 1.86 01/22/2023    Last metabolic panel Lab Results  Component Value Date   GLUCOSE 100 (H) 01/22/2023   NA 139 01/22/2023   K 4.3 01/22/2023   CL 103 01/22/2023   CO2 26 01/22/2023   BUN 16 01/22/2023   CREATININE 0.82 01/22/2023   GFRNONAA >60 01/21/2018   CALCIUM 9.1 01/22/2023   PROT 6.6 01/22/2023   ALBUMIN 3.8 12/29/2021   BILITOT 0.4 01/22/2023   ALKPHOS 65 12/29/2021   AST 22 01/22/2023   ALT 11 01/22/2023   ANIONGAP 11 01/21/2018   Patient Active Problem List   Diagnosis Date Noted   Coronary artery calcification 07/15/2022   Skin cancer screening 01/02/2022   Fatigue 12/28/2021   Dysphagia 01/08/2021   Allergic rhinitis 01/08/2021   Flank pain 01/08/2021   Hypothyroid 12/29/2019   Genetic testing 03/30/2018   Family history of breast cancer    Family history of thyroid cancer    Family history of pancreatic cancer    Malignant neoplasm of lower-inner quadrant of right breast of female, estrogen receptor positive 02/08/2018   Estrogen deficiency 11/23/2016   Prediabetes 11/14/2016   Screening for thyroid disorder 11/14/2016   Family history of colon cancer 11/22/2015   Colon cancer screening 11/22/2015   Encounter for Medicare annual wellness exam 10/31/2013   Headache(784.0) 11/01/2012   Gynecologic exam normal 09/15/2011   Routine general medical examination at a health care facility 09/07/2011   ONYCHOMYCOSIS 07/24/2010   Pure hypercholesterolemia 07/21/2010   COLONIC POLYPS, HYPERPLASTIC, HX OF 02/18/2010   Nonspecific (abnormal) findings on radiological and other examination of body structure 08/02/2008   NONSPCIFC ABN FINDING RAD & OTH  EXAM LUNG FIELD 08/02/2008   Osteopenia 07/20/2007   TOBACCO ABUSE 02/11/2007   Depression with anxiety 02/11/2007   GERD 02/11/2007   Fibrocystic breast changes 02/11/2007   Past Medical History:  Diagnosis Date   Anxiety    Arthritis  left hip   Breast cancer    Cancer 2019   breast   Chronic headaches    Colon polyps    Depression    Family history of breast cancer    Family history of colon cancer    Family history of pancreatic cancer    Family history of thyroid cancer    GERD (gastroesophageal reflux disease)    Hypertension    elevated at times.  no meds   IBS (irritable bowel syndrome)    Insomnia    Osteopenia    Personal history of radiation therapy    2019   Tobacco abuse    Past Surgical History:  Procedure Laterality Date   ABDOMINAL HYSTERECTOMY     bleeding, fibroid, endometriosis (total)   BASAL CELL CARCINOMA EXCISION  11/22/2018   BASAL CELL CARCINOMA EXCISION  07/12/2018   BREAST LUMPECTOMY Right 01/2018   BREAST LUMPECTOMY WITH RADIOACTIVE SEED AND SENTINEL LYMPH NODE BIOPSY Right 01/31/2018   Procedure: BREAST LUMPECTOMY WITH RADIOACTIVE SEED AND SENTINEL LYMPH NODE BIOPSY;  Surgeon: Griselda Miner, MD;  Location: [facility] OR;  Service: General;  Laterality: Right;   BREAST SURGERY  2019   CATARACT EXTRACTION W/PHACO Left 02/11/2017   Procedure: CATARACT EXTRACTION PHACO AND INTRAOCULAR LENS PLACEMENT (IOC);  Surgeon: Lockie Mola, MD;  Location: [facility];  Service: Ophthalmology;  Laterality: Left;  [place] 1:02.8 AP% 13.7 CDE 8.58 FLUID PACK LOT # [id number] H   CATARACT EXTRACTION W/PHACO Right 03/10/2017   Procedure: CATARACT EXTRACTION PHACO AND INTRAOCULAR LENS PLACEMENT (IOC) Right symfony toric lens;  Surgeon: Lockie Mola, MD;  Location: [facility];  Service: Ophthalmology;  Laterality: Right;  symfony toric lens prefers early   CT of chest  10/09   bronchiectasis   ENDOMETRIAL BIOPSY     LAPAROSCOPY  1984   Social History    Tobacco Use   Smoking status: Every Day    Packs/day: 1.00    Years: 56.00    Additional pack years: 0.00    Total pack years: 56.00    Types: Cigarettes   Smokeless tobacco: Never   Tobacco comments:    Pt states that she smokes 1 ppd. ALS 07/14/22  Vaping Use   Vaping Use: Never used  Substance Use Topics   Alcohol use: Not Currently   Drug use: No   Family History  Problem Relation Age of Onset   Cancer Mother        pancreatic, liver   Cancer Father        colon   Colon cancer Father 23   Thyroid disease Sister    Colon polyps Sister    Irritable bowel syndrome Sister    Breast cancer Sister 50   Melanoma Sister 23   Lung cancer Sister 62   Thyroid disease Sister    Thyroid disease Sister    Skin cancer Brother    Colon cancer Paternal Grandmother        dx in her 55s   Thyroid cancer Paternal Grandmother        dx in her 28s   Breast cancer Maternal Aunt        dx under 50   Glaucoma Maternal Aunt    Miscarriages / Stillbirths Cousin    Diabetes Cousin    Breast cancer Cousin 50   Heart disease Other    Esophageal cancer Neg Hx    Stomach cancer Neg Hx    Rectal cancer Neg Hx    Allergies  Allergen Reactions   Adhesive [Tape]     Tears skin   Alendronate Sodium     REACTION: muscle, joint pain, indigestion   Penicillins Other (See Comments)    Has patient had a PCN reaction causing immediate rash, facial/tongue/throat swelling, SOB or lightheadedness with hypotension: yes Has patient had a PCN reaction causing severe rash involving mucus membranes or skin necrosis: no Has patient had a PCN reaction that required hospitalization: no Has patient had a PCN reaction occurring within the last 10 years: no If all of the above answers are "NO", then may proceed with Cephalosporin use. **CHILDHOOD**   Varenicline Tartrate Nausea Only        Doxycycline Nausea And Vomiting and Rash        Current Outpatient Medications on File Prior to Visit   Medication Sig Dispense Refill   acetaminophen (TYLENOL) 500 MG tablet Take 500 mg by mouth every 8 (eight) hours as needed.     calcium carbonate (TUMS - DOSED IN MG ELEMENTAL CALCIUM) 500 MG chewable tablet Chew 1 tablet by mouth as needed for indigestion or heartburn.     cetirizine (ZYRTEC) 10 MG tablet Take 10 mg by mouth daily.     cyclobenzaprine (FLEXERIL) 10 MG tablet Take 1 tablet (10 mg total) by mouth at bedtime as needed (migraine). 90 tablet 1   escitalopram (LEXAPRO) 20 MG tablet TAKE 1 TABLET DAILY 90 tablet 0   famotidine (PEPCID) 20 MG tablet Take 1 tablet (20 mg total) by mouth 2 (two) times daily. 180 tablet 3   fish oil-omega-3 fatty acids 1000 MG capsule Take 1 g by mouth daily.     Lactobacillus (ACIDOPHILUS) 100 MG CAPS Take 1 capsule by mouth daily.     levothyroxine (SYNTHROID) 25 MCG tablet Take 1 tablet (25 mcg total) by mouth daily before breakfast. 90 tablet 2   Magnesium 250 MG TABS Take 1 tablet by mouth daily as needed (constipation).      Melatonin 5 MG TABS Take 5 mg by mouth at bedtime. As directed at bedtime.     metroNIDAZOLE (METROCREAM) 0.75 % cream Apply 1 Application topically 2 (two) times daily.     montelukast (SINGULAIR) 10 MG tablet TAKE 1 TABLET AT BEDTIME 90 tablet 0   Multiple Vitamin (MULTIVITAMIN) tablet Take 1 tablet by mouth daily.     Multiple Vitamins-Minerals (PRESERVISION AREDS PO) Take by mouth.     rosuvastatin (CRESTOR) 10 MG tablet Take 1 tablet (10 mg total) by mouth daily. 90 tablet 3   Turmeric 500 MG CAPS Take 1 tablet by mouth daily.     Vitamin D-Vitamin K (VITAMIN K2-VITAMIN D3 PO) Take 1 capsule by mouth daily.     sodium chloride HYPERTONIC 3 % nebulizer solution Take by nebulization as needed for other. (Patient not taking: Reported on 01/29/2023) 360 mL 12   Current Facility-Administered Medications on File Prior to Visit  Medication Dose Route Frequency Provider Last Rate Last Admin   0.9 %  sodium chloride infusion   500 mL Intravenous Once Nandigam, Kavitha V, MD          Review of Systems  Constitutional:  Negative for activity change, appetite change, fatigue, fever and unexpected weight change.  HENT:  Negative for congestion, ear pain, rhinorrhea, sinus pressure and sore throat.   Eyes:  Negative for pain, redness and visual disturbance.  Respiratory:  Negative for cough, shortness of breath and wheezing.   Cardiovascular:  Negative for chest pain and  palpitations.  Gastrointestinal:  Negative for abdominal pain, blood in stool, constipation and diarrhea.  Endocrine: Negative for polydipsia and polyuria.  Genitourinary:  Negative for dysuria, frequency and urgency.  Musculoskeletal:  Negative for arthralgias, back pain and myalgias.  Skin:  Negative for pallor and rash.       Multiple skin cancers - basal and squamous cell Having proceedures from dermatology   Allergic/Immunologic: Negative for environmental allergies.  Neurological:  Negative for dizziness, syncope and headaches.  Hematological:  Negative for adenopathy. Does not bruise/bleed easily.  Psychiatric/Behavioral:  Negative for decreased concentration and dysphoric mood. The patient is not nervous/anxious.        Objective:   Physical Exam Constitutional:      General: She is not in acute distress.    Appearance: Normal appearance. She is well-developed and normal weight. She is not ill-appearing or diaphoretic.  HENT:     Head: Normocephalic and atraumatic.     Right Ear: Tympanic membrane, ear canal and external ear normal.     Left Ear: Tympanic membrane, ear canal and external ear normal.     Nose: Nose normal. No congestion.     Mouth/Throat:     Mouth: Mucous membranes are moist.     Pharynx: Oropharynx is clear. No posterior oropharyngeal erythema.  Eyes:     General: No scleral icterus.    Extraocular Movements: Extraocular movements intact.     Conjunctiva/sclera: Conjunctivae normal.     Pupils: Pupils are  equal, round, and reactive to light.  Neck:     Thyroid: No thyromegaly.     Vascular: No carotid bruit or JVD.  Cardiovascular:     Rate and Rhythm: Normal rate and regular rhythm.     Pulses: Normal pulses.     Heart sounds: Normal heart sounds.     No gallop.  Pulmonary:     Effort: Pulmonary effort is normal. No respiratory distress.     Breath sounds: Normal breath sounds. No wheezing.     Comments: Good air exch Chest:     Chest wall: No tenderness.  Abdominal:     General: Bowel sounds are normal. There is no distension or abdominal bruit.     Palpations: Abdomen is soft. There is no mass.     Tenderness: There is no abdominal tenderness.     Hernia: No hernia is present.  Genitourinary:    Comments: Breast exam: No mass, nodules, thickening, tenderness, bulging, retraction, inflamation, nipple discharge or skin changes noted.  No axillary or clavicular LA.     Musculoskeletal:        General: No tenderness. Normal range of motion.     Cervical back: Normal range of motion and neck supple. No rigidity. No muscular tenderness.     Right lower leg: No edema.     Left lower leg: No edema.     Comments: No kyphosis   Lymphadenopathy:     Cervical: No cervical adenopathy.  Skin:    General: Skin is warm and dry.     Coloration: Skin is not pale.     Findings: No erythema or rash.     Comments: Solar lentigines diffusely Signs of solar damage  Recent skin porcedures noted   Neurological:     Mental Status: She is alert. Mental status is at baseline.     Cranial Nerves: No cranial nerve deficit.     Motor: No abnormal muscle tone.     Coordination: Coordination normal.  Gait: Gait normal.     Deep Tendon Reflexes: Reflexes are normal and symmetric. Reflexes normal.  Psychiatric:        Mood and Affect: Mood normal.        Cognition and Memory: Cognition and memory normal.           Assessment & Plan:   Problem List Items Addressed This Visit        Endocrine   Hypothyroid - Primary    Hypothyroidism  Pt has no clinical changes No change in energy level/ hair or skin/ edema and no tremor Lab Results  Component Value Date   TSH 1.86 01/22/2023     Continues levothyroxine 25 mcg daily        Musculoskeletal and Integument   Osteopenia    Dexa 12/2022 Taking tamoxifen No falls or fractures Taking ca and d  Enc smoking cessation Disc imp of strength building exercise         Other   Colon cancer screening    Colonoscopy 05/2021 with 3 y recall      Depression with anxiety    Mood is stalble PHQ score of 0 Continues lexapro which she wants to continue      Prediabetes    Lab Results  Component Value Date   HGBA1C 6.0 (H) 01/22/2023  disc imp of low glycemic diet and wt loss to prevent DM2       Pure hypercholesterolemia    Disc goals for lipids and reasons to control them Rev last labs with pt Rev low sat fat diet in detail Diet controlled LDL is 52 - improved       TOBACCO ABUSE    Ready to quit: No Counseling given: Yes Tobacco comments: Pt states that she smokes 1 ppd. ALS 07/14/22  Disc in detail risks of smoking and possible outcomes including copd, vascular/ heart disease, cancer , respiratory and sinus infections  Pt voices understanding

## 2023-01-29 NOTE — Patient Instructions (Addendum)
You are due for a tetanus shot at the pharmacy or health dept   If you are interested in the new shingles vaccine (Shingrix) - call your local pharmacy to check on coverage and availability  If affordable, get on a wait list at your pharmacy to get the vaccine.   Add some strength training to your routine, this is important for bone and brain health and can reduce your risk of falls and help your body use insulin properly and regulate weight  Light weights, exercise bands , and internet videos are a good way to start  Yoga (chair or regular), machines , floor exercises or a gym with machines are also good options    Continue sun protection   Continue dermatology follow ups   Consider quitting smoking

## 2023-01-31 MED ORDER — ESCITALOPRAM OXALATE 20 MG PO TABS: 20.0000 mg | ORAL_TABLET | Freq: Every day | ORAL | 3 refills | Status: DC

## 2023-01-31 MED ORDER — MONTELUKAST SODIUM 10 MG PO TABS: 10.0000 mg | ORAL_TABLET | Freq: Every day | ORAL | 3 refills | Status: DC

## 2023-01-31 NOTE — Assessment & Plan Note (Signed)
Colonoscopy 05/2021 with 3 y recall

## 2023-01-31 NOTE — Assessment & Plan Note (Signed)
Disc goals for lipids and reasons to control them Rev last labs with pt Rev low sat fat diet in detail Diet controlled LDL is 52 - improved

## 2023-01-31 NOTE — Assessment & Plan Note (Signed)
Ready to quit: No Counseling given: Yes Tobacco comments: Pt states that she smokes 1 ppd. ALS 07/14/22  Disc in detail risks of smoking and possible outcomes including copd, vascular/ heart disease, cancer , respiratory and sinus infections  Pt voices understanding

## 2023-01-31 NOTE — Assessment & Plan Note (Signed)
Dexa 12/2022 Taking tamoxifen No falls or fractures Taking ca and d  Enc smoking cessation Disc imp of strength building exercise

## 2023-01-31 NOTE — Assessment & Plan Note (Signed)
Mood is stalble PHQ score of 0 Continues lexapro which she wants to continue

## 2023-01-31 NOTE — Assessment & Plan Note (Signed)
Hypothyroidism  Pt has no clinical changes No change in energy level/ hair or skin/ edema and no tremor Lab Results  Component Value Date   TSH 1.86 01/22/2023     Continues levothyroxine 25 mcg daily

## 2023-01-31 NOTE — Assessment & Plan Note (Signed)
Lab Results  Component Value Date   HGBA1C 6.0 (H) 01/22/2023   disc imp of low glycemic diet and wt loss to prevent DM2

## 2023-02-19 DIAGNOSIS — D3132 Benign neoplasm of left choroid: Secondary | ICD-10-CM | POA: Diagnosis not present

## 2023-02-19 DIAGNOSIS — H43811 Vitreous degeneration, right eye: Secondary | ICD-10-CM | POA: Diagnosis not present

## 2023-02-19 DIAGNOSIS — Z961 Presence of intraocular lens: Secondary | ICD-10-CM | POA: Diagnosis not present

## 2023-04-12 ENCOUNTER — Ambulatory Visit
Admission: RE | Admit: 2023-04-12 | Discharge: 2023-04-12 | Disposition: A | Discharge status: Home / Self Care | Payer: TRICARE For Life (TFL) | Source: Ambulatory Visit | Attending: Acute Care | Admitting: Acute Care

## 2023-04-12 DIAGNOSIS — J479 Bronchiectasis, uncomplicated: Secondary | ICD-10-CM | POA: Diagnosis not present

## 2023-04-12 DIAGNOSIS — F17210 Nicotine dependence, cigarettes, uncomplicated: Secondary | ICD-10-CM | POA: Diagnosis not present

## 2023-04-12 DIAGNOSIS — R911 Solitary pulmonary nodule: Secondary | ICD-10-CM

## 2023-04-12 DIAGNOSIS — R918 Other nonspecific abnormal finding of lung field: Secondary | ICD-10-CM | POA: Diagnosis not present

## 2023-04-12 DIAGNOSIS — Z87891 Personal history of nicotine dependence: Secondary | ICD-10-CM

## 2023-04-23 DIAGNOSIS — D485 Neoplasm of uncertain behavior of skin: Secondary | ICD-10-CM | POA: Diagnosis not present

## 2023-04-23 DIAGNOSIS — L538 Other specified erythematous conditions: Secondary | ICD-10-CM | POA: Diagnosis not present

## 2023-04-23 DIAGNOSIS — Z08 Encounter for follow-up examination after completed treatment for malignant neoplasm: Secondary | ICD-10-CM | POA: Diagnosis not present

## 2023-04-23 DIAGNOSIS — Z85828 Personal history of other malignant neoplasm of skin: Secondary | ICD-10-CM | POA: Diagnosis not present

## 2023-04-23 DIAGNOSIS — L718 Other rosacea: Secondary | ICD-10-CM | POA: Diagnosis not present

## 2023-04-30 ENCOUNTER — Telehealth: Payer: Self-pay | Admitting: Acute Care

## 2023-04-30 NOTE — Telephone Encounter (Signed)
This scan is fine as a LR 2, 12 month follow up. She has a multiple nodules that wax and wane due to her bronchiectasis. Please call her and let her know the results. She is followed by Dr. Judeth Horn for her bronchiectasis 12 month follow up, please fax results to PCP. Thanks so much

## 2023-05-03 ENCOUNTER — Other Ambulatory Visit: Payer: Self-pay | Admitting: Acute Care

## 2023-05-03 DIAGNOSIS — Z87891 Personal history of nicotine dependence: Secondary | ICD-10-CM

## 2023-05-03 DIAGNOSIS — F17210 Nicotine dependence, cigarettes, uncomplicated: Secondary | ICD-10-CM

## 2023-05-03 DIAGNOSIS — Z122 Encounter for screening for malignant neoplasm of respiratory organs: Secondary | ICD-10-CM

## 2023-05-03 NOTE — Telephone Encounter (Signed)
Results / plans faxed to PCP. Order placed for 12 mth screening lung scan. Result letter sent to patient.

## 2023-05-07 ENCOUNTER — Other Ambulatory Visit: Payer: Self-pay | Admitting: Family Medicine

## 2023-05-31 ENCOUNTER — Other Ambulatory Visit: Payer: Self-pay | Admitting: Family Medicine

## 2023-05-31 DIAGNOSIS — M5431 Sciatica, right side: Secondary | ICD-10-CM

## 2023-06-01 NOTE — Telephone Encounter (Signed)
CPE was on 01/29/23. Last filled on 01/02/22 #90 tab/ 1 refll

## 2023-06-03 ENCOUNTER — Other Ambulatory Visit: Payer: Self-pay | Admitting: Cardiology

## 2023-08-12 ENCOUNTER — Other Ambulatory Visit: Payer: Self-pay | Admitting: Family Medicine

## 2023-08-12 DIAGNOSIS — Z1231 Encounter for screening mammogram for malignant neoplasm of breast: Secondary | ICD-10-CM

## 2023-09-02 ENCOUNTER — Ambulatory Visit
Admission: RE | Admit: 2023-09-02 | Discharge: 2023-09-02 | Disposition: A | Discharge status: Home / Self Care | Payer: Medicare Other | Source: Ambulatory Visit | Attending: Family Medicine | Admitting: Family Medicine

## 2023-09-02 DIAGNOSIS — Z1231 Encounter for screening mammogram for malignant neoplasm of breast: Secondary | ICD-10-CM

## 2023-11-03 ENCOUNTER — Other Ambulatory Visit: Payer: Self-pay | Admitting: Family Medicine

## 2024-01-05 ENCOUNTER — Telehealth: Payer: Self-pay | Admitting: Family Medicine

## 2024-01-05 ENCOUNTER — Ambulatory Visit (INDEPENDENT_AMBULATORY_CARE_PROVIDER_SITE_OTHER): Payer: TRICARE For Life (TFL)

## 2024-01-05 VITALS — Ht 62.0 in | Wt 105.0 lb

## 2024-01-05 DIAGNOSIS — R7303 Prediabetes: Secondary | ICD-10-CM

## 2024-01-05 DIAGNOSIS — E7800 Pure hypercholesterolemia, unspecified: Secondary | ICD-10-CM

## 2024-01-05 DIAGNOSIS — E039 Hypothyroidism, unspecified: Secondary | ICD-10-CM

## 2024-01-05 DIAGNOSIS — Z0000 Encounter for general adult medical examination without abnormal findings: Secondary | ICD-10-CM

## 2024-01-05 DIAGNOSIS — K219 Gastro-esophageal reflux disease without esophagitis: Secondary | ICD-10-CM

## 2024-01-05 DIAGNOSIS — I2510 Atherosclerotic heart disease of native coronary artery without angina pectoris: Secondary | ICD-10-CM

## 2024-01-05 DIAGNOSIS — R5382 Chronic fatigue, unspecified: Secondary | ICD-10-CM

## 2024-01-05 NOTE — Progress Notes (Signed)
 Subjective:   Cheryl Aguirre is a 78 y.o. who presents for a Medicare Wellness preventive visit.  Visit Complete: Virtual I connected with  [name] on 01/05/24 by a audio enabled telemedicine application and verified that I am speaking with the correct person using two identifiers.  Patient Location: Home  Provider Location: Office/Clinic  I discussed the limitations of evaluation and management by telemedicine. The patient expressed understanding and agreed to proceed.  Vital Signs: Because this visit was a virtual/telehealth visit, some criteria may be missing or patient reported. Any vitals not documented were not able to be obtained and vitals that have been documented are patient reported.  VideoDeclined- This patient declined Librarian, academic. Therefore the visit was completed with audio only.  Persons Participating in Visit: Patient.  AWV Questionnaire: Yes: Patient Medicare AWV questionnaire was completed by the patient on 01/01/24; I have confirmed that all information answered by patient is correct and no changes since this date.  Cardiac Risk Factors include: advanced age (>41men, >63 women);dyslipidemia;smoking/ tobacco exposure     Objective:    Today's Vitals   01/05/24 1028  Weight: 105 lb (47.6 kg)  Height: 5\' 2"  (1.575 m)   Body mass index is 19.2 kg/m.     01/05/2024   10:40 AM 01/04/2023    8:26 AM 12/31/2021   11:37 AM 12/11/2019    2:47 PM 11/30/2018   11:07 AM 05/02/2018    2:30 PM 02/24/2018    2:49 PM  Advanced Directives  Does Patient Have a Medical Advance Directive? Yes Yes Yes Yes Yes Yes Yes  Type of Estate agent of [place];Living will Healthcare Power of [place];Living will Healthcare Power of [place];Living will Healthcare Power of [place];Living will Healthcare Power of [place];Living will Healthcare Power of [place];Living will   Copy of Healthcare Power of Attorney  in Chart? Yes - validated most recent copy scanned in chart (See row information) No - copy requested Yes - validated most recent copy scanned in chart (See row information) No - copy requested Yes - validated most recent copy scanned in chart (See row information)      Current Medications (verified) Outpatient Encounter Medications as of 01/05/2024  Medication Sig   acetaminophen (TYLENOL) 500 MG tablet Take 500 mg by mouth every 8 (eight) hours as needed.   calcium carbonate (TUMS - DOSED IN MG ELEMENTAL CALCIUM) 500 MG chewable tablet Chew 1 tablet by mouth as needed for indigestion or heartburn.   cetirizine (ZYRTEC) 10 MG tablet Take 10 mg by mouth daily.   cyclobenzaprine (FLEXERIL) 10 MG tablet TAKE 1 TABLET AT BEDTIME AS NEEDED FOR MIGRAINE   escitalopram (LEXAPRO) 20 MG tablet Take 1 tablet (20 mg total) by mouth daily.   famotidine (PEPCID) 20 MG tablet Take 1 tablet (20 mg total) by mouth 2 (two) times daily.   fish oil-omega-3 fatty acids 1000 MG capsule Take 1 g by mouth daily.   Lactobacillus (ACIDOPHILUS) 100 MG CAPS Take 1 capsule by mouth daily.   levothyroxine (SYNTHROID) 25 MCG tablet TAKE 1 TABLET DAILY BEFORE BREAKFAST   Magnesium 250 MG TABS Take 1 tablet by mouth daily as needed (constipation).    Melatonin 5 MG TABS Take 5 mg by mouth at bedtime. As directed at bedtime.   metroNIDAZOLE (METROCREAM) 0.75 % cream Apply 1 Application topically 2 (two) times daily.   montelukast (SINGULAIR) 10 MG tablet Take 1 tablet (10 mg total) by mouth at bedtime.  Multiple Vitamin (MULTIVITAMIN) tablet Take 1 tablet by mouth daily.   Multiple Vitamins-Minerals (PRESERVISION AREDS PO) Take by mouth.   rosuvastatin (CRESTOR) 10 MG tablet TAKE 1 TABLET DAILY   sodium chloride HYPERTONIC 3 % nebulizer solution Take by nebulization as needed for other. (Patient not taking: Reported on 01/29/2023)   Turmeric 500 MG CAPS Take 1 tablet by mouth daily.   Vitamin D-Vitamin K (VITAMIN K2-VITAMIN  D3 PO) Take 1 capsule by mouth daily.   Facility-Administered Encounter Medications as of 01/05/2024  Medication   0.9 %  sodium chloride infusion    Allergies (verified) Adhesive [tape], Alendronate sodium, Penicillins, Varenicline tartrate, and Doxycycline   History: Past Medical History:  Diagnosis Date   Anxiety    Arthritis    left hip   Breast cancer (HCC)    Cancer (HCC) 2019   breast   Chronic headaches    Colon polyps    Depression    Family history of breast cancer    Family history of colon cancer    Family history of pancreatic cancer    Family history of thyroid cancer    GERD (gastroesophageal reflux disease)    Hypertension    elevated at times.  no meds   IBS (irritable bowel syndrome)    Insomnia    Osteopenia    Personal history of radiation therapy    2019   Tobacco abuse    Past Surgical History:  Procedure Laterality Date   ABDOMINAL HYSTERECTOMY     bleeding, fibroid, endometriosis (total)   BASAL CELL CARCINOMA EXCISION  11/22/2018   BASAL CELL CARCINOMA EXCISION  07/12/2018   BREAST LUMPECTOMY Right 01/2018   BREAST LUMPECTOMY WITH RADIOACTIVE SEED AND SENTINEL LYMPH NODE BIOPSY Right 01/31/2018   Procedure: BREAST LUMPECTOMY WITH RADIOACTIVE SEED AND SENTINEL LYMPH NODE BIOPSY;  Surgeon: Griselda Miner, MD;  Location: [facility] OR;  Service: General;  Laterality: Right;   BREAST SURGERY  2019   CATARACT EXTRACTION W/PHACO Left 02/11/2017   Procedure: CATARACT EXTRACTION PHACO AND INTRAOCULAR LENS PLACEMENT (IOC);  Surgeon: Lockie Mola, MD;  Location: [facility] ORS;  Service: Ophthalmology;  Laterality: Left;  [place] 1:02.8 AP% 13.7 CDE 8.58 FLUID PACK LOT # [id number] H   CATARACT EXTRACTION W/PHACO Right 03/10/2017   Procedure: CATARACT EXTRACTION PHACO AND INTRAOCULAR LENS PLACEMENT (IOC) Right symfony toric lens;  Surgeon: Lockie Mola, MD;  [facility]: [facility];  Service: Ophthalmology;  Laterality: Right;  symfony toric lens prefers  early   CT of chest  10/09   bronchiectasis   ENDOMETRIAL BIOPSY     LAPAROSCOPY  1984   Family History  Problem Relation Age of Onset   Cancer Mother        pancreatic, liver   Cancer Father        colon   Colon cancer Father 53   Thyroid disease Sister    Colon polyps Sister    Irritable bowel syndrome Sister    Breast cancer Sister 20   Melanoma Sister 54   Lung cancer Sister 49   Thyroid disease Sister    Thyroid disease Sister    Skin cancer Brother    Colon cancer Paternal Grandmother        dx in her 49s   Thyroid cancer Paternal Grandmother        dx in her 96s   Breast cancer Maternal Aunt        dx under 50   Glaucoma Maternal Aunt  Miscarriages / Stillbirths Cousin    Diabetes Cousin    Breast cancer Cousin 50   Heart disease Other    Esophageal cancer Neg Hx    Stomach cancer Neg Hx    Rectal cancer Neg Hx    Social History   Socioeconomic History   Marital status: Married    Spouse name: Not on file   Number of children: 1   Years of education: Not on file   Highest education level: Not on file  Occupational History   Occupation: Unemployed    Employer: UNEMPLOYED  Tobacco Use   Smoking status: Every Day    Current packs/day: 1.00    Average packs/day: 1 pack/day for 56.0 years (56.0 ttl pk-yrs)    Types: Cigarettes   Smokeless tobacco: Never   Tobacco comments:    Pt states that she smokes 1 ppd. ALS 07/14/22  Vaping Use   Vaping status: Never Used  Substance and Sexual Activity   Alcohol use: Not Currently   Drug use: No   Sexual activity: Yes  Other Topics Concern   Not on file  Social History Narrative   Daily Caffeine Use:  2 daily   Son in [place]   Sister in [place]   Social Drivers of Health   Financial Resource Strain: Low Risk  (01/05/2024)   Overall Financial Resource Strain (CARDIA)    Difficulty of Paying Living Expenses: Not hard at all  Food Insecurity: No Food Insecurity (01/05/2024)   Hunger Vital Sign    Worried  About Running Out of Food in the Last Year: Never true    Ran Out of Food in the Last Year: Never true  Transportation Needs: No Transportation Needs (01/05/2024)   PRAPARE - Administrator, Civil Service (Medical): No    Lack of Transportation (Non-Medical): No  Physical Activity: Insufficiently Active (01/05/2024)   Exercise Vital Sign    Days of Exercise per Week: 3 days    Minutes of Exercise per Session: 30 min  Stress: No Stress Concern Present (01/05/2024)   [employer] of Occupational Health - Occupational Stress Questionnaire    Feeling of Stress : Not at all  Social Connections: Moderately Isolated (01/05/2024)   Social Connection and Isolation Panel [NHANES]    Frequency of Communication with Friends and Family: More than three times a week    Frequency of Social Gatherings with Friends and Family: Once a week    Attends Religious Services: Never    Database administrator or Organizations: No    Attends Engineer, structural: Never    Marital Status: Married    Tobacco Counseling Ready to quit: Not Answered Counseling given: Not Answered Tobacco comments: Pt states that she smokes 1 ppd. ALS 07/14/22    Clinical Intake:  Pre-visit preparation completed: Yes  Pain : No/denies pain     BMI - recorded: 19.2 Nutritional Status: BMI of 19-24  Normal Nutritional Risks: None Diabetes: No  Lab Results  Component Value Date   HGBA1C 6.0 (H) 01/22/2023   HGBA1C 5.9 12/29/2021   HGBA1C 5.9 01/06/2021     How often do you need to have someone help you when you read instructions, pamphlets, or other written materials from your doctor or pharmacy?: 1 - Never  Interpreter Needed?: No  Comments: lives w/husband Information entered by :: B.Karisa Nesser,LPN   Activities of Daily Living     01/01/2024    1:54 PM 01/29/2023    2:21 PM  In your present state of health, do you have any difficulty performing the following activities:  Hearing? 0 0   Vision? 0 0  Difficulty concentrating or making decisions? 0 0  Walking or climbing stairs? 0 0  Dressing or bathing? 0 0  Doing errands, shopping? 0 0  Preparing Food and eating ? N   Using the Toilet? N   In the past six months, have you accidently leaked urine? Y   Do you have problems with loss of bowel control? N   Managing your Medications? N   Managing your Finances? N   Housekeeping or managing your Housekeeping? N     Patient Care Team: Tower, Audrie Gallus, MD as PCP - General Rollene Rotunda, MD as PCP - Cardiology (Cardiology) Lockie Mola, MD as Referring Physician (Ophthalmology) Napoleon Form, MD as Consulting Physician (Gastroenterology) Serena Croissant, MD as Consulting Physician (Hematology and Oncology) Griselda Miner, MD as Consulting Physician (General Surgery)  Indicate any recent Medical Services you may have received from other than [facility] providers in the past year (date may be approximate).     Assessment:   This is a routine wellness examination for Ameli.  Hearing/Vision screen Hearing Screening - Comments:: Says her hearing is good when not w/fluid Vision Screening - Comments:: Pt says her vision is good;readers only Dr Inez Pilgrim   Goals Addressed             This Visit's Progress    Increase physical activity   On track    01/05/24-When weather permits, I will begin walking 30 minutes 3 days per week.      Patient Stated   On track    01/05/24, I will maintain and continue medications as prescribed.      Patient Stated   On track    01/05/24-Exercise more.       Depression Screen     01/05/2024   10:33 AM 01/29/2023    2:19 PM 01/04/2023    8:25 AM 12/31/2021   11:31 AM 12/11/2019    2:51 PM 11/30/2018   11:17 AM 02/15/2018   10:43 AM  PHQ 2/9 Scores  PHQ - 2 Score 0 0 0 2 4 0 0  PHQ- 9 Score  0  2 4 0     Fall Risk     01/01/2024    1:54 PM 01/29/2023    2:21 PM 01/04/2023    8:27 AM 12/31/2021   11:24 AM 01/08/2021     2:53 PM  Fall Risk   Falls in the past year? 0 0 0 0 0  Number falls in past yr: 0 0 0 0 0  Injury with Fall? 0 0 0 0   Risk for fall due to : No Fall Risks No Fall Risks No Fall Risks No Fall Risks   Follow up Falls prevention discussed;Education provided Falls evaluation completed Falls prevention discussed;Falls evaluation completed Falls prevention discussed     MEDICARE RISK AT HOME:  Medicare Risk at Home Any stairs in or around the home?: (Patient-Rptd) Yes If so, are there any without handrails?: (Patient-Rptd) No Home free of loose throw rugs in walkways, pet beds, electrical cords, etc?: (Patient-Rptd) Yes Adequate lighting in your home to reduce risk of falls?: (Patient-Rptd) Yes Life alert?: (Patient-Rptd) No Use of a cane, walker or w/c?: (Patient-Rptd) No Grab bars in the bathroom?: (Patient-Rptd) Yes Shower chair or bench in shower?: (Patient-Rptd) No Elevated toilet seat or a handicapped toilet?: (Patient-Rptd) Yes  TIMED UP AND GO:  Was the test performed?  No  Cognitive Function: 6CIT completed    12/11/2019    2:55 PM 11/30/2018   11:17 AM 11/26/2017   10:31 AM 11/20/2016    1:01 PM  MMSE - Mini Mental State Exam  Orientation to time 5 5 5 5   Orientation to Place 5 5 5 5   Registration 3 3 3 3   Attention/ Calculation 5 0 0 0  Recall 3 3 3 3   Language- name 2 objects  0 0 0  Language- repeat 1 1 1 1   Language- follow 3 step command  3 3 3   Language- read & follow direction  0 0 0  Write a sentence  0 0 0  Copy design  0 0 0  Total score  20 20 20         01/05/2024   10:43 AM 01/04/2023    8:29 AM  6CIT Screen  What Year? 0 points 0 points  What month? 0 points 0 points  What time? 0 points 0 points  Count back from 20 0 points 0 points  Months in reverse 0 points 0 points  Repeat phrase 0 points 0 points  Total Score 0 points 0 points    Immunizations Immunization History  Administered Date(s) Administered   Influenza Split 07/27/2011,  07/12/2013   Influenza Whole 08/08/2002, 07/20/2007   Influenza, High Dose Seasonal PF 08/01/2015, 07/12/2017, 07/22/2018, 07/17/2019, 07/09/2020   Influenza, Seasonal, Injecte, Preservative Fre 08/04/2016   Influenza-Unspecified 07/13/2014, 08/14/2021   PFIZER(Purple Top)SARS-COV-2 Vaccination 01/04/2020, 01/25/2020   Pneumococcal Conjugate-13 11/19/2014   Pneumococcal Polysaccharide-23 05/01/2005, 11/01/2012   Td 03/13/1999, 07/24/2010   Zoster, Live 08/12/2014    Screening Tests Health Maintenance  Topic Date Due   Zoster Vaccines- Shingrix (1 of 2) 08/11/1966   COVID-19 Vaccine (3 - Pfizer risk series) 02/22/2020   DTaP/Tdap/Td (3 - Tdap) 07/24/2020   INFLUENZA VACCINE  05/13/2023   Lung Cancer Screening  04/11/2024   Colonoscopy  05/15/2024   MAMMOGRAM  09/01/2024   Medicare Annual Wellness (AWV)  01/04/2025   Pneumonia Vaccine 86+ Years old  Completed   DEXA SCAN  Completed   Hepatitis C Screening  Completed   HPV VACCINES  Aged Out    Health Maintenance  Health Maintenance Due  Topic Date Due   Zoster Vaccines- Shingrix (1 of 2) 08/11/1966   COVID-19 Vaccine (3 - Pfizer risk series) 02/22/2020   DTaP/Tdap/Td (3 - Tdap) 07/24/2020   INFLUENZA VACCINE  05/13/2023   Health Maintenance Items Addressed: None needed  Additional Screening:  Vision Screening: Recommended annual ophthalmology exams for early detection of glaucoma and other disorders of the eye.  Dental Screening: Recommended annual dental exams for proper oral hygiene  Community Resource Referral / Chronic Care Management: CRR required this visit?  No   CCM required this visit?  Appt scheduled with PCP     Plan:     I have personally reviewed and noted the following in the patient's chart:   Medical and social history Use of alcohol, tobacco or illicit drugs  Current medications and supplements including opioid prescriptions. Patient is not currently taking opioid prescriptions. Functional  ability and status Nutritional status Physical activity Advanced directives List of other physicians Hospitalizations, surgeries, and ER visits in previous 12 months Vitals Screenings to include cognitive, depression, and falls Referrals and appointments  In addition, I have reviewed and discussed with patient certain preventive protocols, quality metrics, and best practice recommendations.  A written personalized care plan for preventive services as well as general preventive health recommendations were provided to patient.     Sue Lush, LPN   01/18/8118   After Visit Summary: (MyChart) Due to this being a telephonic visit, the after visit summary with patients personalized plan was offered to patient via MyChart   Notes: Nothing significant to report at this time.

## 2024-01-05 NOTE — Telephone Encounter (Signed)
Future lab order  

## 2024-01-05 NOTE — Patient Instructions (Signed)
 Ms. Kuan , Thank you for taking time to come for your Medicare Wellness Visit. I appreciate your ongoing commitment to your health goals. Please review the following plan we discussed and let me know if I can assist you in the future.   Referrals/Orders/Follow-Ups/Clinician Recommendations: none  This is a list of the screening recommended for you and due dates:  Health Maintenance  Topic Date Due   Zoster (Shingles) Vaccine (1 of 2) 08[birth year]11[birth year]1966   COVID-19 Vaccine (3 - Pfizer risk series) 02/22/2020   DTaP/Tdap/Td vaccine (3 - Tdap) 07/24/2020   Flu Shot  05/13/2023   Screening for Lung Cancer  04/11/2024   Colon Cancer Screening  05/15/2024   Mammogram  09/01/2024   Medicare Annual Wellness Visit  01/04/2025   Pneumonia Vaccine  Completed   DEXA scan (bone density measurement)  Completed   Hepatitis C Screening  Completed   HPV Vaccine  Aged Out    Advanced directives: (Copy Requested) Please bring a copy of your health care power of attorney and living will to the office to be added to your chart at your convenience. You can mail to [facility] [address] [place], [place] [address] or email to [email or web address] Pt has submitted   Next Medicare Annual Wellness Visit scheduled for next year: Yes 01/05/25 @ 10:10am televisit

## 2024-01-10 ENCOUNTER — Other Ambulatory Visit: Payer: Self-pay | Admitting: Family Medicine

## 2024-01-11 ENCOUNTER — Other Ambulatory Visit: Payer: Self-pay | Admitting: Family Medicine

## 2024-01-20 DIAGNOSIS — L821 Other seborrheic keratosis: Secondary | ICD-10-CM | POA: Diagnosis not present

## 2024-01-20 DIAGNOSIS — D225 Melanocytic nevi of trunk: Secondary | ICD-10-CM | POA: Diagnosis not present

## 2024-01-20 DIAGNOSIS — M71341 Other bursal cyst, right hand: Secondary | ICD-10-CM | POA: Diagnosis not present

## 2024-01-20 DIAGNOSIS — L814 Other melanin hyperpigmentation: Secondary | ICD-10-CM | POA: Diagnosis not present

## 2024-01-20 DIAGNOSIS — L570 Actinic keratosis: Secondary | ICD-10-CM | POA: Diagnosis not present

## 2024-01-20 DIAGNOSIS — L708 Other acne: Secondary | ICD-10-CM | POA: Diagnosis not present

## 2024-01-20 DIAGNOSIS — L718 Other rosacea: Secondary | ICD-10-CM | POA: Diagnosis not present

## 2024-01-20 DIAGNOSIS — S0086XA Insect bite (nonvenomous) of other part of head, initial encounter: Secondary | ICD-10-CM | POA: Diagnosis not present

## 2024-01-25 ENCOUNTER — Other Ambulatory Visit (INDEPENDENT_AMBULATORY_CARE_PROVIDER_SITE_OTHER)

## 2024-01-25 DIAGNOSIS — E7800 Pure hypercholesterolemia, unspecified: Secondary | ICD-10-CM

## 2024-01-25 DIAGNOSIS — R7303 Prediabetes: Secondary | ICD-10-CM | POA: Diagnosis not present

## 2024-01-25 DIAGNOSIS — K219 Gastro-esophageal reflux disease without esophagitis: Secondary | ICD-10-CM

## 2024-01-25 DIAGNOSIS — R5382 Chronic fatigue, unspecified: Secondary | ICD-10-CM | POA: Diagnosis not present

## 2024-01-25 DIAGNOSIS — E039 Hypothyroidism, unspecified: Secondary | ICD-10-CM | POA: Diagnosis not present

## 2024-01-25 LAB — COMPREHENSIVE METABOLIC PANEL WITH GFR
ALT: 12 U/L (ref 0–35)
AST: 23 U/L (ref 0–37)
Albumin: 4.2 g/dL (ref 3.5–5.2)
Alkaline Phosphatase: 102 U/L (ref 39–117)
BUN: 15 mg/dL (ref 6–23)
CO2: 30 meq/L (ref 19–32)
Calcium: 9.3 mg/dL (ref 8.4–10.5)
Chloride: 102 meq/L (ref 96–112)
Creatinine, Ser: 0.77 mg/dL (ref 0.40–1.20)
GFR: 74.91 mL/min (ref >60.00)
Glucose, Bld: 101 mg/dL — ABNORMAL HIGH (ref 70–99)
Potassium: 3.9 meq/L (ref 3.5–5.1)
Sodium: 139 meq/L (ref 135–145)
Total Bilirubin: 0.5 mg/dL (ref 0.2–1.2)
Total Protein: 6.6 g/dL (ref 6.0–8.3)

## 2024-01-25 LAB — CBC WITH DIFFERENTIAL/PLATELET
Basophils Absolute: 0.1 10*3/uL (ref 0.0–0.1)
Basophils Relative: 0.8 % (ref 0.0–3.0)
Eosinophils Absolute: 0.2 10*3/uL (ref 0.0–0.7)
Eosinophils Relative: 3.5 % (ref 0.0–5.0)
HCT: 43.1 % (ref 36.0–46.0)
Hemoglobin: 14.6 g/dL (ref 12.0–15.0)
Lymphocytes Relative: 27.7 % (ref 12.0–46.0)
Lymphs Abs: 2 10*3/uL (ref 0.7–4.0)
MCHC: 33.8 g/dL (ref 30.0–36.0)
MCV: 97 fl (ref 78.0–100.0)
Monocytes Absolute: 0.8 10*3/uL (ref 0.1–1.0)
Monocytes Relative: 11 % (ref 3.0–12.0)
Neutro Abs: 4.1 10*3/uL (ref 1.4–7.7)
Neutrophils Relative %: 57 % (ref 43.0–77.0)
Platelets: 241 10*3/uL (ref 150.0–400.0)
RBC: 4.45 Mil/uL (ref 3.87–5.11)
RDW: 13.3 % (ref 11.5–15.5)
WBC: 7.2 10*3/uL (ref 4.0–10.5)

## 2024-01-25 LAB — LIPID PANEL
Cholesterol: 140 mg/dL (ref 0–200)
HDL: 70.5 mg/dL (ref >39.00)
LDL Cholesterol: 52 mg/dL (ref 0–99)
NonHDL: 69.16
Total CHOL/HDL Ratio: 2
Triglycerides: 88 mg/dL (ref 0.0–149.0)
VLDL: 17.6 mg/dL (ref 0.0–40.0)

## 2024-01-25 LAB — HEMOGLOBIN A1C: Hgb A1c MFr Bld: 6.1 % (ref 4.6–6.5)

## 2024-01-25 LAB — TSH: TSH: 3.48 u[IU]/mL (ref 0.35–5.50)

## 2024-02-01 ENCOUNTER — Encounter: Payer: Self-pay | Admitting: Family Medicine

## 2024-02-01 ENCOUNTER — Other Ambulatory Visit: Payer: Self-pay | Admitting: Family Medicine

## 2024-02-01 ENCOUNTER — Ambulatory Visit (INDEPENDENT_AMBULATORY_CARE_PROVIDER_SITE_OTHER): Admitting: Family Medicine

## 2024-02-01 VITALS — BP 130/68 | HR 79 | Temp 97.9°F | Ht 61.5 in | Wt 103.0 lb

## 2024-02-01 DIAGNOSIS — Z1283 Encounter for screening for malignant neoplasm of skin: Secondary | ICD-10-CM

## 2024-02-01 DIAGNOSIS — K219 Gastro-esophageal reflux disease without esophagitis: Secondary | ICD-10-CM | POA: Diagnosis not present

## 2024-02-01 DIAGNOSIS — R7303 Prediabetes: Secondary | ICD-10-CM

## 2024-02-01 DIAGNOSIS — E7800 Pure hypercholesterolemia, unspecified: Secondary | ICD-10-CM | POA: Diagnosis not present

## 2024-02-01 DIAGNOSIS — R5382 Chronic fatigue, unspecified: Secondary | ICD-10-CM | POA: Diagnosis not present

## 2024-02-01 DIAGNOSIS — F17200 Nicotine dependence, unspecified, uncomplicated: Secondary | ICD-10-CM | POA: Diagnosis not present

## 2024-02-01 DIAGNOSIS — Z1211 Encounter for screening for malignant neoplasm of colon: Secondary | ICD-10-CM

## 2024-02-01 DIAGNOSIS — F418 Other specified anxiety disorders: Secondary | ICD-10-CM | POA: Diagnosis not present

## 2024-02-01 DIAGNOSIS — J301 Allergic rhinitis due to pollen: Secondary | ICD-10-CM

## 2024-02-01 DIAGNOSIS — M8589 Other specified disorders of bone density and structure, multiple sites: Secondary | ICD-10-CM

## 2024-02-01 DIAGNOSIS — E039 Hypothyroidism, unspecified: Secondary | ICD-10-CM | POA: Diagnosis not present

## 2024-02-01 MED ORDER — MONTELUKAST SODIUM 10 MG PO TABS: 10.0000 mg | ORAL_TABLET | Freq: Every day | ORAL | 3 refills | Status: AC

## 2024-02-01 MED ORDER — LEVOTHYROXINE SODIUM 25 MCG PO TABS: 25.0000 ug | ORAL_TABLET | Freq: Every day | ORAL | 3 refills | Status: AC

## 2024-02-01 MED ORDER — ESCITALOPRAM OXALATE 20 MG PO TABS: 20.0000 mg | ORAL_TABLET | Freq: Every day | ORAL | 3 refills | Status: AC

## 2024-02-01 NOTE — Assessment & Plan Note (Signed)
 Last colonoscopy normal 05/2021 - no recall due to age

## 2024-02-01 NOTE — Assessment & Plan Note (Signed)
 Mood is stalble PHQ score of 0 Continues lexapro  which she wants to continue 20 mg daily   Encouraged good self care

## 2024-02-01 NOTE — Patient Instructions (Addendum)
 You are due for a tetanus shot at the pharmacy   If you are interested in the new shingles vaccine (Shingrix) - call your local pharmacy to check on coverage and availability   I think your CT of the chest will be due in the late summer   Keep walking Add some strength training to your routine, this is important for bone and brain health and can reduce your risk of falls and help your body use insulin properly and regulate weight  Light weights, exercise bands , and internet videos are a good way to start  Yoga (chair or regular), machines , floor exercises or a gym with machines are also good options   A weighted vest is a popular option as well for walking   Make sure you get protein with every meal The following are examples of protein in diet  Meat  Fish  Eggs  Dairy products  Soy products  Oat milk  Almond milk Nuts and nut butters Legumes   Dried beans

## 2024-02-01 NOTE — Assessment & Plan Note (Signed)
 Takes prn pepcid   Encouraged to avoid triggers

## 2024-02-01 NOTE — Assessment & Plan Note (Signed)
 Disc goals for lipids and reasons to control them Rev last labs with pt Rev low sat fat diet in detail   LDL of 52 Plan ot continue crestor  10 mg daily

## 2024-02-01 NOTE — Assessment & Plan Note (Signed)
 Utd derm visits and treatment Discussed sun protection

## 2024-02-01 NOTE — Assessment & Plan Note (Signed)
 Dexa 12/2022  No falls or fracture No longer on tamoxifen  (finished a year ago)  Discussed fall prevention, supplements and exercise for bone density  Encouraged more strength building exercise, discussed options

## 2024-02-01 NOTE — Assessment & Plan Note (Signed)
 Lab Results  Component Value Date   HGBA1C 6.1 01/25/2024   HGBA1C 6.0 (H) 01/22/2023   HGBA1C 5.9 12/29/2021   disc imp of low glycemic diet and wt loss to prevent DM2  Stressed importance of protein to prevent weight loss

## 2024-02-01 NOTE — Progress Notes (Signed)
 Subjective:    Patient ID: Cheryl Aguirre, female    DOB: [birth year], 78 y.o.   MRN: [id number]  HPI  Pt presents for annual visit to review chronic medical problems   Wt Readings from Last 3 Encounters:  02/01/24 103 lb (46.7 kg)  01/05/24 105 lb (47.6 kg)  01/29/23 105 lb (47.6 kg)   19.15 kg/m  Vitals:   02/01/24 1054  BP: 130/68  Pulse: 79  Temp: 97.9 F (36.6 C)  SpO2: 96%    Immunization History  Administered Date(s) Administered   Influenza Split 07/27/2011, 07/12/2013   Influenza Whole 08/08/2002, 07/20/2007   Influenza, High Dose Seasonal PF 08/01/2015, 07/12/2017, 07/22/2018, 07/17/2019, 07/09/2020   Influenza, Seasonal, Injecte, Preservative Fre 08/04/2016   Influenza-Unspecified 07/13/2014, 08/14/2021   PFIZER(Purple Top)SARS-COV-2 Vaccination 01/04/2020, 01/25/2020   Pneumococcal Conjugate-13 11/19/2014   Pneumococcal Polysaccharide-23 05/01/2005, 11/01/2012   Td 03/13/1999, 07/24/2010   Zoster, Live 08/12/2014    There are no preventive care reminders to display for this patient.   Doing ok  Very busy  Allergies have been bad    Tetanus shot 2011   Mammogram 08/2023  Personal history of breast cancer  Tamoxifen  in past -completed 2024  Self breast exam- no new lumps or changes (she has chronic pain and firmness in area of old incision)   Gyn health No problems    Smoking status   About 1ppd  Thinks about quitting and not ready    Colon cancer screening  colonoscopy 05/2021 with no recall   CT chest/lung cancer screen 04/2023   Bone health  Dexa 12/2022  osteopenia  Falls-none  Fractures-none  Supplements ca and D  Last vitamin D  Lab Results  Component Value Date   VD25OH 56.85 11/12/2014    Exercise  Walks a lot -outdoors    Dermatology care  Just had an appointment  Has had skin cancers removed  Using sunscreen     Mood    01/05/2024   10:33 AM 01/29/2023    2:19 PM 01/04/2023    8:25 AM 12/31/2021    11:31 AM 12/11/2019    2:51 PM  Depression screen PHQ 2/9  Decreased Interest 0 0 0 1 2  Down, Depressed, Hopeless 0 0 0 1 2  PHQ - 2 Score 0 0 0 2 4  Altered sleeping  0  0 0  Tired, decreased energy  0  0 0  Change in appetite  0  0 0  Feeling bad or failure about yourself   0  0 0  Trouble concentrating  0  0 0  Moving slowly or fidgety/restless  0  0 0  Suicidal thoughts  0  0 0  PHQ-9 Score  0  2 4  Difficult doing work/chores  Not difficult at all  Somewhat difficult Not difficult at all   Lexapro  20 mg daily   History of fatigue   GERD Pepcid  if needed    Hypothyroidism  Pt has no clinical changes No change in energy level/ hair or skin/ edema and no tremor Lab Results  Component Value Date   TSH 3.48 01/25/2024    Levothyroxine  25 mcg   Prediabetes Lab Results  Component Value Date   HGBA1C 6.1 01/25/2024   HGBA1C 6.0 (H) 01/22/2023   HGBA1C 5.9 12/29/2021   Tries hard to cut back on sugar    Hyperlipidemia Lab Results  Component Value Date   CHOL 140 01/25/2024   CHOL 137 01/22/2023  CHOL 167 12/29/2021   Lab Results  Component Value Date   HDL 70.50 01/25/2024   HDL 70 01/22/2023   HDL 73.00 12/29/2021   Lab Results  Component Value Date   LDLCALC 52 01/25/2024   LDLCALC 52 01/22/2023   LDLCALC 79 12/29/2021   Lab Results  Component Value Date   TRIG 88.0 01/25/2024   TRIG 70 01/22/2023   TRIG 76.0 12/29/2021   Lab Results  Component Value Date   CHOLHDL 2 01/25/2024   CHOLHDL 2.0 01/22/2023   CHOLHDL 2 12/29/2021   Lab Results  Component Value Date   LDLDIRECT 138.2 11/01/2013   LDLDIRECT 109.6 10/26/2012      Patient Active Problem List   Diagnosis Date Noted   Coronary artery calcification 07/15/2022   Skin cancer screening 01/02/2022   Fatigue 12/28/2021   Dysphagia 01/08/2021   Allergic rhinitis 01/08/2021   Flank pain 01/08/2021   Hypothyroid 12/29/2019   Genetic testing 03/30/2018   Family history of breast  cancer    Family history of thyroid  cancer    Family history of pancreatic cancer    Malignant neoplasm of lower-inner quadrant of right breast of female, estrogen receptor positive (HCC) 02/08/2018   Estrogen deficiency 11/23/2016   Prediabetes 11/14/2016   Screening for thyroid  disorder 11/14/2016   Family history of colon cancer 11/22/2015   Colon cancer screening 11/22/2015   Encounter for Medicare annual wellness exam 10/31/2013   Headache 11/01/2012   Gynecologic exam normal 09/15/2011   Routine general medical examination at a health care facility 09/07/2011   ONYCHOMYCOSIS 07/24/2010   Pure hypercholesterolemia 07/21/2010   History of colonic polyps 02/18/2010   Nonspecific (abnormal) findings on radiological and other examination of body structure 08/02/2008   NONSPCIFC ABN FINDING RAD & OTH EXAM LUNG FIELD 08/02/2008   Osteopenia 07/20/2007   TOBACCO ABUSE 02/11/2007   Depression with anxiety 02/11/2007   GERD 02/11/2007   Fibrocystic breast changes 02/11/2007   Past Medical History:  Diagnosis Date   Anxiety    Arthritis    left hip   Breast cancer (HCC)    Cancer (HCC) 2019   breast   Chronic headaches    Colon polyps    Depression    Family history of breast cancer    Family history of colon cancer    Family history of pancreatic cancer    Family history of thyroid  cancer    GERD (gastroesophageal reflux disease)    Hypertension    elevated at times.  no meds   IBS (irritable bowel syndrome)    Insomnia    Osteopenia    Personal history of radiation therapy    2019   Tobacco abuse    Past Surgical History:  Procedure Laterality Date   ABDOMINAL HYSTERECTOMY     bleeding, fibroid, endometriosis (total)   BASAL CELL CARCINOMA EXCISION  11/22/2018   BASAL CELL CARCINOMA EXCISION  07/12/2018   BREAST LUMPECTOMY Right 01/2018   BREAST LUMPECTOMY WITH RADIOACTIVE SEED AND SENTINEL LYMPH NODE BIOPSY Right 01/31/2018   Procedure: BREAST LUMPECTOMY WITH  RADIOACTIVE SEED AND SENTINEL LYMPH NODE BIOPSY;  Surgeon: Caralyn Chandler, MD;  Location: [facility] OR;  Service: General;  Laterality: Right;   BREAST SURGERY  2019   CATARACT EXTRACTION W/PHACO Left 02/11/2017   Procedure: CATARACT EXTRACTION PHACO AND INTRAOCULAR LENS PLACEMENT (IOC);  Surgeon: Annell Kidney, MD;  Location: [facility];  Service: Ophthalmology;  Laterality: Left;  US  1:02.8 AP% 13.7 CDE 8.58 FLUID  PACK LOT # [id number]   CATARACT EXTRACTION W/PHACO Right 03/10/2017   Procedure: CATARACT EXTRACTION PHACO AND INTRAOCULAR LENS PLACEMENT (IOC) Right symfony toric lens;  Surgeon: Annell Kidney, MD;  Location: [facility];  Service: Ophthalmology;  Laterality: Right;  symfony toric lens prefers early   CT of chest  10/09   bronchiectasis   ENDOMETRIAL BIOPSY     LAPAROSCOPY  1984   Social History   Tobacco Use   Smoking status: Every Day    Current packs/day: 1.00    Average packs/day: 1 pack/day for 56.0 years (56.0 ttl pk-yrs)    Types: Cigarettes   Smokeless tobacco: Never   Tobacco comments:    Pt states that she smokes 1 ppd. ALS 07/14/22  Vaping Use   Vaping status: Never Used  Substance Use Topics   Alcohol use: Not Currently   Drug use: No   Family History  Problem Relation Age of Onset   Cancer Mother        pancreatic, liver   Cancer Father        colon   Colon cancer Father 42   Thyroid  disease Sister    Colon polyps Sister    Irritable bowel syndrome Sister    Breast cancer Sister 94   Melanoma Sister 72   Lung cancer Sister 51   Thyroid  disease Sister    Thyroid  disease Sister    Skin cancer Brother    Colon cancer Paternal Grandmother        dx in her 68s   Thyroid  cancer Paternal Grandmother        dx in her 23s   Breast cancer Maternal Aunt        dx under 50   Glaucoma Maternal Aunt    Miscarriages / Stillbirths Cousin    Diabetes Cousin    Breast cancer Cousin 50   Heart disease Other    Esophageal cancer Neg Hx     Stomach cancer Neg Hx    Rectal cancer Neg Hx    Allergies  Allergen Reactions   Adhesive [Tape]     Tears skin   Alendronate Sodium     REACTION: muscle, joint pain, indigestion   Penicillins Other (See Comments)    Has patient had a PCN reaction causing immediate rash, facial/tongue/throat swelling, SOB or lightheadedness with hypotension: yes Has patient had a PCN reaction causing severe rash involving mucus membranes or skin necrosis: no Has patient had a PCN reaction that required hospitalization: no Has patient had a PCN reaction occurring within the last 10 years: no If all of the above answers are "NO", then may proceed with Cephalosporin use. **CHILDHOOD**   Varenicline Tartrate Nausea Only        Doxycycline Nausea And Vomiting and Rash        Current Outpatient Medications on File Prior to Visit  Medication Sig Dispense Refill   acetaminophen  (TYLENOL ) 500 MG tablet Take 500 mg by mouth every 8 (eight) hours as needed.     Azelaic Acid 15 % gel Apply 1 Application topically daily.     calcium  carbonate (TUMS - DOSED IN MG ELEMENTAL CALCIUM ) 500 MG chewable tablet Chew 1 tablet by mouth as needed for indigestion or heartburn.     cetirizine (ZYRTEC) 10 MG tablet Take 10 mg by mouth daily.     cyclobenzaprine  (FLEXERIL ) 10 MG tablet TAKE 1 TABLET AT BEDTIME AS NEEDED FOR MIGRAINE 90 tablet 1   famotidine  (PEPCID ) 20 MG tablet  Take 1 tablet (20 mg total) by mouth 2 (two) times daily. 180 tablet 3   fish oil-omega-3 fatty acids 1000 MG capsule Take 1 g by mouth daily.     Lactobacillus (ACIDOPHILUS) 100 MG CAPS Take 1 capsule by mouth daily.     Magnesium 250 MG TABS Take 1 tablet by mouth daily as needed (constipation).      Melatonin 5 MG TABS Take 5 mg by mouth at bedtime. As directed at bedtime.     Multiple Vitamin (MULTIVITAMIN) tablet Take 1 tablet by mouth daily.     Multiple Vitamins-Minerals (PRESERVISION AREDS PO) Take by mouth.     rosuvastatin  (CRESTOR ) 10 MG  tablet TAKE 1 TABLET DAILY 90 tablet 3   Turmeric 500 MG CAPS Take 1 tablet by mouth daily.     Vitamin D -Vitamin K (VITAMIN K2-VITAMIN D3 PO) Take 1 capsule by mouth daily.     No current facility-administered medications on file prior to visit.    Review of Systems  Constitutional:  Positive for fatigue. Negative for activity change, appetite change, fever and unexpected weight change.       Intermittent fatigue   HENT:  Positive for postnasal drip, rhinorrhea and sneezing. Negative for congestion, ear pain, sinus pressure and sore throat.   Eyes:  Negative for pain, redness and visual disturbance.  Respiratory:  Negative for cough, shortness of breath and wheezing.   Cardiovascular:  Negative for chest pain and palpitations.  Gastrointestinal:  Negative for abdominal pain, blood in stool, constipation and diarrhea.  Endocrine: Negative for polydipsia and polyuria.  Genitourinary:  Negative for dysuria, frequency and urgency.  Musculoskeletal:  Negative for arthralgias, back pain and myalgias.  Skin:  Negative for pallor and rash.  Allergic/Immunologic: Negative for environmental allergies.  Neurological:  Negative for dizziness, syncope and headaches.  Hematological:  Negative for adenopathy. Does not bruise/bleed easily.  Psychiatric/Behavioral:  Negative for decreased concentration and dysphoric mood. The patient is not nervous/anxious.        Objective:   Physical Exam Constitutional:      General: She is not in acute distress.    Appearance: Normal appearance. She is well-developed and normal weight. She is not ill-appearing or diaphoretic.     Comments: Slim   HENT:     Head: Normocephalic and atraumatic.     Right Ear: Tympanic membrane, ear canal and external ear normal.     Left Ear: Tympanic membrane, ear canal and external ear normal.     Nose: Nose normal. No congestion.     Comments: Boggy nares     Mouth/Throat:     Mouth: Mucous membranes are moist.      Pharynx: Oropharynx is clear. No posterior oropharyngeal erythema.  Eyes:     General: No scleral icterus.    Extraocular Movements: Extraocular movements intact.     Conjunctiva/sclera: Conjunctivae normal.     Pupils: Pupils are equal, round, and reactive to light.  Neck:     Thyroid : No thyromegaly.     Vascular: No carotid bruit or JVD.  Cardiovascular:     Rate and Rhythm: Normal rate and regular rhythm.     Pulses: Normal pulses.     Heart sounds: Normal heart sounds.     No gallop.  Pulmonary:     Effort: Pulmonary effort is normal. No respiratory distress.     Breath sounds: Normal breath sounds. No wheezing.     Comments: Good air exch Chest:  Chest wall: No tenderness.  Abdominal:     General: Bowel sounds are normal. There is no distension or abdominal bruit.     Palpations: Abdomen is soft. There is no mass.     Tenderness: There is no abdominal tenderness.     Hernia: No hernia is present.  Genitourinary:    Comments: Breast exam: No mass, nodules, thickening, tenderness, bulging, retraction, inflamation, nipple discharge or skin changes noted.  No axillary or clavicular LA.     Baseline scar tissue medial to scar on right breast with sensitivity  Musculoskeletal:        General: No tenderness. Normal range of motion.     Cervical back: Normal range of motion and neck supple. No rigidity. No muscular tenderness.     Right lower leg: No edema.     Left lower leg: No edema.     Comments: No kyphosis   Lymphadenopathy:     Cervical: No cervical adenopathy.  Skin:    General: Skin is warm and dry.     Coloration: Skin is not pale.     Findings: No erythema or rash.     Comments: Solar lentigines diffusely Some sks  Neurological:     Mental Status: She is alert. Mental status is at baseline.     Cranial Nerves: No cranial nerve deficit.     Motor: No abnormal muscle tone.     Coordination: Coordination normal.     Gait: Gait normal.     Deep Tendon Reflexes:  Reflexes are normal and symmetric. Reflexes normal.  Psychiatric:        Mood and Affect: Mood normal.        Cognition and Memory: Cognition and memory normal.           Assessment & Plan:   Problem List Items Addressed This Visit       Respiratory   Allergic rhinitis   Continues singulair  and antihistamine prn  Encouraged pollen avoidance in season when able         Digestive   GERD   Takes prn pepcid   Encouraged to avoid triggers         Endocrine   Hypothyroid - Primary   Hypothyroidism  Pt has no clinical changes No change in energy level/ hair or skin/ edema and no tremor Lab Results  Component Value Date   TSH 3.48 01/25/2024    Continues levothyroxine  25 mcg daily        Relevant Medications   levothyroxine  (SYNTHROID ) 25 MCG tablet     Musculoskeletal and Integument   Osteopenia   Dexa 12/2022  No falls or fracture No longer on tamoxifen  (finished a year ago)  Discussed fall prevention, supplements and exercise for bone density  Encouraged more strength building exercise, discussed options         Other   TOBACCO ABUSE   Disc in detail risks of smoking and possible outcomes including copd, vascular/ heart disease, cancer , respiratory and sinus infections as well as osteoporosis  Pt voices understanding  She is not ready to quit Continues lung cancer screening with serial CT scans/ due this summer       Skin cancer screening   Utd derm visits and treatment Discussed sun protection       Pure hypercholesterolemia   Disc goals for lipids and reasons to control them Rev last labs with pt Rev low sat fat diet in detail   LDL of 52 Plan ot continue  crestor  10 mg daily       Prediabetes   Lab Results  Component Value Date   HGBA1C 6.1 01/25/2024   HGBA1C 6.0 (H) 01/22/2023   HGBA1C 5.9 12/29/2021   disc imp of low glycemic diet and wt loss to prevent DM2  Stressed importance of protein to prevent weight loss       Fatigue    Stable Labs reviewed  Encouraged good nutrition with protein intake       Depression with anxiety   Mood is stalble PHQ score of 0 Continues lexapro  which she wants to continue 20 mg daily   Encouraged good self care       Relevant Medications   escitalopram  (LEXAPRO ) 20 MG tablet   Colon cancer screening   Last colonoscopy normal 05/2021 - no recall due to age

## 2024-02-01 NOTE — Assessment & Plan Note (Signed)
 Continues singulair  and antihistamine prn  Encouraged pollen avoidance in season when able

## 2024-02-01 NOTE — Assessment & Plan Note (Signed)
 Hypothyroidism  Pt has no clinical changes No change in energy level/ hair or skin/ edema and no tremor Lab Results  Component Value Date   TSH 3.48 01/25/2024    Continues levothyroxine  25 mcg daily

## 2024-02-01 NOTE — Assessment & Plan Note (Signed)
 Disc in detail risks of smoking and possible outcomes including copd, vascular/ heart disease, cancer , respiratory and sinus infections as well as osteoporosis  Pt voices understanding  She is not ready to quit Continues lung cancer screening with serial CT scans/ due this summer

## 2024-02-01 NOTE — Assessment & Plan Note (Signed)
 Stable Labs reviewed  Encouraged good nutrition with protein intake

## 2024-04-04 DIAGNOSIS — H35372 Puckering of macula, left eye: Secondary | ICD-10-CM | POA: Diagnosis not present

## 2024-04-04 DIAGNOSIS — G514 Facial myokymia: Secondary | ICD-10-CM | POA: Diagnosis not present

## 2024-04-04 DIAGNOSIS — H04123 Dry eye syndrome of bilateral lacrimal glands: Secondary | ICD-10-CM | POA: Diagnosis not present

## 2024-04-04 DIAGNOSIS — Z961 Presence of intraocular lens: Secondary | ICD-10-CM | POA: Diagnosis not present

## 2024-04-12 ENCOUNTER — Inpatient Hospital Stay
Admission: RE | Admit: 2024-04-12 | Discharge: 2024-04-12 | Disposition: A | Discharge status: Home / Self Care | Source: Ambulatory Visit | Attending: Acute Care | Admitting: Acute Care

## 2024-04-12 DIAGNOSIS — F17210 Nicotine dependence, cigarettes, uncomplicated: Secondary | ICD-10-CM | POA: Diagnosis not present

## 2024-04-12 DIAGNOSIS — Z122 Encounter for screening for malignant neoplasm of respiratory organs: Secondary | ICD-10-CM | POA: Diagnosis not present

## 2024-04-12 DIAGNOSIS — Z87891 Personal history of nicotine dependence: Secondary | ICD-10-CM

## 2024-04-24 ENCOUNTER — Other Ambulatory Visit: Payer: Self-pay

## 2024-04-24 DIAGNOSIS — Z87891 Personal history of nicotine dependence: Secondary | ICD-10-CM

## 2024-04-24 DIAGNOSIS — Z122 Encounter for screening for malignant neoplasm of respiratory organs: Secondary | ICD-10-CM

## 2024-04-24 DIAGNOSIS — F17210 Nicotine dependence, cigarettes, uncomplicated: Secondary | ICD-10-CM

## 2024-05-29 ENCOUNTER — Other Ambulatory Visit: Payer: Self-pay | Admitting: Cardiology

## 2024-06-30 ENCOUNTER — Other Ambulatory Visit: Payer: Self-pay | Admitting: Family Medicine

## 2024-06-30 DIAGNOSIS — M5431 Sciatica, right side: Secondary | ICD-10-CM

## 2024-06-30 NOTE — Telephone Encounter (Signed)
 Last filled on 06/01/23 #90 tab/ 1 refill  CPE was on 02/01/24

## 2024-07-24 ENCOUNTER — Ambulatory Visit: Payer: Self-pay

## 2024-07-24 NOTE — Telephone Encounter (Signed)
 FYI Only or Action Required?: FYI only for provider.  Patient was last seen in primary care on 02/01/2024 by Randeen Laine LABOR, MD.  Called Nurse Triage reporting Cough.  Symptoms began several weeks ago.  Interventions attempted: OTC medications: cough syrup.  Symptoms are: gradually worsening.  Triage Disposition: See Physician Within 24 Hours  Patient/caregiver understands and will follow disposition?: Yes          Reason for Disposition  SEVERE coughing spells (e.g., whooping sound after coughing, vomiting after coughing)  Answer Assessment - Initial Assessment Questions 1. ONSET: When did the cough begin?      weeks 2. SEVERITY: How bad is the cough today?      Has gotten worse 3. SPUTUM: Describe the color of your sputum (e.g., none, dry cough; clear, white, yellow, green)     Green/yellow 4. HEMOPTYSIS: Are you coughing up any blood? If Yes, ask: How much? (e.g., flecks, streaks, tablespoons, etc.)     denies 5. DIFFICULTY BREATHING: Are you having difficulty breathing? If Yes, ask: How bad is it? (e.g., mild, moderate, severe)      Denies - coughing fits that wakes up at night 6. FEVER: Do you have a fever? If Yes, ask: What is your temperature, how was it measured, and when did it start?     Intermittent tactile low grade fevers  7. CARDIAC HISTORY: Do you have any history of heart disease? (e.g., heart attack, congestive heart failure)      *No Answer* 8. LUNG HISTORY: Do you have any history of lung disease?  (e.g., pulmonary embolus, asthma, emphysema)     *No Answer* 9. PE RISK FACTORS: Do you have a history of blood clots? (or: recent major surgery, recent prolonged travel, bedridden)     *No Answer* 10. OTHER SYMPTOMS: Do you have any other symptoms? (e.g., runny nose, wheezing, chest pain)       denies 11. PREGNANCY: Is there any chance you are pregnant? When was your last menstrual period?       N/a 12. TRAVEL: Have you  traveled out of the country in the last month? (e.g., travel history, exposures)       N/a  Protocols used: Cough - Acute Productive-A-AH

## 2024-07-25 ENCOUNTER — Encounter: Payer: Self-pay | Admitting: Family Medicine

## 2024-07-25 ENCOUNTER — Ambulatory Visit (INDEPENDENT_AMBULATORY_CARE_PROVIDER_SITE_OTHER): Admitting: Family Medicine

## 2024-07-25 VITALS — BP 126/62 | HR 77 | Temp 99.3°F | Ht 61.5 in | Wt 100.4 lb

## 2024-07-25 DIAGNOSIS — R051 Acute cough: Secondary | ICD-10-CM | POA: Diagnosis not present

## 2024-07-25 DIAGNOSIS — F17200 Nicotine dependence, unspecified, uncomplicated: Secondary | ICD-10-CM

## 2024-07-25 MED ORDER — AZITHROMYCIN 250 MG PO TABS: ORAL_TABLET | ORAL | 0 refills | Status: AC

## 2024-07-25 MED ORDER — GUAIFENESIN-CODEINE 100-10 MG/5ML PO SOLN: 5.0000 mL | Freq: Four times a day (QID) | ORAL | 0 refills | Status: AC | PRN

## 2024-07-25 NOTE — Assessment & Plan Note (Signed)
 Encourage smoking cessation

## 2024-07-25 NOTE — Telephone Encounter (Signed)
 Appointment with Dr. Avelina 07/25/2024 at 4:00 pm.

## 2024-07-25 NOTE — Assessment & Plan Note (Signed)
 Acute, symptoms ongoing greater than 7 to 10 days, concerning for allergies initially now with possible bacterial superinfection.  Patient with low-grade temperature cough productive of purulent sputum in a patient with COPD/bronchiectasis. Will start with initial antibiotic treatment with azithromycin .  Can use cough suppressant at night, continue Delsym during the day.  Given her history of bronchiectasis if she is not improving as expected I encouraged her to follow-up with pulmonary Dr. Annella.  Return and ER precautions provided

## 2024-07-25 NOTE — Progress Notes (Signed)
 Patient ID: Cheryl Aguirre, female    DOB: 11-19-1945, 78 y.o.   MRN: [id number]  This visit was conducted in person.  BP 126/62   Pulse 77   Temp 99.3 F (37.4 C) (Temporal)   Ht 5' 1.5 (1.562 m)   Wt 100 lb 6 oz (45.5 kg)   SpO2 95%   BMI 18.66 kg/m    CC:  Chief Complaint  Patient presents with   Cough    With yellow/greenish phelgm Also having cough fits   Nasal Congestion        Watery Eyes    Subjective:   HPI: Cheryl Aguirre is a 78 y.o. female presenting on 07/25/2024 for Cough (With yellow/greenish phelgm/Also having cough fits), Nasal Congestion (/), and Watery Eyes   Date of onset:  2-3  weeks Initial symptoms included  started as allergy symptoms, nasal congestion, watery eyes Symptoms progressed to productive cough.. yellow green mucus.   Bilateral  ear pain, no face pain.    Low grade temp 30F, chills in last week  No current wheeze or SOB.  Coughing waking her up at night  Sick contacts:  none COVID testing:   none     She has tried to treat with  tylenol , Delsym.    Has history of  COPD, bronchiectasis, upon review of old pulmonary note.  Appears she was diagnosed with bronchiectasis and had a sputum culture positive for Klebsiella and Pseudomonas.  Was treated with Levaquin  at that time. Smoker.      Relevant past medical, surgical, family and social history reviewed and updated as indicated. Interim medical history since our last visit reviewed. Allergies and medications reviewed and updated. Outpatient Medications Prior to Visit  Medication Sig Dispense Refill   acetaminophen  (TYLENOL ) 500 MG tablet Take 500 mg by mouth every 8 (eight) hours as needed.     Azelaic Acid 15 % gel Apply 1 Application topically daily.     calcium  carbonate (TUMS - DOSED IN MG ELEMENTAL CALCIUM ) 500 MG chewable tablet Chew 1 tablet by mouth as needed for indigestion or heartburn.     cetirizine (ZYRTEC) 10 MG tablet Take 10 mg by mouth daily.      cyclobenzaprine  (FLEXERIL ) 10 MG tablet TAKE 1 TABLET AT BEDTIME AS NEEDED FOR MIGRAINE 90 tablet 1   escitalopram  (LEXAPRO ) 20 MG tablet Take 1 tablet (20 mg total) by mouth daily. 90 tablet 3   famotidine  (PEPCID ) 20 MG tablet Take 1 tablet (20 mg total) by mouth 2 (two) times daily. 180 tablet 3   fish oil-omega-3 fatty acids 1000 MG capsule Take 1 g by mouth daily.     Lactobacillus (ACIDOPHILUS) 100 MG CAPS Take 1 capsule by mouth daily.     levothyroxine  (SYNTHROID ) 25 MCG tablet Take 1 tablet (25 mcg total) by mouth daily before breakfast. 90 tablet 3   Magnesium 250 MG TABS Take 1 tablet by mouth daily as needed (constipation).      Melatonin 5 MG TABS Take 5 mg by mouth at bedtime. As directed at bedtime.     montelukast  (SINGULAIR ) 10 MG tablet Take 1 tablet (10 mg total) by mouth at bedtime. 90 tablet 3   Multiple Vitamin (MULTIVITAMIN) tablet Take 1 tablet by mouth daily.     Multiple Vitamins-Minerals (PRESERVISION AREDS PO) Take by mouth.     rosuvastatin  (CRESTOR ) 10 MG tablet TAKE 1 TABLET DAILY 90 tablet 3   Turmeric 500 MG CAPS Take 1 tablet  by mouth daily.     Vitamin D -Vitamin K (VITAMIN K2-VITAMIN D3 PO) Take 1 capsule by mouth daily.     No facility-administered medications prior to visit.     Per HPI unless specifically indicated in ROS section below Review of Systems  Constitutional:  Negative for fatigue and fever.  HENT:  Negative for congestion.   Eyes:  Negative for pain.  Respiratory:  Negative for cough and shortness of breath.   Cardiovascular:  Negative for chest pain, palpitations and leg swelling.  Gastrointestinal:  Negative for abdominal pain.  Genitourinary:  Negative for dysuria and vaginal bleeding.  Musculoskeletal:  Negative for back pain.  Neurological:  Negative for syncope, light-headedness and headaches.  Psychiatric/Behavioral:  Negative for dysphoric mood.    Objective:  BP 126/62   Pulse 77   Temp 99.3 F (37.4 C) (Temporal)   Ht  5' 1.5 (1.562 m)   Wt 100 lb 6 oz (45.5 kg)   SpO2 95%   BMI 18.66 kg/m   Wt Readings from Last 3 Encounters:  07/25/24 100 lb 6 oz (45.5 kg)  02/01/24 103 lb (46.7 kg)  01/05/24 105 lb (47.6 kg)      Physical Exam    Results for orders placed or performed in visit on 01/25/24  Hemoglobin A1c   Collection Time: 01/25/24 10:29 AM  Result Value Ref Range   Hgb A1c MFr Bld 6.1 4.6 - 6.5 %  CBC with Differential/Platelet   Collection Time: 01/25/24 10:29 AM  Result Value Ref Range   WBC 7.2 4.0 - 10.5 K/uL   RBC 4.45 3.87 - 5.11 Mil/uL   Hemoglobin 14.6 12.0 - 15.0 g/dL   HCT 56.8 63.9 - 53.9 %   MCV 97.0 78.0 - 100.0 fl   MCHC 33.8 30.0 - 36.0 g/dL   RDW 86.6 88.4 - 84.4 %   Platelets 241.0 150.0 - 400.0 K/uL   Neutrophils Relative % 57.0 43.0 - 77.0 %   Lymphocytes Relative 27.7 12.0 - 46.0 %   Monocytes Relative 11.0 3.0 - 12.0 %   Eosinophils Relative 3.5 0.0 - 5.0 %   Basophils Relative 0.8 0.0 - 3.0 %   Neutro Abs 4.1 1.4 - 7.7 K/uL   Lymphs Abs 2.0 0.7 - 4.0 K/uL   Monocytes Absolute 0.8 0.1 - 1.0 K/uL   Eosinophils Absolute 0.2 0.0 - 0.7 K/uL   Basophils Absolute 0.1 0.0 - 0.1 K/uL  Comprehensive metabolic panel   Collection Time: 01/25/24 10:29 AM  Result Value Ref Range   Sodium 139 135 - 145 mEq/L   Potassium 3.9 3.5 - 5.1 mEq/L   Chloride 102 96 - 112 mEq/L   CO2 30 19 - 32 mEq/L   Glucose, Bld 101 (H) 70 - 99 mg/dL   BUN 15 6 - 23 mg/dL   Creatinine, Ser 9.22 0.40 - 1.20 mg/dL   Total Bilirubin 0.5 0.2 - 1.2 mg/dL   Alkaline Phosphatase 102 39 - 117 U/L   AST 23 0 - 37 U/L   ALT 12 0 - 35 U/L   Total Protein 6.6 6.0 - 8.3 g/dL   Albumin 4.2 3.5 - 5.2 g/dL   GFR 25.08 >39.99 mL/min   Calcium  9.3 8.4 - 10.5 mg/dL  Lipid panel   Collection Time: 01/25/24 10:29 AM  Result Value Ref Range   Cholesterol 140 0 - 200 mg/dL   Triglycerides 11.9 0.0 - 149.0 mg/dL   HDL 29.49 >60.99 mg/dL   VLDL 82.3 0.0 -  40.0 mg/dL   LDL Cholesterol 52 0 - 99 mg/dL    Total CHOL/HDL Ratio 2    NonHDL 69.16   TSH   Collection Time: 01/25/24 10:29 AM  Result Value Ref Range   TSH 3.48 0.35 - 5.50 uIU/mL    Assessment and Plan  Acute cough Assessment & Plan: Acute, symptoms ongoing greater than 7 to 10 days, concerning for allergies initially now with possible bacterial superinfection.  Patient with low-grade temperature cough productive of purulent sputum in a patient with COPD/bronchiectasis. Will start with initial antibiotic treatment with azithromycin .  Can use cough suppressant at night, continue Delsym during the day.  Given her history of bronchiectasis if she is not improving as expected I encouraged her to follow-up with pulmonary Dr. Annella.  Return and ER precautions provided   TOBACCO ABUSE Assessment & Plan: Encourage smoking cessation.   Other orders -     Azithromycin ; Take 2 tablets on day 1, then 1 tablet daily on days 2 through 5  Dispense: 6 tablet; Refill: 0 -     guaiFENesin-Codeine; Take 5 mLs by mouth every 6 (six) hours as needed for cough.  Dispense: 100 mL; Refill: 0    Return if symptoms worsen or fail to improve.   Greig Ring, MD

## 2024-08-16 ENCOUNTER — Other Ambulatory Visit: Payer: Self-pay | Admitting: Family Medicine

## 2024-08-16 NOTE — Telephone Encounter (Unsigned)
 Copied from CRM #[id number]. Topic: Clinical - Medication Refill >> Aug 16, 2024  3:14 PM Tysheama G wrote: Medication: rosuvastatin  (CRESTOR ) 10 MG tablet  Has the patient contacted their pharmacy? Yes (Agent: If no, request that the patient contact the pharmacy for the refill. If patient does not wish to contact the pharmacy document the reason why and proceed with request.) (Agent: If yes, when and what did the pharmacy advise?)  This is the patient's preferred pharmacy:  [facility] - [place], [place] - [address] [place] [address] Phone: [phone] Fax: [phone]    Is this the correct pharmacy for this prescription? Yes If no, delete pharmacy and type the correct one.   Has the prescription been filled recently? No  Is the patient out of the medication? Yes  Has the patient been seen for an appointment in the last year OR does the patient have an upcoming appointment? Yes  Can we respond through MyChart? Yes  Agent: Please be advised that Rx refills may take up to 3 business days. We ask that you follow-up with your pharmacy.

## 2024-08-17 MED ORDER — ROSUVASTATIN CALCIUM 10 MG PO TABS: 10.0000 mg | ORAL_TABLET | Freq: Every day | ORAL | 3 refills | Status: AC

## 2024-08-17 NOTE — Telephone Encounter (Signed)
 This medication prescribed from different provider. Please advise.

## 2024-10-13 ENCOUNTER — Other Ambulatory Visit: Payer: Self-pay | Admitting: Family Medicine

## 2024-10-13 DIAGNOSIS — Z1231 Encounter for screening mammogram for malignant neoplasm of breast: Secondary | ICD-10-CM

## 2024-10-17 ENCOUNTER — Inpatient Hospital Stay: Admission: RE | Admit: 2024-10-17 | Discharge: 2024-10-17 | Attending: Family Medicine | Admitting: Family Medicine

## 2024-10-17 DIAGNOSIS — Z1231 Encounter for screening mammogram for malignant neoplasm of breast: Secondary | ICD-10-CM

## 2024-10-19 ENCOUNTER — Ambulatory Visit: Payer: Self-pay | Admitting: Family Medicine

## 2025-01-05 ENCOUNTER — Ambulatory Visit
